# Patient Record
Sex: Female | Born: 1940 | Race: White | Hispanic: No | Marital: Married | Smoking: Former smoker
Health system: Southern US, Community
[De-identification: ages and names within clinical notes are randomized; demographics above are authoritative.]

## PROBLEM LIST (undated history)

## (undated) DIAGNOSIS — I00 Rheumatic fever without heart involvement: Secondary | ICD-10-CM

## (undated) DIAGNOSIS — K219 Gastro-esophageal reflux disease without esophagitis: Secondary | ICD-10-CM

## (undated) DIAGNOSIS — I10 Essential (primary) hypertension: Secondary | ICD-10-CM

## (undated) DIAGNOSIS — Z8679 Personal history of other diseases of the circulatory system: Secondary | ICD-10-CM

## (undated) DIAGNOSIS — M6282 Rhabdomyolysis: Secondary | ICD-10-CM

## (undated) DIAGNOSIS — K851 Biliary acute pancreatitis without necrosis or infection: Secondary | ICD-10-CM

## (undated) DIAGNOSIS — J45909 Unspecified asthma, uncomplicated: Secondary | ICD-10-CM

## (undated) DIAGNOSIS — K8591 Acute pancreatitis with uninfected necrosis, unspecified: Secondary | ICD-10-CM

## (undated) DIAGNOSIS — F32A Depression, unspecified: Secondary | ICD-10-CM

## (undated) DIAGNOSIS — E785 Hyperlipidemia, unspecified: Secondary | ICD-10-CM

## (undated) DIAGNOSIS — I5022 Chronic systolic (congestive) heart failure: Secondary | ICD-10-CM

## (undated) DIAGNOSIS — G629 Polyneuropathy, unspecified: Secondary | ICD-10-CM

## (undated) DIAGNOSIS — F329 Major depressive disorder, single episode, unspecified: Secondary | ICD-10-CM

## (undated) DIAGNOSIS — J449 Chronic obstructive pulmonary disease, unspecified: Secondary | ICD-10-CM

## (undated) DIAGNOSIS — N189 Chronic kidney disease, unspecified: Secondary | ICD-10-CM

## (undated) DIAGNOSIS — I509 Heart failure, unspecified: Secondary | ICD-10-CM

## (undated) DIAGNOSIS — E119 Type 2 diabetes mellitus without complications: Secondary | ICD-10-CM

## (undated) DIAGNOSIS — I4510 Unspecified right bundle-branch block: Secondary | ICD-10-CM

## (undated) DIAGNOSIS — I350 Nonrheumatic aortic (valve) stenosis: Secondary | ICD-10-CM

## (undated) HISTORY — DX: Major depressive disorder, single episode, unspecified: F32.9

## (undated) HISTORY — DX: Hyperlipidemia, unspecified: E78.5

## (undated) HISTORY — DX: Essential (primary) hypertension: I10

## (undated) HISTORY — DX: Acute pancreatitis with uninfected necrosis, unspecified: K85.91

## (undated) HISTORY — DX: Type 2 diabetes mellitus without complications: E11.9

## (undated) HISTORY — DX: Gastro-esophageal reflux disease without esophagitis: K21.9

## (undated) HISTORY — PX: PANCREATIC PSEUDOCYST DRAINAGE: SHX2158

## (undated) HISTORY — DX: Polyneuropathy, unspecified: G62.9

## (undated) HISTORY — DX: Rhabdomyolysis: M62.82

## (undated) HISTORY — DX: Personal history of other diseases of the circulatory system: Z86.79

## (undated) HISTORY — PX: CHOLECYSTECTOMY: SHX55

## (undated) HISTORY — DX: Depression, unspecified: F32.A

## (undated) HISTORY — PX: OTHER SURGICAL HISTORY: SHX169

## (undated) HISTORY — DX: Chronic systolic (congestive) heart failure: I50.22

## (undated) HISTORY — DX: Rheumatic fever without heart involvement: I00

## (undated) HISTORY — DX: Biliary acute pancreatitis without necrosis or infection: K85.10

---

## 2003-07-18 ENCOUNTER — Ambulatory Visit (HOSPITAL_COMMUNITY): Admission: RE | Admit: 2003-07-18 | Discharge: 2003-07-18 | Payer: Self-pay | Admitting: Family Medicine

## 2004-04-17 ENCOUNTER — Inpatient Hospital Stay (HOSPITAL_COMMUNITY): Admission: AD | Admit: 2004-04-17 | Discharge: 2004-04-23 | Payer: Self-pay | Admitting: Family Medicine

## 2004-04-17 ENCOUNTER — Ambulatory Visit: Payer: Self-pay | Admitting: Internal Medicine

## 2004-04-21 HISTORY — PX: ESOPHAGOGASTRODUODENOSCOPY: SHX1529

## 2004-05-18 ENCOUNTER — Ambulatory Visit (HOSPITAL_COMMUNITY): Admission: RE | Admit: 2004-05-18 | Discharge: 2004-05-18 | Payer: Self-pay | Admitting: Family Medicine

## 2004-06-01 ENCOUNTER — Emergency Department (HOSPITAL_COMMUNITY): Admission: EM | Admit: 2004-06-01 | Discharge: 2004-06-01 | Payer: Self-pay | Admitting: Emergency Medicine

## 2004-07-23 ENCOUNTER — Emergency Department (HOSPITAL_COMMUNITY): Admission: EM | Admit: 2004-07-23 | Discharge: 2004-07-23 | Payer: Self-pay | Admitting: Emergency Medicine

## 2004-11-26 ENCOUNTER — Encounter (HOSPITAL_BASED_OUTPATIENT_CLINIC_OR_DEPARTMENT_OTHER): Admission: RE | Admit: 2004-11-26 | Discharge: 2005-02-24 | Payer: Self-pay | Admitting: Surgery

## 2008-05-16 ENCOUNTER — Ambulatory Visit (HOSPITAL_COMMUNITY): Admission: RE | Admit: 2008-05-16 | Discharge: 2008-05-16 | Payer: Self-pay | Admitting: Family Medicine

## 2008-06-04 ENCOUNTER — Ambulatory Visit (HOSPITAL_COMMUNITY): Admission: RE | Admit: 2008-06-04 | Discharge: 2008-06-04 | Payer: Self-pay | Admitting: Family Medicine

## 2008-11-22 ENCOUNTER — Ambulatory Visit (HOSPITAL_COMMUNITY): Admission: RE | Admit: 2008-11-22 | Discharge: 2008-11-22 | Payer: Self-pay | Admitting: Family Medicine

## 2009-10-03 ENCOUNTER — Ambulatory Visit (HOSPITAL_COMMUNITY): Admission: RE | Admit: 2009-10-03 | Discharge: 2009-10-03 | Payer: Self-pay | Admitting: Family Medicine

## 2009-10-03 ENCOUNTER — Inpatient Hospital Stay (HOSPITAL_COMMUNITY): Admission: EM | Admit: 2009-10-03 | Discharge: 2009-10-05 | Payer: Self-pay | Admitting: Emergency Medicine

## 2010-06-27 ENCOUNTER — Encounter: Payer: Self-pay | Admitting: Internal Medicine

## 2010-08-25 LAB — GLUCOSE, CAPILLARY
Glucose-Capillary: 137 mg/dL — ABNORMAL HIGH (ref 70–99)
Glucose-Capillary: 154 mg/dL — ABNORMAL HIGH (ref 70–99)
Glucose-Capillary: 232 mg/dL — ABNORMAL HIGH (ref 70–99)
Glucose-Capillary: 241 mg/dL — ABNORMAL HIGH (ref 70–99)
Glucose-Capillary: 274 mg/dL — ABNORMAL HIGH (ref 70–99)
Glucose-Capillary: 328 mg/dL — ABNORMAL HIGH (ref 70–99)
Glucose-Capillary: 400 mg/dL — ABNORMAL HIGH (ref 70–99)

## 2010-08-25 LAB — BASIC METABOLIC PANEL
BUN: 21 mg/dL (ref 6–23)
CO2: 32 mEq/L (ref 19–32)
Calcium: 8.7 mg/dL (ref 8.4–10.5)
Chloride: 104 mEq/L (ref 96–112)
Creatinine, Ser: 0.59 mg/dL (ref 0.4–1.2)
GFR calc Af Amer: >60 mL/min (ref >60)
GFR calc non Af Amer: >60 mL/min (ref >60)
Glucose, Bld: 133 mg/dL — ABNORMAL HIGH (ref 70–99)
Potassium: 4 mEq/L (ref 3.5–5.1)
Sodium: 141 mEq/L (ref 135–145)

## 2010-08-25 LAB — CBC
HCT: 36.1 % (ref 36.0–46.0)
HCT: 35 % — ABNORMAL LOW (ref 36.0–46.0)
HCT: 35.1 % — ABNORMAL LOW (ref 36.0–46.0)
Hemoglobin: 12 g/dL (ref 12.0–15.0)
Hemoglobin: 11.9 g/dL — ABNORMAL LOW (ref 12.0–15.0)
Hemoglobin: 12 g/dL (ref 12.0–15.0)
MCHC: 33.3 g/dL (ref 30.0–36.0)
MCHC: 34 g/dL (ref 30.0–36.0)
MCV: 81.8 fL (ref 78.0–100.0)
MCV: 80.6 fL (ref 78.0–100.0)
MCV: 80.6 fL (ref 78.0–100.0)
Platelets: 256 10*3/uL (ref 150–400)
Platelets: 216 10*3/uL (ref 150–400)
Platelets: 217 10*3/uL (ref 150–400)
RBC: 4.41 MIL/uL (ref 3.87–5.11)
RBC: 4.34 MIL/uL (ref 3.87–5.11)
RDW: 17.1 % — ABNORMAL HIGH (ref 11.5–15.5)
RDW: 17 % — ABNORMAL HIGH (ref 11.5–15.5)
WBC: 14.9 10*3/uL — ABNORMAL HIGH (ref 4.0–10.5)
WBC: 11.9 10*3/uL — ABNORMAL HIGH (ref 4.0–10.5)
WBC: 9.9 10*3/uL (ref 4.0–10.5)

## 2010-08-25 LAB — COMPREHENSIVE METABOLIC PANEL
ALT: 20 U/L (ref 0–35)
ALT: 22 U/L (ref 0–35)
AST: 16 U/L (ref 0–37)
AST: 17 U/L (ref 0–37)
Albumin: 3.3 g/dL — ABNORMAL LOW (ref 3.5–5.2)
Albumin: 3.4 g/dL — ABNORMAL LOW (ref 3.5–5.2)
Alkaline Phosphatase: 62 U/L (ref 39–117)
Alkaline Phosphatase: 71 U/L (ref 39–117)
BUN: 21 mg/dL (ref 6–23)
CO2: 31 mEq/L (ref 19–32)
CO2: 33 mEq/L — ABNORMAL HIGH (ref 19–32)
Calcium: 9 mg/dL (ref 8.4–10.5)
Chloride: 100 mEq/L (ref 96–112)
Chloride: 104 mEq/L (ref 96–112)
Creatinine, Ser: 0.65 mg/dL (ref 0.4–1.2)
Creatinine, Ser: 0.68 mg/dL (ref 0.4–1.2)
GFR calc Af Amer: >60 mL/min (ref >60)
GFR calc Af Amer: >60 mL/min (ref >60)
GFR calc non Af Amer: >60 mL/min (ref >60)
GFR calc non Af Amer: >60 mL/min (ref >60)
Glucose, Bld: 152 mg/dL — ABNORMAL HIGH (ref 70–99)
Potassium: 4.2 mEq/L (ref 3.5–5.1)
Potassium: 4.5 mEq/L (ref 3.5–5.1)
Sodium: 138 mEq/L (ref 135–145)
Total Bilirubin: 0.4 mg/dL (ref 0.3–1.2)
Total Bilirubin: 0.5 mg/dL (ref 0.3–1.2)
Total Protein: 7.4 g/dL (ref 6.0–8.3)

## 2010-08-25 LAB — BLOOD GAS, ARTERIAL
Acid-Base Excess: 2.9 mmol/L — ABNORMAL HIGH (ref 0.0–2.0)
TCO2: 24.2 mmol/L (ref 0–100)
pCO2 arterial: 46.1 mmHg — ABNORMAL HIGH (ref 35.0–45.0)
pO2, Arterial: 74.1 mmHg — ABNORMAL LOW (ref 80.0–100.0)

## 2010-08-25 LAB — DIFFERENTIAL
Basophils Absolute: 0 10*3/uL (ref 0.0–0.1)
Basophils Absolute: 0 10*3/uL (ref 0.0–0.1)
Basophils Absolute: 0 10*3/uL (ref 0.0–0.1)
Basophils Relative: 0 % (ref 0–1)
Basophils Relative: 0 % (ref 0–1)
Basophils Relative: 0 % (ref 0–1)
Eosinophils Absolute: 0 10*3/uL (ref 0.0–0.7)
Eosinophils Absolute: 0 10*3/uL (ref 0.0–0.7)
Eosinophils Absolute: 0 10*3/uL (ref 0.0–0.7)
Eosinophils Relative: 0 % (ref 0–5)
Eosinophils Relative: 0 % (ref 0–5)
Eosinophils Relative: 0 % (ref 0–5)
Lymphocytes Relative: 5 % — ABNORMAL LOW (ref 12–46)
Lymphocytes Relative: 15 % (ref 12–46)
Lymphocytes Relative: 7 % — ABNORMAL LOW (ref 12–46)
Lymphs Abs: 0.8 10*3/uL (ref 0.7–4.0)
Lymphs Abs: 1.7 10*3/uL (ref 0.7–4.0)
Monocytes Absolute: 0.4 10*3/uL (ref 0.1–1.0)
Monocytes Absolute: 0.2 10*3/uL (ref 0.1–1.0)
Monocytes Absolute: 0.7 10*3/uL (ref 0.1–1.0)
Monocytes Relative: 3 % (ref 3–12)
Monocytes Relative: 6 % (ref 3–12)
Neutro Abs: 13.8 10*3/uL — ABNORMAL HIGH (ref 1.7–7.7)
Neutro Abs: 9.4 10*3/uL — ABNORMAL HIGH (ref 1.7–7.7)
Neutrophils Relative %: 92 % — ABNORMAL HIGH (ref 43–77)
Neutrophils Relative %: 79 % — ABNORMAL HIGH (ref 43–77)

## 2010-08-25 LAB — CK TOTAL AND CKMB (NOT AT ARMC)
CK, MB: 2.1 ng/mL (ref 0.3–4.0)
CK, MB: 5.7 ng/mL — ABNORMAL HIGH (ref 0.3–4.0)
Relative Index: INVALID (ref 0.0–2.5)
Total CK: 95 U/L (ref 7–177)

## 2010-08-25 LAB — BRAIN NATRIURETIC PEPTIDE: Pro B Natriuretic peptide (BNP): 114 pg/mL — ABNORMAL HIGH (ref 0.0–100.0)

## 2010-08-25 LAB — TROPONIN I
Troponin I: 0.1 ng/mL — ABNORMAL HIGH (ref 0.00–0.06)
Troponin I: 0.2 ng/mL — ABNORMAL HIGH (ref 0.00–0.06)

## 2010-08-25 LAB — PROTIME-INR
INR: 1.02 (ref 0.00–1.49)
Prothrombin Time: 13.3 seconds (ref 11.6–15.2)
Prothrombin Time: 14.1 seconds (ref 11.6–15.2)

## 2010-08-25 LAB — APTT: aPTT: 27 seconds (ref 24–37)

## 2010-08-25 LAB — CARDIAC PANEL(CRET KIN+CKTOT+MB+TROPI)
CK, MB: 6.1 ng/mL (ref 0.3–4.0)
CK, MB: 9.7 ng/mL (ref 0.3–4.0)
Total CK: 291 U/L — ABNORMAL HIGH (ref 7–177)
Total CK: 542 U/L — ABNORMAL HIGH (ref 7–177)

## 2010-08-25 LAB — LIPID PANEL
HDL: 51 mg/dL (ref >39)
Total CHOL/HDL Ratio: 2.8 RATIO
Triglycerides: 138 mg/dL (ref <150)
VLDL: 28 mg/dL (ref 0–40)

## 2010-08-25 LAB — D-DIMER, QUANTITATIVE: D-Dimer, Quant: 0.44 ug/mL-FEU (ref 0.00–0.48)

## 2010-08-25 LAB — POCT CARDIAC MARKERS

## 2010-10-23 NOTE — Consult Note (Signed)
NAMEREGENIA, ERCK               ACCOUNT NO.:  [id number]   MEDICAL RECORD NO.:  [id number]           PATIENT TYPE:   LOCATION:                                 FACILITY:   PHYSICIAN:  Theresia Majors. Tanda Rockers, M.D.DATE OF BIRTH:  Jan 28, 1941   DATE OF CONSULTATION:  11/27/2004  DATE OF DISCHARGE:                                   CONSULTATION   REASON FOR CONSULTATION:  Kathryn Morgan is a 70 year old female referred by  Dr. Regino Schultze for evaluation of a nonhealing ulcer of her right buttock.   IMPRESSION:  A healing stasis ulcer, right buttock with a sinus tract.   RECOMMENDATION/PROCEDURE:  An incision opening and debridement of the sinus  tract was performed in the wound clinic and a packing of Iodoform gauze was  performed.  The daughter was instructed in wound care.  The patient will be  followed in the wound care clinic in 5-7 days.   SUBJECTIVE:  Kathryn Morgan is a 70 year old lady, who has had multiple medical  problems of extreme severity over the past 8 months.  She initially had an  episode of biliary colic which became complicated by pancreatitis, sepsis,  and multisystems organ failure requiring prolonged hospitalization and  ventilatory support.  She also experienced acute renal failure requiring  temporary dialysis.  During the course of these grave illnesses, she was in  the fore supine position and developed a decubitus ulcer.  Following her  partial recovery and return to outpatient status, she had her hospital-  acquired decubitus ulcer managed by the home health nurse.  This wound  healed nicely but has now been associated with a sinus tract.   PAST MEDICAL HISTORY:  Remarkable for diabetes.  She denies allergies. Her.   CURRENT MEDICATIONS:  1.  Oxygen administration daily and continuously.  2.  Zoloft 100 mg daily.  3.  Reglan 5 mg q.12h.  4.  Metoprolol 75 mg b.i.d.  5.  Mirtazapine 15 mg daily.  6.  Lantus 12 units h.s.  7.  Colace 100 mg b.i.d.  8.  Cardizem 120  mg daily.  9.  Lisinopril 5 mg daily.  10. __________ 4 mg q.6h.  11. Xanax 0.5 mg q.6h.   PAST SURGERIES:  Procedures related to her recent illness.  Pancreatic  stents have been placed, and she has had a percutaneous drainage of a  pancreatic pseudocyst.   FAMILY HISTORY:  Positive for hypertension, diabetes.   SOCIAL HISTORY:  She is married.  She has two children and lives in  [place].   REVIEW OF SYSTEMS:  Related to her current convalescence from the  multisystems organ failure.  Prior to this episode, she was in good health  with no ongoing illnesses except for diabetes.   PHYSICAL EXAM:  GENERAL:  She is alert, pleasant female in no acute  distress.  She has continuous administration of nasal oxygen by prongs.  LUNGS:  Clear.  ABDOMEN:  Soft.  CARDIAC:  The heart sounds were distant.  EXTREMITIES:  Warm on the buttocks.  There is a sinus tract on the medial  aspect of the right buttock.  This extends down 4 cm and is associated with  some serosanguineous drainage on a Q-tip.  NEUROLOGIC:  The patient's protective sensation is intact.   DISCUSSION:  The sinus tract was opened and packing initiated.  We  anticipate that this should heal without complication.  We will follow the  patient in [facility] to achieve resolution.           ______________________________  Theresia Majors Tanda Rockers, M.D.     Cephus Slater  D:  11/27/2004  T:  11/28/2004  Job:  [id number]   cc:   [name], M.D.  [address]  [place]  [place] [address]  Fax: [phone]

## 2010-10-23 NOTE — Discharge Summary (Signed)
NAMEKELBY, Kathryn Morgan               ACCOUNT NO.:  [id number]   MEDICAL RECORD NO.:  [id number]          PATIENT TYPE:  INP   LOCATION:  A312                          FACILITY:  [facility]   PHYSICIAN:  [name], M.D.DATE OF BIRTH:  1940-06-29   DATE OF ADMISSION:  04/17/2004  DATE OF DISCHARGE:  11/18/2005LH                                 DISCHARGE SUMMARY   DISCHARGE DIAGNOSES:  1.  Retractable vomiting.  2.  Pancreatitis with enlarging pseudocyst.  3.  Cholelithiasis, symptomatic.  4.  Type 2 diabetes.  5.  Tachycardia.  6.  Intensive care unit neuropathy, lower extremities.  7.  Deconditioning.  8.  History of anemia.  9.  Status post ventilator dependency.  10. Status post renal failure secondary to rhabdomyolysis.  11. History of coronary artery disease.   HOSPITAL COURSE:  This 70 year old female was admitted in transfer from  [facility] after an arduous course which began in [place] on an  airplane with abdominal pain.  The patient underwent hospitalization for  pancreatitis with subsequent ventilator dependency, rhabdomyolysis and renal  failure.  She was weaned off the ventilator several weeks ago, but has been  having difficult tolerating p.o. without vomiting.  She is noted to have  symptomatic gallstones as well as an enlarging pseudocyst.  For this reason,  she was admitted.  The patient was found to have a white count of 8400 with  a hemoglobin 9.7.  Electrolytes showed a slightly low sodium of 131 on  admission, 134 at the time of discharge, otherwise potassium, BUN and  creatinine have been within normal limits.  The patient's blood sugars have  been difficult to control due to the fact that early on she was getting D-5-  1/2 normal in her IV and her TPN has been increased.  Currently, she is  receiving 36 units of Lantus with sliding scale.  Blood sugar on the morning  of admission was 400.  Her TPN was increased yesterday.  Recommend  significant  increase in her Lantus and continue sliding scale coverage.   The patient's remaining labs showed an elevated Alk phos throughout her stay  with 229 on admission and 218 at discharge.  Her SGOT was elevated on  admission at 61 and is normal now at 32.  SGPT was slightly elevated at 49,  now normal at 3.5.  Protein was within normal range at 6.1 at the time of  discharge.  Albumin is low at 2.1.  The patient's amylase and lipase were  normal on admission.  Cholesterol was 151 on admission with triglycerides of  326 and has improved to 244 now.   The patient has remained afebrile throughout her stay.  She has had some  episodes of tachycardia related to inability to tolerate her p.o.  medications which include Lopressor.  The patient was seen by Dr. Lovell Sheehan  for surgery as well as GI.  GI attempted to do a transgastric drainage of  her pseudocyst without success.  For that reason, she will be transferred to  [facility] for definitive drainage of that cyst.  Hopefully,  correction of her gallbladder disease, also.  The patient's CT of the  abdomen showed left effusion, a lesion in her left mid lung zone.  Followup  CT scan of that was consistent with post inflammatory changes.  She does  have loculated fluid in her pancreas area and cholelithiasis with ductal  dilatation.  CT of the pelvis was negative.   The patient's respiratory status has been stable throughout her stay.  She  is still having difficulty tolerating p.o.  She is transferred now to  [facility].     Will   WMM/MEDQ  D:  04/23/2004  T:  04/23/2004  Job:  [id number]

## 2010-10-23 NOTE — Consult Note (Signed)
NAMEJARED, CAHN               ACCOUNT NO.:  [id number]   MEDICAL RECORD NO.:  [id number]          PATIENT TYPE:  INP   LOCATION:  A312                          FACILITY:  [facility]   PHYSICIAN:  R. Roetta Sessions, M.D. DATE OF BIRTH:  1940-09-27   DATE OF CONSULTATION:  04/20/2004  DATE OF DISCHARGE:                                   CONSULTATION   REASON FOR CONSULTATION:  Pseudocyst.   HISTORY OF PRESENT ILLNESS:  Ms. Top is a 70 year old Caucasian female  who developed acute pancreatitis around August 19 while she was on vacation.  She was admitted to the hospital in [place] secondary to developing  pancreatitis.  Her course was complicated by renal failure, rhabdomyolysis,  and acute respiratory failure.  She ended up on the ventilator.  Per her  request, she was transferred to [facility] in [place] in mid  October.  She continued to complain of nausea, vomiting, and abdominal pain.  She was found to have pancreatic pseudocyst.  CT scan from April 18, 2004, revealed extensive loculated fluid in left upper quadrant lesser sac  and along the anterior perirenal face.  She was also found to have  cholelithiasis and a mild intrahepatic central biliary ductal dilatation.  She also was noted to have mild splenomegaly.   Today she is afebrile.  She denies any abdominal pain at this time.  She  does have persistent nausea and intermittent vomiting.  She denies any  constipation or diarrhea.  She denies any heartburn, indigestion, or  dyspepsia.   PAST MEDICAL HISTORY:  1.  Diabetes mellitus.  2.  Acute renal failure which is now resolved.  3.  Coronary artery disease.  4.  Anemia, ICU.  5.  Neuropathy.  6.  Acute gallstone pancreatitis January 24, 2004.  7.  Rhabdomyolysis.  8.  Acute respiratory failure.   MEDICATIONS PRIOR TO ADMISSION:  1.  Lantus 20 units daily.  2.  Atrovent nebulizer q.12h. p.r.n.  3.  Lasix daily.  4.  Zofran p.r.n. nausea.  5.   Clonazepam 0.125 mg p.r.n.  6.  Stool softener as needed.  7.  Zoloft 25 mg daily.  8.  Sorbic acid daily.  9.  Enalapril 5 mg b.i.d.  10. Prevacid 30 mg daily.  11. Thiamine 100 mg daily.  12. Metoprolol 100 mg b.i.d.  13. Clonidine 0.3 mg q.8h.  14. Cardizem 60 mg q.6h.  15. TPN   ALLERGIES:  CODEINE.   FAMILY HISTORY:  No known family history of colorectal carcinoma, liver or  chronic GI problems.  Mother, age 73, alive with history of osteoporosis.  Father deceased at age 90 secondary to CVA.  She has one brother and one  sister, both of whom are relatively healthy.   SOCIAL HISTORY:  Ms. Miggins has been married for 36 years.  She reports a  15-pack-year tobacco use history, quitting 15 years ago.  She reported  occasional glass of wine prior to hospitalization.  She has two grown and  healthy children.  She denied any drug use.  She is a retired Investment banker, corporate.  REVIEW OF SYSTEMS:  CONSTITUTIONAL:  Weight stabilized since her acute  illness.  She denies any fever or chills.  Appetite has been somewhat  decreased given her symptoms.  CARDIOVASCULAR:  Denies chest pain or  palpitations.  LUNGS:  Denies shortness of breath, dyspnea, cough,  hemoptysis.  GI:  Denies any dysphagia or odynophagia.   PHYSICAL EXAMINATION:  VITAL SIGNS:  Weight 175.6 pounds, height 69.  Temperature 97.2, pulse 99, respirations 20, blood pressure 144/69.  GENERAL:  Ms. Glaspy is a pale-appearing Caucasian female who is alert and  oriented, pleasant, cooperative, and in no acute distress, although appears  weak.  She is accompanied by her daughter today.  HEENT:  Sclerae clear, nonicteric.  Conjunctivae pale.  Oropharynx pink and  moist without any lesion.  NECK:  Supple without thyromegaly.  HEART:  Regular rate and rhythm with normal S1 and S2.  LUNGS:  Clear bilaterally.  ABDOMEN:  Positive bowel sounds x 4.  No bruits auscultated.  Soft, mildly  distended, nontender, and without palpable mass  or hepatosplenomegaly.  No  rebound tenderness or guarding.  GU:  She does have Foley intact with clear yellow urine to straight drain.  EXTREMITIES:  1+ pedal pulses bilaterally.  Trace edema bilaterally.   LABORATORY AND X-RAY DATA:  I's and O's +431.  WBC 9.6, hemoglobin 9.6,  hematocrit 28.3, platelets 400.  Calcium 9.3, sodium 131, potassium 4.4,  chloride 102, CO2 25, BUN 9, creatinine 0.4, glucose 284, total bilirubin  0.5, alkaline phosphatase 224, SGOT 47, SGPT 43, total protein 6.4, albumin  2.1, phosphorus 4.4, magnesium 1.6, cholesterol 151, triglycerides 326,  amylase 73, lipase 29.   CT scan as dictated in HPI.   IMPRESSION:  Ms. Skilton is a 70 year old Caucasian female with complicated  history due to acute gallstone pancreatitis.  She has now developed  significant pseudocyst which will need to be drained.  Dr. Jena Gauss has  reviewed CT scan and will plan for endoscopic transgastric pseudocyst  drainage under fluoroscopy tomorrow.  This has been discussed with Ms.  Sibyl Parr, and Dr. Jena Gauss will also visit her this evening as well.   RECOMMENDATIONS:  1.  Plan for endoscopy transgastric pseudocyst drainage under fluoroscopy      tomorrow by Dr. Jena Gauss.  Will cover      her with antibiotics, Levaquin 500 mg IV preoperatively.  2.  Continue current medications for now.  3.  Further recommendations pending procedure.     Kand   KC/MEDQ  D:  04/20/2004  T:  04/20/2004  Job:  [id number]   cc:   [name], M.D.  [address]  [place]  [place] [address]  Fax: [phone]

## 2010-10-23 NOTE — Procedures (Signed)
Kathryn Morgan, Kathryn Morgan               ACCOUNT NO.:  [id number]   MEDICAL RECORD NO.:  [id number]          PATIENT TYPE:  OUT   LOCATION:  RESP                          FACILITY:  [facility]   PHYSICIAN:  Edward L. Juanetta Gosling, M.D.DATE OF BIRTH:  08/17/1940   DATE OF PROCEDURE:  DATE OF DISCHARGE:  06/04/2008                            PULMONARY FUNCTION TEST   1. Spirometry shows a severe ventilatory defect with evidence of      airflow obstruction.  2. Lung volumes show no restrictive abnormality, but do show air      trapping.  3. DLCO is moderately reduced.  4. Arterial blood gases show normal oxygenation and a slightly      elevated pCO2 suggesting chronic hypoventilation.  5. There is no significant bronchodilator improvement.      Edward L. Juanetta Gosling, M.D.  Electronically Signed     ELH/MEDQ  D:  06/06/2008  T:  06/06/2008  Job:  [id number]   cc:   Patrica Duel, M.D.  Fax: [phone]

## 2010-10-23 NOTE — Op Note (Signed)
Kathryn Morgan, Kathryn Morgan               ACCOUNT NO.:  [id number]   MEDICAL RECORD NO.:  [id number]          PATIENT TYPE:  INP   LOCATION:  A312                          FACILITY:  [facility]   PHYSICIAN:  R. Roetta Sessions, M.D. DATE OF BIRTH:  12-17-1940   DATE OF PROCEDURE:  04/21/2004  DATE OF DISCHARGE:                                 OPERATIVE REPORT   PROCEDURE:  Diagnostic esophagogastroduodenoscopy.   INDICATION FOR PROCEDURE:  The patient is a 70 year old lady with a long,  protracted course of complicated pancreatitis.  She has now recovered  significantly.  She has had postprandial nausea and vomiting.  CT  demonstrated a large pseudocyst impinging on the stomach inferiorly and  posteriorly.  We discussed the approach of EGD with possible transgastric  drainage/stent placement.  I have discussed the approach with Dr. Wallis Bamberg of [facility] and discussed the potential risks, benefits, and  alternatives with Kathryn Morgan.  The potential for failed procedure was fully  explained.  All parties were agreeable.  Please see the documentation in the  medical record.   PROCEDURE NOTE:  O2 saturation, blood pressure, pulse, and respirations were  monitored throughout the entire procedure.   CONSCIOUS SEDATION:  IV Versed and Demerol in incremental doses.   INSTRUMENT USED:  Olympus video chip system.   FINDINGS:  Examination of the tubular esophagus revealed no mucosal  abnormalities.  The EG junction was easily traversed.  The stomach  insufflated well with air.  Surprisingly, there was not a significant bulge  on gastric wall.  There was a subtle-appearing bulge on the posterior lesser  curvature; however, the stomach even there insufflated fairly well, and  there was no obvious location for a gastrotomy.  Overlying mucosa appeared  normal.  We turned the patient from semiprone to left lateral decubitus to  supine and really did not get a change in the perspective of the  pseudocyst  in relation to the stomach.  Pylorus was patent and easily traversed.  Examination of the bulb and second portion revealed no abnormalities.   I did pull the scope back into the stomach and advance the sclero needle  through the area where there was a subtle fullness in the gastric wall and  was unable to aspirate anything.  I elected not to make a blind gastrotomy  given there was not more of a frank bulge.  The patient tolerated the  procedure very well, will react in endoscopy.   IMPRESSION:  1.  Normal esophagus.  2.  Normal gastric mucosa, subtle fullness of the gastric wall as described      above suggestive of the presence of a pseudocyst but certainly not      conclusive for that being the location of the pseudocyst in relation to      the stomach.  3.  Normal D1, D2.   RECOMMENDATIONS:  Will make plans to have the patient go over to [facility] so  she can have an EUS-guided transgastric pseudocyst drainage procedure.  I  have discussed the case with Dr. Wallis Bamberg and Dr. Lovell Sheehan  here.  Will  most likely get her transferred over tomorrow.  Will be discussing this in  more detail with patient  and family in the near future.     Otelia Sergeant   RMR/MEDQ  D:  04/21/2004  T:  04/21/2004  Job:  [id number]   cc:   [name], M.D.  [address]  [place]  [place] [address]  Fax: [phone]   Dalia Heading, M.D.  [address], [place]  [place] [address]  Fax: [phone]

## 2010-10-23 NOTE — H&P (Signed)
NAMEKASHMIR, Kathryn Morgan               ACCOUNT NO.:  [id number]   MEDICAL RECORD NO.:  [id number]          PATIENT TYPE:  INP   LOCATION:  A312                          FACILITY:  [facility]   PHYSICIAN:  [name], M.D.DATE OF BIRTH:  1941-03-26   DATE OF ADMISSION:  04/17/2004  DATE OF DISCHARGE:  LH                                HISTORY & PHYSICAL   CHIEF COMPLAINT:  Vomiting, abdominal pain.   PRESENTING ILLNESS:  This is a 70 year old lady who has had a long, arduous  course with her illness.  The patient was vacationing in [place] on  August 19 of this year.  She was admitted to an [facility] with  abdominal pain.  At that time she was found to have acute pancreatitis.  Her  condition continued to worsen.  She developed renal failure, rhabdomyolysis,  and respiratory failure.  The patient was placed on mechanical ventilator  and had a very slow course weaning.  Her renal failure recovered, but she  continued to have difficulty on the vent.  She was transferred to [facility] for ventilator support on October 16.  While at [facility],  she had been weaned off the ventilator.  She was tolerating a regular diet  until approximately a week ago, began having nausea and vomiting and  abdominal pain associated with eating.  TPN was instituted at this time, and  current workup showed enlarging pancreatic pseudocyst as well as thickening  of the gallbladder with multiple stones.  The patient is now transferred to  [facility] for definitive surgery at her request to be closer to home.   The patient's medications at this time are:  1.  Lantus insulin 20 units daily.  2.  She is on ipratropium nebulizer q.12h. p.r.n.  3.  Albuterol nebulizer a.12h. p.r.n.  4.  Lasix 20 mg daily.  5.  Zofran IV for nausea.  6.  Clonazepam 0.25 mg for anxiety p.r.n.  7.  Stool softener.  8.  Zoloft 25 mg daily.  9.  Ascorbic acid.  10. Enalapril 5 mg b.i.d.  11. Prevacid 30 mg  daily.  12. Thiamine 100 mg daily.  13. Metoprolol 100 mg b.i.d.  14. Clonidine 0.3 mg q.8h.  15. Cardizem 60 mg q.6h.  16. She gets TPN at 84 mL/hr.  17. Innohep 13 units daily.   REVIEW OF SYSTEMS:  She denies significant shortness of breath.  She denies  chest pain.  At this time she denies any abdominal pain, nausea or vomiting  or diarrhea.  The patient is taking her medications p.o. with ice chips and  sips without problems.   PHYSICAL EXAMINATION:  GENERAL:  A well-developed female, alert and  oriented.  VITAL SIGNS:  She is afebrile, her pulse is 90 and regular, respirations 20  and unlabored, blood pressure is 130/60.  HEENT:  TMs normal.  Pupils equal and react to light and accommodation.  Oropharynx benign.  NECK:  Supple without JVD, bruit, or thyromegaly.  CHEST:  Lungs essentially clear in all areas.  CARDIAC:  Regular sinus rhythm without  murmur, gallop, or rub.  ABDOMEN:  Soft, nontender, with normoactive bowel sounds.  EXTREMITIES:  Without clubbing, cyanosis, or edema.  There is a small  healing ulcer in her left heel.  NEUROLOGIC:  Significant diminished strength down her lower extremities  bilaterally.   ASSESSMENT:  1.  Status post ventilator dependency.  2.  Type 2 diabetes.  3.  Chronic recurrent pancreatitis with enlarging pseudocyst.  4.  Cholelithiasis, symptomatic.  5.  Status post renal failure.  6.  History of coronary artery disease.  7.  History of anemia.  8.  Intensive care unit neuropathy.  9.  Deconditioning.     Will   WMM/MEDQ  D:  04/18/2004  T:  04/18/2004  Job:  [id number]

## 2011-03-12 LAB — BLOOD GAS, ARTERIAL
Bicarbonate: 28.9 mEq/L — ABNORMAL HIGH (ref 20.0–24.0)
O2 Content: 21 L/min
TCO2: 25.4 mmol/L (ref 0–100)
pH, Arterial: 7.389 (ref 7.350–7.400)
pO2, Arterial: 74.2 mmHg — ABNORMAL LOW (ref 80.0–100.0)

## 2011-05-07 ENCOUNTER — Other Ambulatory Visit (HOSPITAL_COMMUNITY): Payer: Self-pay | Admitting: Family Medicine

## 2011-05-07 DIAGNOSIS — Z139 Encounter for screening, unspecified: Secondary | ICD-10-CM

## 2011-05-14 ENCOUNTER — Other Ambulatory Visit (HOSPITAL_COMMUNITY): Payer: Self-pay

## 2011-05-14 ENCOUNTER — Ambulatory Visit (HOSPITAL_COMMUNITY): Payer: Self-pay

## 2011-05-17 ENCOUNTER — Encounter: Payer: Self-pay | Admitting: Internal Medicine

## 2011-05-18 ENCOUNTER — Ambulatory Visit (INDEPENDENT_AMBULATORY_CARE_PROVIDER_SITE_OTHER): Payer: Medicare Other | Admitting: Gastroenterology

## 2011-05-18 ENCOUNTER — Encounter: Payer: Self-pay | Admitting: Gastroenterology

## 2011-05-18 DIAGNOSIS — K863 Pseudocyst of pancreas: Secondary | ICD-10-CM

## 2011-05-18 DIAGNOSIS — Z1211 Encounter for screening for malignant neoplasm of colon: Secondary | ICD-10-CM

## 2011-05-18 DIAGNOSIS — B1920 Unspecified viral hepatitis C without hepatic coma: Secondary | ICD-10-CM

## 2011-05-18 DIAGNOSIS — R1013 Epigastric pain: Secondary | ICD-10-CM

## 2011-05-18 LAB — CBC WITH DIFFERENTIAL/PLATELET
Hemoglobin: 12 g/dL (ref 12.0–15.0)
Lymphocytes Relative: 22 % (ref 12–46)
Lymphs Abs: 1.8 10*3/uL (ref 0.7–4.0)
Monocytes Relative: 8 % (ref 3–12)
Neutro Abs: 5.7 10*3/uL (ref 1.7–7.7)
Neutrophils Relative %: 69 % (ref 43–77)
Platelets: 268 10*3/uL (ref 150–400)
RBC: 4.98 MIL/uL (ref 3.87–5.11)
WBC: 8.3 10*3/uL (ref 4.0–10.5)

## 2011-05-18 LAB — HEPATIC FUNCTION PANEL
Albumin: 3.4 g/dL — ABNORMAL LOW (ref 3.5–5.2)
Alkaline Phosphatase: 67 U/L (ref 39–117)
Bilirubin, Direct: 0.1 mg/dL (ref 0.0–0.3)
Indirect Bilirubin: 0.5 mg/dL (ref 0.0–0.9)
Total Bilirubin: 0.6 mg/dL (ref 0.3–1.2)

## 2011-05-18 MED ORDER — PANTOPRAZOLE SODIUM 40 MG PO TBEC: 40.0000 mg | DELAYED_RELEASE_TABLET | Freq: Every day | ORAL | Status: DC

## 2011-05-18 NOTE — Progress Notes (Signed)
Primary Care Physician:  [name], MD Primary Gastroenterologist:  Dr. Jena Gauss  Chief Complaint  Patient presents with  . Gas    alot of pressure/no reflux    HPI:   Kathryn Morgan is a 70 year old female who presents as a self-referral today secondary to abdominal pressure, burping. She was seen by [place] in 2005 secondary to a pancreatic pseudocyst. Attempt at transgastric drainage/stent placement was undertaken; however, there was no obvious site to perform the gastrotomy. She was subsequently transferred to [facility]. I do not have any of the records from [place] at this time. Kathryn Morgan reports a several week history of increased belching, epigastric "band-like" tightness, bloating. Becoming more frequent and steady over past several weeks. Denies any reflux symptoms. No dysphagia. She has taken Beano, Gas-X, Tums. Baking soda seemed to help slightly. She complains of early satiety. She has been on Prilosec for years. No NSAIDs or aspirin powders.   She also reports a weight loss upwards of 30 lbs since February of this year, when she was started on Daliresp. Denies any melena or hematochezia. Reports intermittent loose stools with greasy foods. Intermittent lower abdominal discomfort, unrelated to BM. Feels "full". No prior colonoscopy.   Past Medical History  Diagnosis Date  . DM (diabetes mellitus)   . HTN (hypertension)   . Dyslipidemia   . Hepatitis C     per pt, hx of  . Depression   . Necrotizing pancreatitis   . CAD (coronary artery disease)   . Neuropathy   . Respiratory failure, acute   . Rhabdomyolysis   . Gallstone pancreatitis     2005    Past Surgical History  Procedure Date  . Esophagogastroduodenoscopy 04/21/04    normal esophagus/normal D1,D2; attempted  transgastric drainage/stent placement of pancreatic pseudocyst, no obvious location for gastrotomy, therefore transferred to [facility]  . Debridement pancreas   . Pancreatic pseudocyst drainage   .  Cholecystectomy     Current Outpatient Prescriptions  Medication Sig Dispense Refill  . docusate sodium (COLACE) 100 MG capsule Take 100 mg by mouth 2 (two) times daily.        . Fluticasone-Salmeterol (ADVAIR DISKUS) 250-50 MCG/DOSE AEPB Inhale 1 puff into the lungs every 12 (twelve) hours.        . furosemide (LASIX) 20 MG tablet Take 20 mg by mouth 2 (two) times daily.        . insulin regular (NOVOLIN R,HUMULIN R) 100 units/mL injection Inject into the skin. Insulin pump 2.5 units every hours       . LORazepam (ATIVAN) 1 MG tablet Take 1 mg by mouth every 8 (eight) hours.        . metoprolol (LOPRESSOR) 50 MG tablet Take 50 mg by mouth 2 (two) times daily. 1/2 tablet bid       . roflumilast (DALIRESP) 500 MCG TABS tablet Take 500 mcg by mouth daily.        . simvastatin (ZOCOR) 20 MG tablet Take 20 mg by mouth at bedtime.        Marland Kitchen tiotropium (SPIRIVA) 18 MCG inhalation capsule Place 18 mcg into inhaler and inhale daily.        Marland Kitchen aspirin 81 MG tablet Take 81 mg by mouth daily.        Marland Kitchen diltiazem (DILACOR XR) 240 MG 24 hr capsule Take 240 mg by mouth daily.        . fish oil-omega-3 fatty acids 1000 MG capsule Take 2 g by mouth daily.        Marland Kitchen  losartan (COZAAR) 100 MG tablet Take 100 mg by mouth daily.        . metFORMIN (GLUMETZA) 500 MG (MOD) 24 hr tablet Take 500 mg by mouth daily with breakfast.        . pantoprazole (PROTONIX) 40 MG tablet Take 1 tablet (40 mg total) by mouth daily. Take 30 minutes before breakfast  30 tablet  1    Allergies as of 05/18/2011 - never reviewed  Allergen Reaction Noted  . Codeine Other (See Comments) 05/18/2011    Family History  Problem Relation Age of Onset  . Colon cancer Neg Hx     History   Social History  . Marital Status: Married    Spouse Name: N/A    Number of Children: N/A  . Years of Education: N/A   Occupational History  . Not on file.   Social History Main Topics  . Smoking status: Former Smoker -- 1.0 packs/day    Types:  Cigarettes  . Smokeless tobacco: Not on file   Comment: quit about 20+ yrs ago  . Alcohol Use: No  . Drug Use: No  . Sexually Active: Not on file   Other Topics Concern  . Not on file   Social History Narrative  . No narrative on file    Review of Systems: Gen: Denies any fever, chills, fatigue, weight loss, + lack of appetite.  CV: Denies chest pain, heart palpitations, peripheral edema, syncope.  Resp: Denies shortness of breath at rest or with exertion. Denies wheezing or cough.  GI: Denies dysphagia or odynophagia. Denies jaundice, hematemesis, fecal incontinence. GU : Denies urinary burning, urinary frequency, urinary hesitancy MS: Denies joint pain, muscle weakness, cramps, or limitation of movement.  Derm: Denies rash, itching, dry skin Psych: Denies depression, anxiety, memory loss, and confusion Heme: Denies bruising, bleeding, and enlarged lymph nodes.  Physical Exam: BP 140/87  Pulse 77  Temp(Src) 98.6 F (37 C) (Temporal)  Ht 5\' 4"  (1.626 m)  Wt 212 lb (96.163 kg)  BMI 36.39 kg/m2 General:   Alert and oriented. Pleasant and cooperative. Well-nourished and well-developed.  Head:  Normocephalic and atraumatic. Eyes:  Without icterus, sclera clear and conjunctiva pink.  Ears:  Normal auditory acuity. Nose:  No deformity, discharge,  or lesions. Mouth:  No deformity or lesions, oral mucosa pink.  Neck:  Supple, without mass or thyromegaly. Lungs:  Clear to auscultation bilaterally. No wheezes, rales, or rhonchi. No distress.  Heart:  S1, S2 present without murmurs appreciated.  Abdomen:  +BS, soft, TTP RUQ and epigastric region and non-distended. No HSM noted. No guarding or rebound. No masses appreciated.  Rectal:  Deferred  Msk:  Symmetrical without gross deformities. Normal posture. Extremities:  Trace edema bilateral lower extremities Neurologic:  Alert and  oriented x4;  grossly normal neurologically. Skin:  Intact without significant lesions or  rashes. Cervical Nodes:  No significant cervical adenopathy. Psych:  Alert and cooperative. Normal mood and affect.

## 2011-05-18 NOTE — Patient Instructions (Signed)
Please have labs completed. We will be calling you with the results. Depending on the results, we may be proceeding with an Ultrasound of your abdomen. For right now, we will just do the blood work and proceed as follows:  Endoscopy and Colonoscopy in the near future with Dr. Jena Gauss.  Diabetes instructions: No oral diabetes medication the day of procedure                                     As you will not be eating after midnight, stop pump at midnight.                                        Start taking Protonix 30 minutes before breakfast daily. This has been sent to your pharmacy. Stop taking omeprazole.   Start taking a probiotic daily. Samples have been provided for you.  Please review the lactose-free diet and gas diet in the meantime. Try to avoid the foods that are listed on these as causing issues.  We have referred you to an endocrinologist for further help managing your diabetes.   Further recommendations to follow soon.

## 2011-05-19 ENCOUNTER — Encounter: Payer: Self-pay | Admitting: Gastroenterology

## 2011-05-19 NOTE — Assessment & Plan Note (Signed)
No prior colonoscopy. Needs lower GI evaluation at time of EGD.   Proceed with TCS with Dr. Jena Gauss in near future: the risks, benefits, and alternatives have been discussed with the patient in detail. The patient states understanding and desires to proceed.

## 2011-05-19 NOTE — Assessment & Plan Note (Signed)
70 year old female with new-onset dyspepsia to include early satiety, epigastric pain. Also complains of burping, no dysphagia. Wt loss noted of upwards 30 lbs since Feb of this year; however, this has been since starting Daliresp. Has been on Prilosec for years. Last EGD was in 2005 at time of pancreatic pseudocyst as described above. Needs further UGI evaluation as well as baseline labs. We need to also retrieve reports from [facility] for our records.  Proceed with upper endoscopy in the near future with Dr. Jena Gauss. The risks, benefits, and alternatives have been discussed in detail with patient. They have stated understanding and desire to proceed.  She has an insulin pump, which has been discussed in detail with pt. We will have pt stop insulin at [place]. She will be scheduled early in the morning due to hx of diabetes and pump.  Continue Prilosec CBC, HFP, celiac panel, Lipase, HFP

## 2011-05-19 NOTE — Assessment & Plan Note (Signed)
Retrieve reports from [place], 2005. Obtain lipase. Due to diabetes and issues with management (has pump, pt has been self-managing dosing), will refer to endocrinology at the pt and husband's request.

## 2011-05-19 NOTE — Assessment & Plan Note (Signed)
Hx of Hep C per pt. Untreated, states was told not necessary. Will draw updated HFP as already mentioned as well as HCV RNA quant. May be proceeding with [place] of abd if necessary.

## 2011-05-20 LAB — HEPATITIS C RNA QUANTITATIVE

## 2011-05-20 NOTE — Progress Notes (Signed)
Cc to PCP 

## 2011-05-20 NOTE — Progress Notes (Signed)
Quick Note:  Normal lipase. No anemia. Liver ok. Protein just a smidge lower than normal. Negative celiac. Still awaiting HCV RNA quantitative. ______

## 2011-05-21 NOTE — Progress Notes (Signed)
Quick Note:  Increase Protonix to BID for now. Have her follow a low-fat diet. Eat small meals. If she has any problems with shortness of breath, she needs to seek medical attention. ______

## 2011-05-24 ENCOUNTER — Telehealth: Payer: Self-pay | Admitting: Internal Medicine

## 2011-05-24 NOTE — Telephone Encounter (Signed)
Pt is calling to get her results. Please return her call at [phone]

## 2011-05-24 NOTE — Telephone Encounter (Signed)
Ginger gave pt results on 05/21/11. Pt wants to know what the hep c results are.   She is also asking about the referral to the endocrinologist.

## 2011-05-25 NOTE — Telephone Encounter (Signed)
Referral faxed to Dr Fransico Him

## 2011-05-26 NOTE — Progress Notes (Signed)
Quick Note:  No detectable level of HCV RNA. Make sure taking Protonix BID as instructed. Continue with plans for EGD and TCS. ______

## 2011-05-26 NOTE — Telephone Encounter (Signed)
See result notes regarding HCV RNA.

## 2011-05-30 ENCOUNTER — Inpatient Hospital Stay (HOSPITAL_COMMUNITY)
Admission: EM | Admit: 2011-05-30 | Discharge: 2011-06-07 | DRG: 286 | Disposition: A | Discharge status: Home / Self Care | Payer: Medicare Other | Attending: Internal Medicine | Admitting: Internal Medicine

## 2011-05-30 ENCOUNTER — Other Ambulatory Visit: Payer: Self-pay

## 2011-05-30 ENCOUNTER — Encounter (HOSPITAL_COMMUNITY): Payer: Self-pay | Admitting: *Deleted

## 2011-05-30 ENCOUNTER — Emergency Department (HOSPITAL_COMMUNITY): Payer: Medicare Other

## 2011-05-30 DIAGNOSIS — J449 Chronic obstructive pulmonary disease, unspecified: Secondary | ICD-10-CM | POA: Diagnosis present

## 2011-05-30 DIAGNOSIS — Z7982 Long term (current) use of aspirin: Secondary | ICD-10-CM

## 2011-05-30 DIAGNOSIS — I350 Nonrheumatic aortic (valve) stenosis: Secondary | ICD-10-CM

## 2011-05-30 DIAGNOSIS — F411 Generalized anxiety disorder: Secondary | ICD-10-CM | POA: Diagnosis present

## 2011-05-30 DIAGNOSIS — I359 Nonrheumatic aortic valve disorder, unspecified: Secondary | ICD-10-CM | POA: Diagnosis present

## 2011-05-30 DIAGNOSIS — J4489 Other specified chronic obstructive pulmonary disease: Secondary | ICD-10-CM | POA: Diagnosis present

## 2011-05-30 DIAGNOSIS — Z6833 Body mass index (BMI) 33.0-33.9, adult: Secondary | ICD-10-CM

## 2011-05-30 DIAGNOSIS — Z886 Allergy status to analgesic agent status: Secondary | ICD-10-CM

## 2011-05-30 DIAGNOSIS — J9600 Acute respiratory failure, unspecified whether with hypoxia or hypercapnia: Secondary | ICD-10-CM | POA: Diagnosis not present

## 2011-05-30 DIAGNOSIS — I428 Other cardiomyopathies: Secondary | ICD-10-CM | POA: Diagnosis present

## 2011-05-30 DIAGNOSIS — I10 Essential (primary) hypertension: Secondary | ICD-10-CM | POA: Diagnosis present

## 2011-05-30 DIAGNOSIS — I509 Heart failure, unspecified: Secondary | ICD-10-CM | POA: Diagnosis present

## 2011-05-30 DIAGNOSIS — E119 Type 2 diabetes mellitus without complications: Secondary | ICD-10-CM | POA: Diagnosis present

## 2011-05-30 DIAGNOSIS — Z9641 Presence of insulin pump (external) (internal): Secondary | ICD-10-CM

## 2011-05-30 DIAGNOSIS — Z87891 Personal history of nicotine dependence: Secondary | ICD-10-CM

## 2011-05-30 DIAGNOSIS — IMO0001 Reserved for inherently not codable concepts without codable children: Secondary | ICD-10-CM | POA: Diagnosis present

## 2011-05-30 DIAGNOSIS — E785 Hyperlipidemia, unspecified: Secondary | ICD-10-CM | POA: Diagnosis present

## 2011-05-30 DIAGNOSIS — Z888 Allergy status to other drugs, medicaments and biological substances status: Secondary | ICD-10-CM

## 2011-05-30 DIAGNOSIS — Z23 Encounter for immunization: Secondary | ICD-10-CM

## 2011-05-30 DIAGNOSIS — Z8619 Personal history of other infectious and parasitic diseases: Secondary | ICD-10-CM

## 2011-05-30 DIAGNOSIS — I5021 Acute systolic (congestive) heart failure: Secondary | ICD-10-CM

## 2011-05-30 DIAGNOSIS — I5022 Chronic systolic (congestive) heart failure: Secondary | ICD-10-CM | POA: Diagnosis present

## 2011-05-30 DIAGNOSIS — I4510 Unspecified right bundle-branch block: Secondary | ICD-10-CM | POA: Diagnosis present

## 2011-05-30 DIAGNOSIS — R1013 Epigastric pain: Secondary | ICD-10-CM

## 2011-05-30 HISTORY — DX: Nonrheumatic aortic (valve) stenosis: I35.0

## 2011-05-30 HISTORY — DX: Unspecified right bundle-branch block: I45.10

## 2011-05-30 LAB — CARDIAC PANEL(CRET KIN+CKTOT+MB+TROPI)
CK, MB: 3.5 ng/mL (ref 0.3–4.0)
Total CK: 55 U/L (ref 7–177)
Troponin I: <0.3 ng/mL (ref <0.30)

## 2011-05-30 LAB — COMPREHENSIVE METABOLIC PANEL
BUN: 24 mg/dL — ABNORMAL HIGH (ref 6–23)
CO2: 32 mEq/L (ref 19–32)
Calcium: 8.6 mg/dL (ref 8.4–10.5)
Chloride: 97 mEq/L (ref 96–112)
Creatinine, Ser: 0.87 mg/dL (ref 0.50–1.10)
GFR calc Af Amer: 78 mL/min — ABNORMAL LOW (ref >90)
GFR calc non Af Amer: 67 mL/min — ABNORMAL LOW (ref >90)
Glucose, Bld: 304 mg/dL — ABNORMAL HIGH (ref 70–99)
Total Bilirubin: 0.3 mg/dL (ref 0.3–1.2)

## 2011-05-30 LAB — DIFFERENTIAL
Basophils Absolute: 0 10*3/uL (ref 0.0–0.1)
Eosinophils Relative: 3 % (ref 0–5)
Lymphocytes Relative: 17 % (ref 12–46)
Lymphs Abs: 1.1 10*3/uL (ref 0.7–4.0)
Monocytes Absolute: 0.4 10*3/uL (ref 0.1–1.0)
Monocytes Relative: 5 % (ref 3–12)

## 2011-05-30 LAB — CBC
HCT: 36 % (ref 36.0–46.0)
Hemoglobin: 10.7 g/dL — ABNORMAL LOW (ref 12.0–15.0)
MCV: 80.7 fL (ref 78.0–100.0)
RDW: 17.7 % — ABNORMAL HIGH (ref 11.5–15.5)
WBC: 6.7 10*3/uL (ref 4.0–10.5)

## 2011-05-30 MED ORDER — METOPROLOL TARTRATE 25 MG PO TABS
25.0000 mg | ORAL_TABLET | Freq: Two times a day (BID) | ORAL | Status: DC
  Administered 2011-05-30 – 2011-06-01 (×5): 25 mg via ORAL
  Filled 2011-05-30 (×6): qty 1

## 2011-05-30 MED ORDER — ONDANSETRON HCL 4 MG/2ML IJ SOLN: 4.0000 mg | Freq: Four times a day (QID) | INTRAMUSCULAR | Status: DC | PRN

## 2011-05-30 MED ORDER — ACETAMINOPHEN 325 MG PO TABS: 650.0000 mg | ORAL_TABLET | Freq: Four times a day (QID) | ORAL | Status: DC | PRN

## 2011-05-30 MED ORDER — OMEGA-3 FATTY ACIDS 1000 MG PO CAPS
2.0000 g | ORAL_CAPSULE | Freq: Every day | ORAL | Status: DC
  Administered 2011-05-31 – 2011-06-01 (×2): 2 g via ORAL
  Filled 2011-05-30 (×4): qty 2

## 2011-05-30 MED ORDER — ALBUTEROL SULFATE (5 MG/ML) 0.5% IN NEBU
5.0000 mg | INHALATION_SOLUTION | Freq: Once | RESPIRATORY_TRACT | Status: AC
  Administered 2011-05-30: 5 mg via RESPIRATORY_TRACT
  Filled 2011-05-30: qty 1

## 2011-05-30 MED ORDER — DILTIAZEM HCL ER 240 MG PO CP24
240.0000 mg | ORAL_CAPSULE | Freq: Every day | ORAL | Status: DC
  Filled 2011-05-30 (×2): qty 1

## 2011-05-30 MED ORDER — FLUTICASONE-SALMETEROL 250-50 MCG/DOSE IN AEPB
INHALATION_SPRAY | RESPIRATORY_TRACT | Status: AC
  Filled 2011-05-30: qty 14

## 2011-05-30 MED ORDER — PNEUMOCOCCAL VAC POLYVALENT 25 MCG/0.5ML IJ INJ
0.5000 mL | INJECTION | INTRAMUSCULAR | Status: AC
  Filled 2011-05-30: qty 0.5

## 2011-05-30 MED ORDER — LOSARTAN POTASSIUM 50 MG PO TABS
100.0000 mg | ORAL_TABLET | Freq: Every day | ORAL | Status: DC
  Administered 2011-05-31 – 2011-06-07 (×8): 100 mg via ORAL
  Filled 2011-05-30 (×10): qty 2

## 2011-05-30 MED ORDER — ALBUTEROL SULFATE (5 MG/ML) 0.5% IN NEBU
2.5000 mg | INHALATION_SOLUTION | RESPIRATORY_TRACT | Status: DC | PRN
  Administered 2011-05-31 – 2011-06-02 (×4): 2.5 mg via RESPIRATORY_TRACT
  Filled 2011-05-30 (×5): qty 0.5

## 2011-05-30 MED ORDER — ACETAMINOPHEN 650 MG RE SUPP: 650.0000 mg | Freq: Four times a day (QID) | RECTAL | Status: DC | PRN

## 2011-05-30 MED ORDER — FUROSEMIDE 10 MG/ML IJ SOLN
40.0000 mg | Freq: Once | INTRAMUSCULAR | Status: AC
  Administered 2011-05-30: 40 mg via INTRAVENOUS
  Filled 2011-05-30: qty 4

## 2011-05-30 MED ORDER — IPRATROPIUM BROMIDE 0.02 % IN SOLN
0.5000 mg | Freq: Once | RESPIRATORY_TRACT | Status: AC
  Administered 2011-05-30: 0.5 mg via RESPIRATORY_TRACT
  Filled 2011-05-30: qty 2.5

## 2011-05-30 MED ORDER — HEPARIN SODIUM (PORCINE) 5000 UNIT/ML IJ SOLN
5000.0000 [IU] | Freq: Three times a day (TID) | INTRAMUSCULAR | Status: DC
  Administered 2011-05-30 – 2011-06-07 (×21): 5000 [IU] via SUBCUTANEOUS
  Filled 2011-05-30 (×25): qty 1

## 2011-05-30 MED ORDER — METOPROLOL TARTRATE 50 MG PO TABS: 50.0000 mg | ORAL_TABLET | Freq: Two times a day (BID) | ORAL | Status: DC

## 2011-05-30 MED ORDER — PANTOPRAZOLE SODIUM 40 MG PO TBEC
40.0000 mg | DELAYED_RELEASE_TABLET | Freq: Every day | ORAL | Status: DC
  Administered 2011-05-31 – 2011-06-01 (×2): 40 mg via ORAL
  Filled 2011-05-30 (×2): qty 1

## 2011-05-30 MED ORDER — DOCUSATE SODIUM 100 MG PO CAPS
100.0000 mg | ORAL_CAPSULE | Freq: Two times a day (BID) | ORAL | Status: DC
  Administered 2011-05-30 – 2011-06-07 (×16): 100 mg via ORAL
  Filled 2011-05-30 (×18): qty 1

## 2011-05-30 MED ORDER — ALUM & MAG HYDROXIDE-SIMETH 200-200-20 MG/5ML PO SUSP: 30.0000 mL | Freq: Four times a day (QID) | ORAL | Status: DC | PRN

## 2011-05-30 MED ORDER — FUROSEMIDE 10 MG/ML IJ SOLN
80.0000 mg | Freq: Two times a day (BID) | INTRAMUSCULAR | Status: DC
  Administered 2011-05-31 – 2011-06-02 (×5): 80 mg via INTRAVENOUS
  Filled 2011-05-30 (×3): qty 8
  Filled 2011-05-30: qty 4
  Filled 2011-05-30 (×2): qty 8

## 2011-05-30 MED ORDER — TRAZODONE 25 MG HALF TABLET
25.0000 mg | ORAL_TABLET | Freq: Every evening | ORAL | Status: DC | PRN
  Administered 2011-06-06: 25 mg via ORAL
  Filled 2011-05-30 (×2): qty 1

## 2011-05-30 MED ORDER — METHYLPREDNISOLONE SODIUM SUCC 125 MG IJ SOLR
125.0000 mg | Freq: Once | INTRAMUSCULAR | Status: AC
  Administered 2011-05-30: 125 mg via INTRAVENOUS
  Filled 2011-05-30: qty 2

## 2011-05-30 MED ORDER — HYDROCODONE-ACETAMINOPHEN 5-325 MG PO TABS: 1.0000 | ORAL_TABLET | ORAL | Status: DC | PRN

## 2011-05-30 MED ORDER — SIMVASTATIN 20 MG PO TABS
20.0000 mg | ORAL_TABLET | Freq: Every day | ORAL | Status: DC
  Administered 2011-05-30: 20 mg via ORAL
  Filled 2011-05-30: qty 1

## 2011-05-30 MED ORDER — INSULIN PUMP
Freq: Three times a day (TID) | SUBCUTANEOUS | Status: DC
  Administered 2011-05-30: 1 via SUBCUTANEOUS
  Administered 2011-05-31: 5.4 via SUBCUTANEOUS
  Administered 2011-05-31: 1 via SUBCUTANEOUS
  Administered 2011-05-31: 6 via SUBCUTANEOUS
  Administered 2011-05-31: 5 via SUBCUTANEOUS
  Administered 2011-05-31: 1 via SUBCUTANEOUS
  Administered 2011-06-01 (×3): via SUBCUTANEOUS
  Administered 2011-06-01: 1 via SUBCUTANEOUS
  Administered 2011-06-03: 5 via SUBCUTANEOUS
  Administered 2011-06-03: 11 via SUBCUTANEOUS
  Administered 2011-06-04: 4 via SUBCUTANEOUS
  Administered 2011-06-04 (×2): 5 via SUBCUTANEOUS
  Administered 2011-06-05: 18:00:00 via SUBCUTANEOUS
  Administered 2011-06-05 – 2011-06-06 (×2): 4.5 via SUBCUTANEOUS
  Filled 2011-05-30: qty 1

## 2011-05-30 MED ORDER — ONDANSETRON HCL 4 MG PO TABS: 4.0000 mg | ORAL_TABLET | Freq: Four times a day (QID) | ORAL | Status: DC | PRN

## 2011-05-30 MED ORDER — FLUTICASONE-SALMETEROL 250-50 MCG/DOSE IN AEPB
1.0000 | INHALATION_SPRAY | Freq: Two times a day (BID) | RESPIRATORY_TRACT | Status: DC
  Administered 2011-05-31 – 2011-06-06 (×13): 1 via RESPIRATORY_TRACT
  Filled 2011-05-30 (×2): qty 14

## 2011-05-30 MED ORDER — TIOTROPIUM BROMIDE MONOHYDRATE 18 MCG IN CAPS
18.0000 ug | ORAL_CAPSULE | Freq: Every day | RESPIRATORY_TRACT | Status: DC
  Administered 2011-05-31 – 2011-06-06 (×6): 18 ug via RESPIRATORY_TRACT
  Filled 2011-05-30 (×2): qty 5

## 2011-05-30 MED ORDER — SENNA 8.6 MG PO TABS
2.0000 | ORAL_TABLET | Freq: Every day | ORAL | Status: DC | PRN
  Administered 2011-06-02 – 2011-06-04 (×3): 17.2 mg via ORAL
  Filled 2011-05-30: qty 1
  Filled 2011-05-30: qty 2
  Filled 2011-05-30: qty 1
  Filled 2011-05-30: qty 2

## 2011-05-30 MED ORDER — ALPRAZOLAM 0.5 MG PO TABS
0.5000 mg | ORAL_TABLET | Freq: Four times a day (QID) | ORAL | Status: DC | PRN
Start: 2011-05-30 — End: 2011-06-07

## 2011-05-30 MED ORDER — ALBUTEROL SULFATE (5 MG/ML) 0.5% IN NEBU
2.5000 mg | INHALATION_SOLUTION | RESPIRATORY_TRACT | Status: AC
  Administered 2011-05-30 – 2011-05-31 (×3): 2.5 mg via RESPIRATORY_TRACT
  Filled 2011-05-30 (×3): qty 0.5

## 2011-05-30 MED ORDER — ONDANSETRON HCL 4 MG/2ML IJ SOLN: 4.0000 mg | Freq: Three times a day (TID) | INTRAMUSCULAR | Status: AC | PRN

## 2011-05-30 NOTE — ED Notes (Signed)
Pt alert and oriented x 3. Skin warm and dry. Color pink. Respirations labored and get worse with movement. Pt has pitting edema to bilateral lower extremities. Pt placed on CCM showing SR.

## 2011-05-30 NOTE — ED Provider Notes (Signed)
History     CSN: [id number]  Arrival date & time 05/30/11  1245   First MD Initiated Contact with Patient 05/30/11 1326      Chief Complaint  Patient presents with  . Shortness of Breath    (Consider location/radiation/quality/duration/timing/severity/associated sxs/prior treatment) HPI Comments: Patient states she's had increasing shortness of breath and epigastric abdominal the past 2 months. Has been seen by 2 physicians was recently put on Lasix for increasing edema. States she has been adjusting her medications in order to decrease the edema however has been unsuccessful.  She denies cp  Patient is a 70 y.o. female presenting with shortness of breath. The history is provided by the patient. No language interpreter was used.  Shortness of Breath  The current episode started more than 2 weeks ago (increasing over past 2 months). The onset was gradual. The problem occurs continuously. The problem has been gradually worsening. The problem is moderate. The symptoms are relieved by nothing. The symptoms are aggravated by a supine position. Associated symptoms include orthopnea, shortness of breath and wheezing. Pertinent negatives include no chest pain, no fever, no rhinorrhea, no sore throat and no cough. She has had intermittent steroid use. She has had prior hospitalizations.    Past Medical History  Diagnosis Date  . DM (diabetes mellitus)   . HTN (hypertension)   . Dyslipidemia   . Hepatitis C     per pt, hx of  . Depression   . Necrotizing pancreatitis   . CAD (coronary artery disease)   . Neuropathy   . Respiratory failure, acute   . Rhabdomyolysis   . Gallstone pancreatitis     2005    Past Surgical History  Procedure Date  . Esophagogastroduodenoscopy 04/21/04    normal esophagus/normal D1,D2; attempted  transgastric drainage/stent placement of pancreatic pseudocyst, no obvious location for gastrotomy, therefore transferred to [facility]  . Debridement pancreas   .  Pancreatic pseudocyst drainage   . Cholecystectomy     Family History  Problem Relation Age of Onset  . Colon cancer Neg Hx     History  Substance Use Topics  . Smoking status: Former Smoker -- 1.0 packs/day    Types: Cigarettes  . Smokeless tobacco: Not on file   Comment: quit about 20+ yrs ago  . Alcohol Use: No    OB History    Grav Para Term Preterm Abortions TAB SAB Ect Mult Living                  Review of Systems  Constitutional: Positive for activity change and fatigue. Negative for fever, chills and appetite change.  HENT: Negative for congestion, sore throat, rhinorrhea, neck pain and neck stiffness.   Respiratory: Positive for shortness of breath and wheezing. Negative for cough.   Cardiovascular: Positive for orthopnea. Negative for chest pain and palpitations.  Gastrointestinal: Positive for abdominal pain. Negative for nausea and vomiting.  Genitourinary: Negative for dysuria, urgency, frequency and flank pain.  Neurological: Negative for dizziness, weakness, light-headedness and headaches.  All other systems reviewed and are negative.    Allergies  Codeine and Phenergan  Home Medications   Current Outpatient Rx  Name Route Sig Dispense Refill  . ALPRAZOLAM 1 MG PO TABS Oral Take 1 mg by mouth 4 (four) times daily as needed. anxiety     . DILTIAZEM HCL ER 240 MG PO CP24 Oral Take 240 mg by mouth daily.      Marland Kitchen DOCUSATE SODIUM 100 MG PO CAPS  Oral Take 100 mg by mouth 2 (two) times daily.      . OMEGA-3 FATTY ACIDS 1000 MG PO CAPS Oral Take 2 g by mouth daily.      Marland Kitchen FLUTICASONE-SALMETEROL 250-50 MCG/DOSE IN AEPB Inhalation Inhale 1 puff into the lungs every 12 (twelve) hours.      . FUROSEMIDE 20 MG PO TABS Oral Take 20 mg by mouth 2 (two) times daily.      Marland Kitchen LACTINEX PO CHEW Oral Chew 1 tablet by mouth 3 (three) times daily with meals.      Marland Kitchen LORAZEPAM 1 MG PO TABS Oral Take 1 mg by mouth every 8 (eight) hours as needed. anxiety    . LOSARTAN POTASSIUM  100 MG PO TABS Oral Take 100 mg by mouth daily as needed. When blood gets to 140/80    . METFORMIN HCL ER (MOD) 500 MG PO TB24 Oral Take 500 mg by mouth every other day.     Marland Kitchen METOPROLOL TARTRATE 50 MG PO TABS Oral Take 50 mg by mouth 2 (two) times daily. 1/2 tablet bid     . PANTOPRAZOLE SODIUM 40 MG PO TBEC Oral Take 1 tablet (40 mg total) by mouth daily. Take 30 minutes before breakfast 30 tablet 1  . SIMVASTATIN 20 MG PO TABS Oral Take 20 mg by mouth at bedtime.      Marland Kitchen TIOTROPIUM BROMIDE MONOHYDRATE 18 MCG IN CAPS Inhalation Place 18 mcg into inhaler and inhale daily.      . INSULIN REGULAR HUMAN 100 UNIT/ML IJ SOLN Subcutaneous Inject into the skin. Insulin pump 2.5 units every hours     . ROFLUMILAST 500 MCG PO TABS Oral Take 500 mcg by mouth daily.        BP 146/73  Pulse 95  Temp(Src) 97.6 F (36.4 C) (Oral)  Resp 26  Ht 5\' 4"  (1.626 m)  Wt 225 lb (102.059 kg)  BMI 38.62 kg/m2  SpO2 97%  Physical Exam  Nursing note and vitals reviewed. Constitutional: She is oriented to person, place, and time. She appears well-developed and well-nourished.       Uncomfortable appearing  HENT:  Head: Normocephalic and atraumatic.  Mouth/Throat: Oropharynx is clear and moist.  Eyes: Conjunctivae and EOM are normal. Pupils are equal, round, and reactive to light.  Neck: Normal range of motion. Neck supple.  Cardiovascular: Normal rate, regular rhythm, normal heart sounds and intact distal pulses.  Exam reveals no gallop and no friction rub.   No murmur heard. Pulmonary/Chest: No respiratory distress. She has wheezes (faint and diminished). She has rales (bases bilaterally). She exhibits no tenderness.       Mild tachypnea  Abdominal: Soft. Bowel sounds are normal. There is no tenderness.  Musculoskeletal: Normal range of motion. She exhibits edema (3+ pitting). She exhibits no tenderness.  Neurological: She is alert and oriented to person, place, and time.  Skin: Skin is warm and dry. No  rash noted.    ED Course  Procedures (including critical care time)  CRITICAL CARE Performed by: Dayton Bailiff   Total critical care time: 30 min  Critical care time was exclusive of separately billable procedures and treating other patients.  Critical care was necessary to treat or prevent imminent or life-threatening deterioration.  Critical care was time spent personally by me on the following activities: development of treatment plan with patient and/or surrogate as well as nursing, discussions with consultants, evaluation of patient's response to treatment, examination of patient, obtaining history from  patient or surrogate, ordering and performing treatments and interventions, ordering and review of laboratory studies, ordering and review of radiographic studies, pulse oximetry and re-evaluation of patient's condition.   Date: 05/30/2011  Rate: 89  Rhythm: normal sinus rhythm  QRS Axis: left  Intervals: prolonged QT  ST/T Wave abnormalities: lateral twave inversions  Conduction Disutrbances:right bundle branch block  Narrative Interpretation:   Old EKG Reviewed: changes noted  Labs Reviewed  CBC - Abnormal; Notable for the following:    Hemoglobin 10.7 (*)    MCH 24.0 (*)    MCHC 29.7 (*)    RDW 17.7 (*)    All other components within normal limits  COMPREHENSIVE METABOLIC PANEL - Abnormal; Notable for the following:    Sodium 132 (*)    Glucose, Bld 304 (*)    BUN 24 (*)    Albumin 2.8 (*)    GFR calc non Af Amer 67 (*)    GFR calc Af Amer 78 (*)    All other components within normal limits  PRO B NATRIURETIC PEPTIDE - Abnormal; Notable for the following:    Pro B Natriuretic peptide (BNP) 8423.0 (*)    All other components within normal limits  DIFFERENTIAL  CARDIAC PANEL(CRET KIN+CKTOT+MB+TROPI)  LIPASE, BLOOD   Dg Chest Portable 1 View  05/30/2011  *RADIOLOGY REPORT*  Clinical Data: Short of breath  PORTABLE CHEST - 1 VIEW  Comparison: Chest radiograph  10/03/2009  Findings: Stable enlarged heart silhouette.  There is mild central venous pulmonary congestion.  Left base is poorly evaluated.  No pneumothorax.  IMPRESSION: Cardiomegaly and  central venous pulmonary congestion.  Left base is poorly evaluated.  Original Report Authenticated By: Genevive Bi, M.D.     1. CHF (congestive heart failure)   2. COPD (chronic obstructive pulmonary disease)       MDM  CHF exacerbation with superimposed COPD. I administered breathing treatments, steroids, Lasix for elevated BNP, lower extremity edema, vascular congestion. She'll normal blood pressure did not require BiPAP or nitroglycerin. This is a new diagnosis of CHF. Requires admission for further evaluation and treatment.  Discussed with [facility] hospitalist who accepted patient for admission to telemetry bed        Dayton Bailiff, MD 05/30/11 1545

## 2011-05-30 NOTE — ED Notes (Signed)
Pt states that she has been increasingly short of breath and having gas and burping for 2 months. Pt has been seeing Dr. Regino Schultze and was seen by Dr. Benard Rink last week. Pt has been on Lasix for the swelling in her feet and legs. Pt has been adjusting her own meds and has stopped taking some of them. Pt is also on an Insulin pump.

## 2011-05-30 NOTE — H&P (Signed)
Hospital Admission Note Date: 05/30/2011  Patient name: Kathryn Morgan Medical record number: [id number] Date of birth: 01/01/1941 Age: 70 y.o. Gender: female PCP: [name], MD  Attending physician: Christiane Ha  Chief Complaint: Shortness of breath  History of Present Illness:  Kathryn Morgan is an 70 y.o. female   who presents with worsening shortness of breath over the past 2 months. She's also had severe bilateral leg swelling, resistant to Lasix. She was started on 20 mg of Lasix by her primary care physician. It had no effect on her edema. She doubled the dose on her own without any significant results. She also has had orthopnea. She sleeps in a recliner now. She has had paroxysmal nocturnal dyspnea. He also has had gas pain. She has had no cough or chest pain. She's had no palpitations. She's never had CHF. She has no history of heart disease that she knows of. She has a history of COPD. In the emergency room, she had a chest x-ray which showed vascular congestion and cardiomegaly. Her pro BNP was over 8000. She is being admitted for treatment and workup of her new CHF. She reports having had a negative stress test a few years ago.  Past Medical History  Diagnosis Date  . DM (diabetes mellitus)   . HTN (hypertension)   . Dyslipidemia   . Hepatitis C     per pt, hx of  . Depression   . Necrotizing pancreatitis       . Neuropathy   . Respiratory failure, acute   . Rhabdomyolysis   . Gallstone pancreatitis     2005    Meds: Prescriptions prior to admission  Medication Sig Dispense Refill  . ALPRAZolam (XANAX) 1 MG tablet Take 1 mg by mouth 4 (four) times daily as needed. anxiety       . diltiazem (DILACOR XR) 240 MG 24 hr capsule Take 240 mg by mouth daily.        Marland Kitchen docusate sodium (COLACE) 100 MG capsule Take 100 mg by mouth 2 (two) times daily.        . fish oil-omega-3 fatty acids 1000 MG capsule Take 2 g by mouth daily.        . Fluticasone-Salmeterol  (ADVAIR DISKUS) 250-50 MCG/DOSE AEPB Inhale 1 puff into the lungs every 12 (twelve) hours.        . furosemide (LASIX) 20 MG tablet Take 20 mg by mouth 2 (two) times daily.        Marland Kitchen lactobacillus acidophilus & bulgar (LACTINEX) chewable tablet Chew 1 tablet by mouth 3 (three) times daily with meals.        Marland Kitchen LORazepam (ATIVAN) 1 MG tablet Take 1 mg by mouth every 8 (eight) hours as needed. anxiety      . losartan (COZAAR) 100 MG tablet Take 100 mg by mouth daily as needed. When blood gets to 140/80      . metoprolol (LOPRESSOR) 50 MG tablet Take 50 mg by mouth 2 (two) times daily. 1/2 tablet bid       . pantoprazole (PROTONIX) 40 MG tablet Take 1 tablet (40 mg total) by mouth daily. Take 30 minutes before breakfast  30 tablet  1  . simvastatin (ZOCOR) 20 MG tablet Take 20 mg by mouth at bedtime.        Marland Kitchen tiotropium (SPIRIVA) 18 MCG inhalation capsule Place 18 mcg into inhaler and inhale daily.        Marland Kitchen DISCONTD: metFORMIN (GLUMETZA)  500 MG (MOD) 24 hr tablet Take 500 mg by mouth every other day.       . insulin regular (NOVOLIN R,HUMULIN R) 100 units/mL injection Inject into the skin. Insulin pump 2.5 units every hours       . DISCONTD: roflumilast (DALIRESP) 500 MCG TABS tablet Take 500 mcg by mouth daily.          Allergies: Codeine and Phenergan History   Social History  . Marital Status: Married    Spouse Name: N/A    Number of Children: N/A  . Years of Education: N/A   Occupational History  . Not on file.   Social History Main Topics  . Smoking status: Former Smoker -- 1.0 packs/day    Types: Cigarettes  . Smokeless tobacco: Not on file   Comment: quit about 20+ yrs ago  . Alcohol Use: No  . Drug Use: No  . Sexually Active: Not on file   Other Topics Concern  . Not on file   Social History Narrative  . No narrative on file   Family History  Problem Relation Age of Onset  . Colon cancer Neg Hx    Past Surgical History  Procedure Date  . Esophagogastroduodenoscopy  04/21/04    normal esophagus/normal D1,D2; attempted  transgastric drainage/stent placement of pancreatic pseudocyst, no obvious location for gastrotomy, therefore transferred to [facility]  . Debridement pancreas   . Pancreatic pseudocyst drainage   . Cholecystectomy     Review of Systems: Systems reviewed and as per HPI, otherwise negative.  Physical Exam: Blood pressure 165/89, pulse 108, temperature 98.4 F (36.9 C), temperature source Oral, resp. rate 26, height 5\' 4"  (1.626 m), weight 100.8 kg (222 lb 3.6 oz), SpO2 98.00%. BP 165/89  Pulse 108  Temp(Src) 98.4 F (36.9 C) (Oral)  Resp 26  Ht 5\' 4"  (1.626 m)  Wt 100.8 kg (222 lb 3.6 oz)  BMI 38.14 kg/m2  SpO2 98%  General Appearance:    Alert, cooperative, no distress, appears stated age. Breathing is nonlabored. She is morbidly obese.   Head:    Normocephalic, without obvious abnormality, atraumatic  Eyes:    PERRL, conjunctiva/corneas clear, EOM's intact, fundi    benign, both eyes     Nose:   Nares normal, septum midline, mucosa normal, no drainage    or sinus tenderness  Throat:   Lips, mucosa, and tongue normal; teeth and gums normal  Neck:   Supple, symmetrical, trachea midline, no adenopathy;    thyroid:  no enlargement/tenderness/nodules; no carotid   bruit. Neck is thick and difficult to assess for JVD.   Back:     Symmetric, no curvature, ROM normal, no CVA tenderness  Lungs:     mild rales at the left base. Good air movement. No accessory muscle use. No rhonchi or wheeze.   Chest Wall:    No tenderness or deformity   Heart:    Regular rate and rhythm, S1 and S2 normal, no murmur, rub   or gallop     Abdomen:     Soft, non-tender, bowel sounds active all four quadrants,    no masses, no organomegaly. Obese   Genitalia:   deferred   Rectal:   deferred   Extremities:   Extremities normal, atraumatic, no cyanosis.3+ pitting edema   Pulses:   2+ and symmetric all extremities  Skin:   Skin color, texture, turgor  normal, no rashes or lesions  Lymph nodes:   Cervical, supraclavicular, and axillary nodes  normal  Neurologic:   CNII-XII intact, normal strength, sensation and reflexes    throughout    Psychiatric: Normal affect.  Lab results: Basic Metabolic Panel:  Basename 05/30/11 1335  NA 132*  K 4.4  CL 97  CO2 32  GLUCOSE 304*  BUN 24*  CREATININE 0.87  CALCIUM 8.6  MG --  PHOS --   Liver Function Tests:  [facility] 05/30/11 1335  AST 14  ALT 19  ALKPHOS 98  BILITOT 0.3  PROT 6.3  ALBUMIN 2.8*    Basename 05/30/11 1335  LIPASE 14  AMYLASE --   No results found for this basename: AMMONIA:2 in the last 72 hours CBC:  Basename 05/30/11 1335  WBC 6.7  NEUTROABS 5.0  HGB 10.7*  HCT 36.0  MCV 80.7  PLT 258   Cardiac Enzymes:  Basename 05/30/11 1335  CKTOTAL 55  CKMB 3.5  CKMBINDEX --  TROPONINI <0.30   BNP:  Basename 05/30/11 1335  PROBNP 8423.0*   Imaging results:  Dg Chest Portable 1 View  05/30/2011  *RADIOLOGY REPORT*  Clinical Data: Short of breath  PORTABLE CHEST - 1 VIEW  Comparison: Chest radiograph 10/03/2009  Findings: Stable enlarged heart silhouette.  There is mild central venous pulmonary congestion.  Left base is poorly evaluated.  No pneumothorax.  IMPRESSION: Cardiomegaly and  central venous pulmonary congestion.  Left base is poorly evaluated.  Original Report Authenticated By: Genevive Bi, M.D.   EKG shows normal sinus rhythm with right bundle branch block, old, left axis deviation. Assessment & Plan: Principal Problem:  *Acute CHF Active Problems:  DM (diabetes mellitus), type 2, uncontrolled  Benign essential HTN  COPD (chronic obstructive pulmonary disease)  Hepatitis C  Morbid obesity  Anxiety  RBBB (right bundle branch block with left anterior fascicular block)  Patient will get IV Lasix twice daily. Check echocardiogram, TSH. She will be monitored on telemetry. Daily weights. Low salt fluid restricted diet. Her right bundle  branch block is old. Her COPD appears stable. Continue home medications. Continue insulin pump. She has an appointment with Dr. Fransico Him next month. She stop metformin on her own. Her albumin is low which may be contributing. Her last hepatitis C quantitative assay was negligible. Her liver function tests are unremarkable. Check a urinalysis to assess protein.  Kathryn Morgan L 05/30/2011, 6:28 PM

## 2011-05-31 DIAGNOSIS — I369 Nonrheumatic tricuspid valve disorder, unspecified: Secondary | ICD-10-CM

## 2011-05-31 LAB — GLUCOSE, CAPILLARY
Glucose-Capillary: 387 mg/dL — ABNORMAL HIGH (ref 70–99)
Glucose-Capillary: 456 mg/dL — ABNORMAL HIGH (ref 70–99)

## 2011-05-31 LAB — URINALYSIS, ROUTINE W REFLEX MICROSCOPIC
Bilirubin Urine: NEGATIVE
Hgb urine dipstick: NEGATIVE
Nitrite: NEGATIVE
Protein, ur: NEGATIVE mg/dL
Urobilinogen, UA: 0.2 mg/dL (ref 0.0–1.0)

## 2011-05-31 LAB — TSH: TSH: 0.083 u[IU]/mL — ABNORMAL LOW (ref 0.350–4.500)

## 2011-05-31 LAB — URINE MICROSCOPIC-ADD ON

## 2011-05-31 LAB — HEMOGLOBIN A1C: Hgb A1c MFr Bld: 8.2 % — ABNORMAL HIGH (ref <5.7)

## 2011-05-31 MED ORDER — ASPIRIN 81 MG PO CHEW
81.0000 mg | CHEWABLE_TABLET | Freq: Every day | ORAL | Status: DC
  Administered 2011-05-31 – 2011-06-07 (×7): 81 mg via ORAL
  Filled 2011-05-31 (×7): qty 1

## 2011-05-31 MED ORDER — LACTINEX PO CHEW
1.0000 | CHEWABLE_TABLET | Freq: Three times a day (TID) | ORAL | Status: DC
  Administered 2011-05-31 – 2011-06-06 (×18): 1 via ORAL
  Filled 2011-05-31 (×28): qty 1

## 2011-05-31 MED ORDER — DILTIAZEM HCL ER COATED BEADS 240 MG PO CP24
240.0000 mg | ORAL_CAPSULE | Freq: Every day | ORAL | Status: DC
  Administered 2011-05-31 – 2011-06-01 (×2): 240 mg via ORAL
  Filled 2011-05-31 (×3): qty 1

## 2011-05-31 MED ORDER — ROSUVASTATIN CALCIUM 5 MG PO TABS
5.0000 mg | ORAL_TABLET | Freq: Every day | ORAL | Status: DC
  Administered 2011-05-31 – 2011-06-07 (×8): 5 mg via ORAL
  Filled 2011-05-31 (×8): qty 1

## 2011-05-31 NOTE — Progress Notes (Signed)
Subjective: Less short of breath.  Objective: Vital signs in last 24 hours: Filed Vitals:   05/31/11 0447 05/31/11 0551 05/31/11 0842 05/31/11 1158  BP: 125/75     Pulse: 77     Temp: 97.3 F (36.3 C)     TempSrc: Oral     Resp: 20     Height:      Weight:  100.2 kg (220 lb 14.4 oz)    SpO2: 94%  94% 94%   Weight change:   Intake/Output Summary (Last 24 hours) at 05/31/11 1642 Last data filed at 05/31/11 1200  Gross per 24 hour  Intake   1560 ml  Output   3850 ml  Net  -2290 ml   Physical Exam: General: Comfortable. Lungs clear to auscultation bilaterally without wheeze rhonchi or rales Cardiovascular regular rhythm without murmurs gallops rubs Abdomen obese soft nontender Extremities slightly less edema.  Lab Results: Basic Metabolic Panel:  Lab 05/30/11 1610  NA 132*  K 4.4  CL 97  CO2 32  GLUCOSE 304*  BUN 24*  CREATININE 0.87  CALCIUM 8.6  MG --  PHOS --   Liver Function Tests:  Lab 05/30/11 1335  AST 14  ALT 19  ALKPHOS 98  BILITOT 0.3  PROT 6.3  ALBUMIN 2.8*    Lab 05/30/11 1335  LIPASE 14  AMYLASE --   No results found for this basename: AMMONIA:2 in the last 168 hours CBC:  Lab 05/30/11 1335  WBC 6.7  NEUTROABS 5.0  HGB 10.7*  HCT 36.0  MCV 80.7  PLT 258   Cardiac Enzymes:  Lab 05/30/11 1335  CKTOTAL 55  CKMB 3.5  CKMBINDEX --  TROPONINI <0.30   BNP:  Lab 05/30/11 1335  PROBNP 8423.0*   D-Dimer: No results found for this basename: DDIMER:2 in the last 168 hours CBG:  Lab 05/31/11 0138 05/30/11 2114  GLUCAP 387* 406*   Hemoglobin A1C:  Lab 05/30/11 1335  HGBA1C 8.2*   Fasting Lipid Panel: No results found for this basename: CHOL,HDL,LDLCALC,TRIG,CHOLHDL,LDLDIRECT in the last 960 hours Thyroid Function Tests:  Lab 05/30/11 1335  TSH 0.083*  T4TOTAL --  FREET4 --  T3FREE --  THYROIDAB --   Coagulation: No results found for this basename: LABPROT:4,INR:4 in the last 168 hours Anemia Panel: No results  found for this basename: VITAMINB12,FOLATE,FERRITIN,TIBC,IRON,RETICCTPCT in the last 168 hours Urinalysis: YELLOW APPearance CLEAR Specific Gravity, Urine 1.010 pH 5.5 Glucose, UA 1000 mg/dL">1000 Bilirubin Urine NEGATIVE Ketones, ur NEGATIVE Protein, ur NEGATIVE Urobilinogen, UA 0.2 Nitrite NEGATIVE Leukocytes, UA NEGATIVE WBC, UA 0-2 RBC / HPF 0-2 Squamous Epithelial / LPF RARE Bacteria, UA   Micro Results: No results found for this or any previous visit (from the past 240 hour(s)). Studies/Results: Dg Chest Portable 1 View  05/30/2011  *RADIOLOGY REPORT*  Clinical Data: Short of breath  PORTABLE CHEST - 1 VIEW  Comparison: Chest radiograph 10/03/2009  Findings: Stable enlarged heart silhouette.  There is mild central venous pulmonary congestion.  Left base is poorly evaluated.  No pneumothorax.  IMPRESSION: Cardiomegaly and  central venous pulmonary congestion.  Left base is poorly evaluated.  Original Report Authenticated By: Genevive Bi, M.D.   Scheduled Meds:   . albuterol  2.5 mg Nebulization Q4H  . aspirin  81 mg Oral Daily  . diltiazem  240 mg Oral Daily  . docusate sodium  100 mg Oral BID  . fish oil-omega-3 fatty acids  2 g Oral Daily  . Fluticasone-Salmeterol  1 puff Inhalation Q12H  .  furosemide  80 mg Intravenous BID  . heparin  5,000 Units Subcutaneous Q8H  . insulin pump   Subcutaneous TID AC, HS, 0200  . lactobacillus acidophilus & bulgar  1 tablet Oral TID WC  . losartan  100 mg Oral Daily  . metoprolol  25 mg Oral BID  . pantoprazole  40 mg Oral Daily  . pneumococcal 23 valent vaccine  0.5 mL Intramuscular Tomorrow-1000  . rosuvastatin  5 mg Oral q1800  . tiotropium  18 mcg Inhalation Daily  . DISCONTD: diltiazem  240 mg Oral Daily  . DISCONTD: metoprolol  50 mg Oral BID  . DISCONTD: simvastatin  20 mg Oral QHS   Continuous Infusions:  PRN Meds:.acetaminophen, albuterol, ALPRAZolam, alum & mag hydroxide-simeth, HYDROcodone-acetaminophen, ondansetron (ZOFRAN)  IV, ondansetron (ZOFRAN) IV, ondansetron, senna, traZODone, DISCONTD: acetaminophen Assessment/Plan: Principal Problem:  *Acute systolic congestive heart failure Active Problems:  DM (diabetes mellitus), type 2, uncontrolled  Benign essential HTN  COPD (chronic obstructive pulmonary disease)  Hepatitis C  Morbid obesity  Anxiety  RBBB (right bundle branch block with left anterior fascicular block)  Patient is already on beta blocker and ARB. Continue IV Lasix twice a day until less volume overloaded. She stop the aspirin on her own. I will resume this. She will need eventual evaluation by cardiology for ischemic workup. She prefers to see [facility].  I've instructed the patient to increase her insulin pump basal rate to 3 units per hour.   LOS: 1 day   Jaquane Boughner L 05/31/2011, 4:42 PM

## 2011-05-31 NOTE — Progress Notes (Signed)
*  PRELIMINARY RESULTS* Echocardiogram 2D Echocardiogram has been performed.  Kathryn Morgan 05/31/2011, 11:04 AM 

## 2011-05-31 NOTE — Progress Notes (Signed)
The patient has an insulin pump giving a basil rate of 2.5 units/hr.  The patient gave herself 6.1 unit bolus on 05/30/11 @ 2200 for a blood glucose of 406 and 3.0 unit bolus on 05/31/11 @ 0200 for a blood glucose of 387.  As well, she took a 1.0 bolus after changing her insulin cartridge around 0545 today.

## 2011-06-01 LAB — GLUCOSE, CAPILLARY
Glucose-Capillary: 249 mg/dL — ABNORMAL HIGH (ref 70–99)
Glucose-Capillary: 341 mg/dL — ABNORMAL HIGH (ref 70–99)
Glucose-Capillary: 412 mg/dL — ABNORMAL HIGH (ref 70–99)
Glucose-Capillary: 198 mg/dL — ABNORMAL HIGH (ref 70–99)

## 2011-06-01 LAB — BASIC METABOLIC PANEL
CO2: 39 mEq/L — ABNORMAL HIGH (ref 19–32)
Chloride: 95 mEq/L — ABNORMAL LOW (ref 96–112)
Creatinine, Ser: 0.88 mg/dL (ref 0.50–1.10)
GFR calc Af Amer: 76 mL/min — ABNORMAL LOW (ref >90)
Sodium: 138 mEq/L (ref 135–145)

## 2011-06-01 LAB — T4, FREE: Free T4: 0.97 ng/dL (ref 0.80–1.80)

## 2011-06-01 MED ORDER — BIOTENE DRY MOUTH MT LIQD
15.0000 mL | Freq: Two times a day (BID) | OROMUCOSAL | Status: DC
  Administered 2011-06-01 – 2011-06-07 (×7): 15 mL via OROMUCOSAL

## 2011-06-01 MED ORDER — PANTOPRAZOLE SODIUM 40 MG PO TBEC
40.0000 mg | DELAYED_RELEASE_TABLET | Freq: Two times a day (BID) | ORAL | Status: DC
  Administered 2011-06-01 – 2011-06-07 (×13): 40 mg via ORAL
  Filled 2011-06-01 (×13): qty 1

## 2011-06-01 MED ORDER — SPIRONOLACTONE 25 MG PO TABS
25.0000 mg | ORAL_TABLET | Freq: Every day | ORAL | Status: DC
  Administered 2011-06-01 – 2011-06-07 (×7): 25 mg via ORAL
  Filled 2011-06-01 (×7): qty 1

## 2011-06-01 MED ORDER — BIOTENE DRY MOUTH MT LIQD: 15.0000 mL | Freq: Two times a day (BID) | OROMUCOSAL | Status: DC

## 2011-06-01 NOTE — Progress Notes (Addendum)
Subjective: Became very dyspneic when getting up to the bedside commode. Required a nebulizer which helped.   Objective: Vital signs in last 24 hours: Filed Vitals:   05/31/11 2144 06/01/11 0500 06/01/11 0531 06/01/11 0705  BP: 122/76  125/79   Pulse: 83  71   Temp: 97.6 F (36.4 C)  97.8 F (36.6 C)   TempSrc: Oral  Oral   Resp: 20  20   Height:      Weight:  99.8 kg (220 lb 0.3 oz)    SpO2: 95%  97% 97%   Weight change: -2.259 kg (-4 lb 15.7 oz)  Intake/Output Summary (Last 24 hours) at 06/01/11 0912 Last data filed at 06/01/11 0500  Gross per 24 hour  Intake   1440 ml  Output   2750 ml  Net  -1310 ml   Physical Exam:  General: Comfortable. Lungs rales at the left base Cardiovascular regular rhythm without murmurs gallops rubs Abdomen obese soft nontender Extremities slightly less edema.  Lab Results: Basic Metabolic Panel:  Lab 06/01/11 1610 05/30/11 1335  NA 138 132*  K 4.1 4.4  CL 95* 97  CO2 39* 32  GLUCOSE 347* 304*  BUN 24* 24*  CREATININE 0.88 0.87  CALCIUM 9.1 8.6  MG -- --  PHOS -- --   Liver Function Tests:  Lab 05/30/11 1335  AST 14  ALT 19  ALKPHOS 98  BILITOT 0.3  PROT 6.3  ALBUMIN 2.8*    Lab 05/30/11 1335  LIPASE 14  AMYLASE --   No results found for this basename: AMMONIA:2 in the last 168 hours CBC:  Lab 05/30/11 1335  WBC 6.7  NEUTROABS 5.0  HGB 10.7*  HCT 36.0  MCV 80.7  PLT 258   Cardiac Enzymes:  Lab 05/30/11 1335  CKTOTAL 55  CKMB 3.5  CKMBINDEX --  TROPONINI <0.30   BNP:  Lab 05/30/11 1335  PROBNP 8423.0*   D-Dimer: No results found for this basename: DDIMER:2 in the last 168 hours CBG:  Lab 05/31/11 2142 05/31/11 0138 05/30/11 2114  GLUCAP 456* 387* 406*   Hemoglobin A1C:  Lab 05/30/11 1335  HGBA1C 8.2*   Fasting Lipid Panel: No results found for this basename: CHOL,HDL,LDLCALC,TRIG,CHOLHDL,LDLDIRECT in the last 960 hours Thyroid Function Tests:  Lab 05/30/11 1335  TSH 0.083*  T4TOTAL  --  FREET4 --  T3FREE --  THYROIDAB --   Coagulation: No results found for this basename: LABPROT:4,INR:4 in the last 168 hours Anemia Panel: No results found for this basename: VITAMINB12,FOLATE,FERRITIN,TIBC,IRON,RETICCTPCT in the last 168 hours Urinalysis: YELLOW APPearance CLEAR Specific Gravity, Urine 1.010 pH 5.5 Glucose, UA 1000 mg/dL">1000 Bilirubin Urine NEGATIVE Ketones, ur NEGATIVE Protein, ur NEGATIVE Urobilinogen, UA 0.2 Nitrite NEGATIVE Leukocytes, UA NEGATIVE WBC, UA 0-2 RBC / HPF 0-2 Squamous Epithelial / LPF RARE Bacteria, UA   Micro Results: No results found for this or any previous visit (from the past 240 hour(s)). Studies/Results: Dg Chest Portable 1 View  05/30/2011  *RADIOLOGY REPORT*  Clinical Data: Short of breath  PORTABLE CHEST - 1 VIEW  Comparison: Chest radiograph 10/03/2009  Findings: Stable enlarged heart silhouette.  There is mild central venous pulmonary congestion.  Left base is poorly evaluated.  No pneumothorax.  IMPRESSION: Cardiomegaly and  central venous pulmonary congestion.  Left base is poorly evaluated.  Original Report Authenticated By: Genevive Bi, M.D.   Scheduled Meds:    . albuterol  2.5 mg Nebulization Q4H  . antiseptic oral rinse  15 mL Mouth Rinse BID  .  aspirin  81 mg Oral Daily  . diltiazem  240 mg Oral Daily  . docusate sodium  100 mg Oral BID  . fish oil-omega-3 fatty acids  2 g Oral Daily  . Fluticasone-Salmeterol  1 puff Inhalation Q12H  . furosemide  80 mg Intravenous BID  . heparin  5,000 Units Subcutaneous Q8H  . insulin pump   Subcutaneous TID AC, HS, 0200  . lactobacillus acidophilus & bulgar  1 tablet Oral TID WC  . losartan  100 mg Oral Daily  . metoprolol  25 mg Oral BID  . pantoprazole  40 mg Oral Daily  . pneumococcal 23 valent vaccine  0.5 mL Intramuscular Tomorrow-1000  . rosuvastatin  5 mg Oral q1800  . spironolactone  25 mg Oral Daily  . tiotropium  18 mcg Inhalation Daily   Continuous Infusions:    PRN Meds:.acetaminophen, albuterol, ALPRAZolam, alum & mag hydroxide-simeth, HYDROcodone-acetaminophen, ondansetron (ZOFRAN) IV, ondansetron, senna, traZODone Assessment/Plan: Principal Problem:  *Acute systolic congestive heart failure Active Problems:  DM (diabetes mellitus), type 2, uncontrolled  Benign essential HTN  COPD (chronic obstructive pulmonary disease)  Hepatitis C  Morbid obesity  Anxiety  RBBB (right bundle branch block with left anterior fascicular block)  abnormal TSH  Continue IV Lasix twice daily. Beta blocker. ARB. At spironolactone. Continue aspirin daily. She needs diet education and CHF education. She still requires more diuresis prior to discharge. Cardiology should be back tomorrow and I will consult them. She prefers [facility] cardiology. Also, if Dr. Fransico Him is available, will consult him for assistance. Patient is hesitant to increase her insulin pump basal rate because of previous night time hypoglycemic episodes. She did agree to increase it to 3.3. I will consult dietitian and ask nursing to provide CHF education. She is debilitated and may end up needing a PT consult once she has less volume overloaded. Her COPD is stable at this time and blood pressure is good. Check free T4, free T3.   LOS: 2 days   Kathryn Morgan 06/01/2011, 9:12 AM

## 2011-06-01 NOTE — Plan of Care (Signed)
Problem: Phase I Progression Outcomes Goal: Other Phase I Outcomes/Goals Patient received booklet on heart failure and teaching done on book.

## 2011-06-01 NOTE — Progress Notes (Signed)
The patient has an insulin pump giving a basil rate of 3.0 units/hr.  The patient gave herself 12.0 unit bolus on 05/31/11 @ 2200 for a blood glucose of 456 and 6.3 unit bolus on 06/01/11 @ 0200 for a blood glucose of 412.

## 2011-06-02 ENCOUNTER — Inpatient Hospital Stay (HOSPITAL_COMMUNITY): Payer: Medicare Other

## 2011-06-02 ENCOUNTER — Encounter (HOSPITAL_COMMUNITY): Payer: Self-pay | Admitting: Cardiology

## 2011-06-02 DIAGNOSIS — I509 Heart failure, unspecified: Secondary | ICD-10-CM

## 2011-06-02 DIAGNOSIS — I359 Nonrheumatic aortic valve disorder, unspecified: Secondary | ICD-10-CM

## 2011-06-02 DIAGNOSIS — I350 Nonrheumatic aortic (valve) stenosis: Secondary | ICD-10-CM

## 2011-06-02 LAB — GLUCOSE, CAPILLARY
Glucose-Capillary: 188 mg/dL — ABNORMAL HIGH (ref 70–99)
Glucose-Capillary: 57 mg/dL — ABNORMAL LOW (ref 70–99)
Glucose-Capillary: 84 mg/dL (ref 70–99)
Glucose-Capillary: 78 mg/dL (ref 70–99)
Glucose-Capillary: 100 mg/dL — ABNORMAL HIGH (ref 70–99)

## 2011-06-02 MED ORDER — SODIUM CHLORIDE 0.9 % IJ SOLN: 3.0000 mL | INTRAMUSCULAR | Status: DC | PRN

## 2011-06-02 MED ORDER — ASPIRIN 81 MG PO CHEW
324.0000 mg | CHEWABLE_TABLET | ORAL | Status: AC
  Administered 2011-06-03: 324 mg via ORAL
  Filled 2011-06-02: qty 4

## 2011-06-02 MED ORDER — SODIUM CHLORIDE 0.9 % IJ SOLN
INTRAMUSCULAR | Status: AC
  Administered 2011-06-02: 3 mL
  Filled 2011-06-02: qty 3

## 2011-06-02 MED ORDER — SODIUM CHLORIDE 0.9 % IJ SOLN: 3.0000 mL | Freq: Two times a day (BID) | INTRAMUSCULAR | Status: DC

## 2011-06-02 MED ORDER — SODIUM CHLORIDE 0.9 % IV SOLN: 250.0000 mL | INTRAVENOUS | Status: DC | PRN

## 2011-06-02 MED ORDER — FUROSEMIDE 10 MG/ML IJ SOLN
80.0000 mg | Freq: Two times a day (BID) | INTRAMUSCULAR | Status: DC
  Administered 2011-06-03 – 2011-06-04 (×3): 80 mg via INTRAVENOUS
  Filled 2011-06-02 (×7): qty 8

## 2011-06-02 MED ORDER — OMEGA-3-ACID ETHYL ESTERS 1 G PO CAPS
2.0000 g | ORAL_CAPSULE | Freq: Two times a day (BID) | ORAL | Status: DC
  Administered 2011-06-02 – 2011-06-05 (×7): 2 g via ORAL
  Administered 2011-06-05: 1 g via ORAL
  Administered 2011-06-06: 2 g via ORAL
  Administered 2011-06-06: 1 g via ORAL
  Administered 2011-06-07: 2 g via ORAL
  Filled 2011-06-02 (×12): qty 2

## 2011-06-02 MED ORDER — METOPROLOL TARTRATE 50 MG PO TABS
50.0000 mg | ORAL_TABLET | Freq: Two times a day (BID) | ORAL | Status: DC
  Administered 2011-06-02 – 2011-06-04 (×6): 50 mg via ORAL
  Filled 2011-06-02 (×8): qty 1

## 2011-06-02 MED ORDER — SODIUM CHLORIDE 0.45 % IV SOLN: Freq: Once | INTRAVENOUS | Status: DC

## 2011-06-02 NOTE — Progress Notes (Signed)
Nutrition Edu Consult  Pt edu r/t New CHF and DM diet canceled per MD due to pt being transferred today to [facility].  Dietitian: [phone]

## 2011-06-02 NOTE — Progress Notes (Signed)
The patient has an insulin pump giving a basil rate of 3.3 units/hr.  The patient gave herself 0.0 unit bolus on 06/01/11 @ 2200 for a blood glucose of 176 and 0.0 unit bolus on 06/02/11 @ 0200 for a blood glucose of 188.

## 2011-06-02 NOTE — Consults (Signed)
HPI: 70 year old female with no prior cardiac history for evaluation of congestive heart failure. The patient has chronic dyspnea on exertion that she attributes to COPD. However she states 6 months ago she could walk 1/2 mile before becoming short of breath. Over the past 6 weeks she has had progressive dyspnea on exertion, orthopnea, PND and pedal edema. She was started on Lasix by her primary care physician but her symptoms worsened and she was admitted on December 23. She has also had chest tightness. It increases with lying flat and improves with sitting up. Echocardiogram on December 24 showed an ejection fraction of 35% and moderate left ventricular hypertrophy. There was severe aortic stenosis with a peak gradient of 70 mm of mercury and a mean gradient of 51 mm of mercury. Cardiology is now asked to evaluate.  Medications Prior to Admission  Medication Dose Route Frequency Provider Last Rate Last Dose  . acetaminophen (TYLENOL) tablet 650 mg  650 mg Oral Q6H PRN Christiane Ha      . albuterol (PROVENTIL) (5 MG/ML) 0.5% nebulizer solution 2.5 mg  2.5 mg Nebulization Q4H Dayton Bailiff, MD   2.5 mg at 05/31/11 1153  . albuterol (PROVENTIL) (5 MG/ML) 0.5% nebulizer solution 2.5 mg  2.5 mg Nebulization Q2H PRN Corinna L Sullivan   2.5 mg at 06/01/11 1924  . albuterol (PROVENTIL) (5 MG/ML) 0.5% nebulizer solution 5 mg  5 mg Nebulization Once Dayton Bailiff, MD   5 mg at 05/30/11 1403  . albuterol (PROVENTIL) (5 MG/ML) 0.5% nebulizer solution 5 mg  5 mg Nebulization Once Dayton Bailiff, MD   5 mg at 05/30/11 1509  . ALPRAZolam Prudy Feeler) tablet 0.5 mg  0.5 mg Oral QID PRN Christiane Ha      . alum & mag hydroxide-simeth (MAALOX/MYLANTA) 200-200-20 MG/5ML suspension 30 mL  30 mL Oral Q6H PRN Christiane Ha      . antiseptic oral rinse (BIOTENE) solution 15 mL  15 mL Mouth Rinse BID Valeda Malm   15 mL at 06/01/11 2215  . aspirin chewable tablet 81 mg  81 mg Oral Daily Corinna L Sullivan   81  mg at 06/01/11 1024  . diltiazem (CARDIZEM CD) 24 hr capsule 240 mg  240 mg Oral Daily Francisco J Valls, PHARMD   240 mg at 06/01/11 1025  . docusate sodium (COLACE) capsule 100 mg  100 mg Oral BID Corinna L Sullivan   100 mg at 06/01/11 2154  . Fluticasone-Salmeterol (ADVAIR) 250-50 MCG/DOSE inhaler 1 puff  1 puff Inhalation Q12H Corinna L Sullivan   1 puff at 06/02/11 0808  . furosemide (LASIX) injection 40 mg  40 mg Intravenous Once Dayton Bailiff, MD   40 mg at 05/30/11 1502  . furosemide (LASIX) injection 80 mg  80 mg Intravenous BID Corinna L Sullivan   80 mg at 06/01/11 1524  . heparin injection 5,000 Units  5,000 Units Subcutaneous Q8H Corinna L Sullivan   5,000 Units at 06/02/11 0509  . HYDROcodone-acetaminophen (NORCO) 5-325 MG per tablet 1-2 tablet  1-2 tablet Oral Q4H PRN Christiane Ha      . insulin pump   Subcutaneous TID AC, HS, 0200 Corinna L Sullivan      . ipratropium (ATROVENT) nebulizer solution 0.5 mg  0.5 mg Nebulization Once Dayton Bailiff, MD   0.5 mg at 05/30/11 1403  . ipratropium (ATROVENT) nebulizer solution 0.5 mg  0.5 mg Nebulization Once Dayton Bailiff, MD   0.5 mg at 05/30/11 1509  .  lactobacillus acidophilus & bulgar (LACTINEX) chewable tablet 1 tablet  1 tablet Oral TID WC Corinna L Sullivan   1 tablet at 06/01/11 1806  . losartan (COZAAR) tablet 100 mg  100 mg Oral Daily Corinna L Sullivan   100 mg at 06/01/11 1025  . methylPREDNISolone sodium succinate (SOLU-MEDROL) 125 MG injection 125 mg  125 mg Intravenous Once Dayton Bailiff, MD   125 mg at 05/30/11 1454  . metoprolol tartrate (LOPRESSOR) tablet 25 mg  25 mg Oral BID Gokul Krishnan   25 mg at 06/01/11 2154  . omega-3 acid ethyl esters (LOVAZA) capsule 2 g  2 g Oral BID Corinna Burman Freestone      . ondansetron (ZOFRAN) injection 4 mg  4 mg Intravenous Q8H PRN Dayton Bailiff, MD      . ondansetron [facility]) tablet 4 mg  4 mg Oral Q6H PRN Corinna L Sullivan       Or  . ondansetron (ZOFRAN) injection 4 mg  4 mg Intravenous  Q6H PRN Corinna L Sullivan      . pantoprazole (PROTONIX) EC tablet 40 mg  40 mg Oral BID AC Corinna L Sullivan   40 mg at 06/02/11 0700  . pneumococcal 23 valent vaccine (PNU-IMMUNE) injection 0.5 mL  0.5 mL Intramuscular Tomorrow-1000 Corinna L Sullivan      . rosuvastatin (CRESTOR) tablet 5 mg  5 mg Oral q1800 Francisco J [place], PHARMD   5 mg at 06/01/11 1805  . senna (SENOKOT) tablet 17.2 mg  2 tablet Oral Daily PRN Corinna L Sullivan   17.2 mg at 06/02/11 1610  . sodium chloride 0.9 % injection           . spironolactone (ALDACTONE) tablet 25 mg  25 mg Oral Daily Corinna L Sullivan   25 mg at 06/01/11 1025  . tiotropium (SPIRIVA) inhalation capsule 18 mcg  18 mcg Inhalation Daily Corinna L Sullivan   18 mcg at 06/02/11 0809  . traZODone (DESYREL) tablet 25 mg  25 mg Oral QHS PRN Christiane Ha      . DISCONTD: acetaminophen (TYLENOL) suppository 650 mg  650 mg Rectal Q6H PRN Christiane Ha      . DISCONTD: antiseptic oral rinse (BIOTENE) solution 15 mL  15 mL Mouth Rinse BID Corinna Burman [place]      . DISCONTD: diltiazem (DILACOR XR) 24 hr capsule 240 mg  240 mg Oral Daily Corinna Burman Freestone      . DISCONTD: fish oil-omega-3 fatty acids capsule 2 g  2 g Oral Daily Corinna L Sullivan   2 g at 06/01/11 1027  . DISCONTD: metoprolol (LOPRESSOR) tablet 50 mg  50 mg Oral BID Corinna Burman Fre[place]      . DISCONTD: pantoprazole (PROTONIX) EC tablet 40 mg  40 mg Oral Daily Corinna L Sullivan   40 mg at 06/01/11 1025  . DISCONTD: simvastatin (ZOCOR) tablet 20 mg  20 mg Oral QHS Corinna L Sullivan   20 mg at 05/30/11 2251   Medications Prior to Admission  Medication Sig Dispense Refill  . diltiazem (DILACOR XR) 240 MG 24 hr capsule Take 240 mg by mouth daily.        Marland Kitchen docusate sodium (COLACE) 100 MG capsule Take 100 mg by mouth 2 (two) times daily.        . fish oil-omega-3 fatty acids 1000 MG capsule Take 2 g by mouth daily.        . Fluticasone-Salmeterol (ADVAIR DISKUS) 250-50 MCG/DOSE AEPB  Inhale  1 puff into the lungs every 12 (twelve) hours.        . furosemide (LASIX) 20 MG tablet Take 20 mg by mouth 2 (two) times daily.        Marland Kitchen LORazepam (ATIVAN) 1 MG tablet Take 1 mg by mouth every 8 (eight) hours as needed. anxiety      . losartan (COZAAR) 100 MG tablet Take 100 mg by mouth daily as needed. When blood gets to 140/80      . metoprolol (LOPRESSOR) 50 MG tablet Take 50 mg by mouth 2 (two) times daily. 1/2 tablet bid       . pantoprazole (PROTONIX) 40 MG tablet Take 1 tablet (40 mg total) by mouth daily. Take 30 minutes before breakfast  30 tablet  1  . simvastatin (ZOCOR) 20 MG tablet Take 20 mg by mouth at bedtime.        Marland Kitchen tiotropium (SPIRIVA) 18 MCG inhalation capsule Place 18 mcg into inhaler and inhale daily.        . insulin regular (NOVOLIN R,HUMULIN R) 100 units/mL injection Inject into the skin. Insulin pump 2.5 units every hours         Allergies  Allergen Reactions  . Codeine Other (See Comments)    Does not know  . Phenergan     Past Medical History  Diagnosis Date  . DM (diabetes mellitus)   . HTN (hypertension)   . Dyslipidemia   . Hepatitis C     per pt, hx of  . Depression   . Necrotizing pancreatitis   . Neuropathy   . Rhabdomyolysis   . Gallstone pancreatitis     2005    Past Surgical History  Procedure Date  . Esophagogastroduodenoscopy 04/21/04    normal esophagus/normal D1,D2; attempted  transgastric drainage/stent placement of pancreatic pseudocyst, no obvious location for gastrotomy, therefore transferred to [facility]  . Debridement pancreas   . Pancreatic pseudocyst drainage   . Cholecystectomy     History   Social History  . Marital Status: Married    Spouse Name: N/A    Number of Children: N/A  . Years of Education: N/A   Occupational History  . Not on file.   Social History Main Topics  . Smoking status: Former Smoker -- 1.0 packs/day    Types: Cigarettes  . Smokeless tobacco: Not on file   Comment: quit about 20+ yrs  ago  . Alcohol Use: No  . Drug Use: No  . Sexually Active: Not on file   Other Topics Concern  . Not on file   Social History Narrative  . No narrative on file    Family History  Problem Relation Age of Onset  . Colon cancer Neg Hx   . Heart disease      CHF    ROS:  no fevers or chills, productive cough, hemoptysis, dysphasia, odynophagia, melena, hematochezia, dysuria, hematuria, rash, seizure activity, claudication. Remaining systems are negative.  Physical Exam:   Blood pressure 145/79, pulse 72, temperature 97.8 F (36.6 C), temperature source Oral, resp. rate 20, height 5\' 4"  (1.626 m), weight 218 lb 7.6 oz (99.1 kg), SpO2 93.00%.  General:  Well developed/obese in NAD Skin warm/dry Patient not depressed No peripheral clubbing Back-normal HEENT-normal/normal eyelids Neck supple/normal carotid upstroke bilaterally; bilateral bruits; no thyromegaly chest - diminished breath sounds at the bases with mild crackles. CV - RRR/normal S1 and S2; no rubs or gallops;  PMI nondisplaced; 3/6 systolic murmur left sternal border radiating to  the carotids. Abdomen -NT/ND, no HSM, no mass, + bowel sounds, no bruit 2+ femoral pulses, no bruits Ext-1+ edema, no chords, 2+ DP Neuro-grossly nonfocal  ECG sinus rhythm, left atrial enlargement, right bundle branch block, lateral T-wave inversion.  Results for orders placed during the hospital encounter of 05/30/11 (from the past 48 hour(s))  GLUCOSE, CAPILLARY     Status: Abnormal   Collection Time   05/31/11 11:07 AM      Component Value Range Comment   Glucose-Capillary 213 (*) 70 - 99 (mg/dL)    Comment 1 Notify RN      Comment 2 Documented in Chart     GLUCOSE, CAPILLARY     Status: Abnormal   Collection Time   05/31/11  4:22 PM      Component Value Range Comment   Glucose-Capillary 341 (*) 70 - 99 (mg/dL)    Comment 1 Notify RN      Comment 2 Documented in Chart     GLUCOSE, CAPILLARY     Status: Abnormal   Collection  Time   05/31/11  9:42 PM      Component Value Range Comment   Glucose-Capillary 456 (*) 70 - 99 (mg/dL)   GLUCOSE, CAPILLARY     Status: Abnormal   Collection Time   06/01/11  2:10 AM      Component Value Range Comment   Glucose-Capillary 412 (*) 70 - 99 (mg/dL)   T4, FREE     Status: Normal   Collection Time   06/01/11  4:55 AM      Component Value Range Comment   Free T4 0.97  0.80 - 1.80 (ng/dL)   T3, FREE     Status: Abnormal   Collection Time   06/01/11  4:55 AM      Component Value Range Comment   T3, Free 2.2 (*) 2.3 - 4.2 (pg/mL)   BASIC METABOLIC PANEL     Status: Abnormal   Collection Time   06/01/11  4:59 AM      Component Value Range Comment   Sodium 138  135 - 145 (mEq/L)    Potassium 4.1  3.5 - 5.1 (mEq/L)    Chloride 95 (*) 96 - 112 (mEq/L)    CO2 39 (*) 19 - 32 (mEq/L)    Glucose, Bld 347 (*) 70 - 99 (mg/dL)    BUN 24 (*) 6 - 23 (mg/dL)    Creatinine, Ser 7.82  0.50 - 1.10 (mg/dL)    Calcium 9.1  8.4 - 10.5 (mg/dL)    GFR calc non Af Amer 66 (*) >90 (mL/min)    GFR calc Af Amer 76 (*) >90 (mL/min)   GLUCOSE, CAPILLARY     Status: Abnormal   Collection Time   06/01/11  8:08 AM      Component Value Range Comment   Glucose-Capillary 186 (*) 70 - 99 (mg/dL)    Comment 1 Notify RN      Comment 2 Documented in Chart     GLUCOSE, CAPILLARY     Status: Abnormal   Collection Time   06/01/11 11:41 AM      Component Value Range Comment   Glucose-Capillary 198 (*) 70 - 99 (mg/dL)   GLUCOSE, CAPILLARY     Status: Abnormal   Collection Time   06/01/11  4:49 PM      Component Value Range Comment   Glucose-Capillary 293 (*) 70 - 99 (mg/dL)   GLUCOSE, CAPILLARY     Status: Abnormal  Collection Time   06/01/11 10:06 PM      Component Value Range Comment   Glucose-Capillary 176 (*) 70 - 99 (mg/dL)    Comment 1 Notify RN     GLUCOSE, CAPILLARY     Status: Abnormal   Collection Time   06/02/11  2:07 AM      Component Value Range Comment   Glucose-Capillary 188  (*) 70 - 99 (mg/dL)    Comment 1 Notify RN     GLUCOSE, CAPILLARY     Status: Abnormal   Collection Time   06/02/11  7:57 AM      Component Value Range Comment   Glucose-Capillary 57 (*) 70 - 99 (mg/dL)    Comment 1 Documented in Chart      Comment 2 Notify RN     GLUCOSE, CAPILLARY     Status: Normal   Collection Time   06/02/11  8:26 AM      Component Value Range Comment   Glucose-Capillary 84  70 - 99 (mg/dL)    Comment 1 Notify RN      Comment 2 Documented in Chart       No results found.  Assessment/Plan Patient Active Hospital Problem List:  #1-acute systolic congestive heart failure-patient presents with symptoms of congestive heart failure and her ejection fraction is 35%. She also has severe aortic stenosis. She will need aortic valve replacement. I will transfer her to [facility] and we will proceed with right and left cardiac catheterization. She will also need preoperative carotid Dopplers although her bruits are most likely related to her aortic stenosis murmur with radiation. The risks and benefits of cardiac catheterization were discussed and she agrees to proceed. She will need CVTS to evaluate following cardiac catheterization. I will hold her Lasix tomorrow morning and this evening in anticipation of catheterization. Continue ARB and beta blocker (increase metoprolol to 50 BID). Discontinue calcium blocker. #2-reduced LV function-continue ARB and increase beta blocker as outlined. #3-aortic stenosis-aortic valve replacement as outlined above. #4-diabetes mellitus-follow CBGs and adjust regimen as needed. #5 hypertension-follow blood pressure and increase medications as needed. #6-decreased TSH-question euthyroid sick; repeat TSH at [facility] #7-COPD-continue bronchodilators. She will need preoperative pulmonary function tests.   Olga Millers MD 06/02/2011, 9:14 AM

## 2011-06-02 NOTE — Progress Notes (Signed)
Patient being transferred to [facility] for Right and Left Heart Cath on 06/03/2011. She will have PFT's and carotid doppler studies tomorrow.Please see Dr. Ludwig Clarks note for more details.

## 2011-06-02 NOTE — Progress Notes (Signed)
Patient with insulin pump.  Last site change on 12/24.  Patient increased basal rate to 3.3 units/ hour yesterday.  Agree with change from 2.5 units/ hour.  However, hypoglycemic this am.  Consider decreasing basal rate to 3.0 during night beginning at midnight until 8 am.  Dr. Fransico Him consulted.  In addition, patient to transfer to [facility] for cardiac cath.  Unsure whether patient will remain at [facility].  If transferred to [facility], Diabetes Coordinator will follow at this facility as well.

## 2011-06-02 NOTE — Progress Notes (Signed)
06/02/11 1458 Patient transferred to [facility] department 4700, report given to [facility] nurse this afternoon, left floor in stable condition via [facility] transport. Called report to [place], RN receiving nurse at [phone].

## 2011-06-02 NOTE — Progress Notes (Signed)
Pt received to 4700 bed 4743 via stretcher from [facility].  Pt oriented to room and hospital policies, assessment completed and VSS. Will continue to monitor pt and will carry out new orders. Toney Reil RN

## 2011-06-03 ENCOUNTER — Encounter (HOSPITAL_COMMUNITY)
Admission: EM | Disposition: A | Discharge status: Home / Self Care | Payer: Self-pay | Source: Home / Self Care | Attending: Internal Medicine

## 2011-06-03 DIAGNOSIS — I359 Nonrheumatic aortic valve disorder, unspecified: Secondary | ICD-10-CM

## 2011-06-03 HISTORY — PX: LEFT AND RIGHT HEART CATHETERIZATION WITH CORONARY ANGIOGRAM: SHX5449

## 2011-06-03 LAB — POCT I-STAT 3, VENOUS BLOOD GAS (G3P V)
Acid-Base Excess: 11 mmol/L — ABNORMAL HIGH (ref 0.0–2.0)
Bicarbonate: 39 mEq/L — ABNORMAL HIGH (ref 20.0–24.0)
TCO2: 41 mmol/L (ref 0–100)

## 2011-06-03 LAB — POCT I-STAT 3, ART BLOOD GAS (G3+)
Acid-Base Excess: 12 mmol/L — ABNORMAL HIGH (ref 0.0–2.0)
Bicarbonate: 39 mEq/L — ABNORMAL HIGH (ref 20.0–24.0)
TCO2: 41 mmol/L (ref 0–100)

## 2011-06-03 LAB — CBC
HCT: 42.7 % (ref 36.0–46.0)
MCHC: 30.7 g/dL (ref 30.0–36.0)
MCV: 79.4 fL (ref 78.0–100.0)
RDW: 17.6 % — ABNORMAL HIGH (ref 11.5–15.5)

## 2011-06-03 LAB — GLUCOSE, CAPILLARY
Glucose-Capillary: 63 mg/dL — ABNORMAL LOW (ref 70–99)
Glucose-Capillary: 91 mg/dL (ref 70–99)

## 2011-06-03 SURGERY — LEFT AND RIGHT HEART CATHETERIZATION WITH CORONARY ANGIOGRAM
Anesthesia: LOCAL

## 2011-06-03 MED ORDER — LIDOCAINE HCL (PF) 1 % IJ SOLN
INTRAMUSCULAR | Status: AC
  Filled 2011-06-03: qty 30

## 2011-06-03 MED ORDER — DEXTROSE 50 % IV SOLN
INTRAVENOUS | Status: AC
  Administered 2011-06-03: 25 mL
  Filled 2011-06-03: qty 50

## 2011-06-03 MED ORDER — NITROGLYCERIN 0.2 MG/ML ON CALL CATH LAB
INTRAVENOUS | Status: AC
  Filled 2011-06-03: qty 1

## 2011-06-03 MED ORDER — SODIUM CHLORIDE 0.9 % IV SOLN: 250.0000 mL | INTRAVENOUS | Status: DC | PRN

## 2011-06-03 MED ORDER — SODIUM CHLORIDE 0.9 % IJ SOLN
3.0000 mL | Freq: Two times a day (BID) | INTRAMUSCULAR | Status: DC
  Administered 2011-06-03 – 2011-06-07 (×9): 3 mL via INTRAVENOUS

## 2011-06-03 MED ORDER — HEPARIN SODIUM (PORCINE) 5000 UNIT/ML IJ SOLN: 5000.0000 [IU] | Freq: Three times a day (TID) | INTRAMUSCULAR | Status: DC

## 2011-06-03 MED ORDER — FUROSEMIDE 10 MG/ML IJ SOLN
80.0000 mg | Freq: Once | INTRAMUSCULAR | Status: AC
  Administered 2011-06-03: 80 mg via INTRAVENOUS

## 2011-06-03 MED ORDER — HEPARIN (PORCINE) IN NACL 2-0.9 UNIT/ML-% IJ SOLN
INTRAMUSCULAR | Status: AC
  Filled 2011-06-03: qty 2000

## 2011-06-03 MED ORDER — MIDAZOLAM HCL 2 MG/2ML IJ SOLN
INTRAMUSCULAR | Status: AC
  Filled 2011-06-03: qty 2

## 2011-06-03 MED ORDER — FUROSEMIDE 10 MG/ML IJ SOLN
INTRAMUSCULAR | Status: AC
  Filled 2011-06-03: qty 8

## 2011-06-03 MED ORDER — SODIUM CHLORIDE 0.9 % IJ SOLN: 3.0000 mL | INTRAMUSCULAR | Status: DC | PRN

## 2011-06-03 NOTE — Op Note (Signed)
Cardiac Catheterization Operative Report  Kathryn Morgan [id number] 12/27/20129:39 AM [name], MD  Procedure Performed:  1. Left Heart Catheterization 2. Selective Coronary Angiography 3. Right Heart Catheterization 4. Left ventricular angiogram  Operator: Verne Carrow, MD  Indication: Aortic stenosis, CHF                                         Procedure Details: The risks, benefits, complications, treatment options, and expected outcomes were discussed with the patient. The patient and/or family concurred with the proposed plan, giving informed consent. The patient was brought to the cath lab after IV hydration was begun and oral premedication was given. The patient was further sedated with Versed. The right groin was prepped and draped in the usual manner. Using the modified Seldinger access technique, a 5 French sheath was placed in the right femoral artery. A 7 French sheath was inserted into the right femoral vein. A balloon tipped catheter was used to perform a right heart catheterization. Standard diagnostic catheters were used to perform selective coronary angiography. I crossed the aortic valve with an AL2 catheter and the J wire. There were no immediate complications. The patient was taken to the recovery area in stable condition.   Coronary Interventions: None  Hemodynamic Findings: Ao:  153/90                                             LV: 175/23/28 RA:  13                                                   RV: 65/11/16 PA:   64/33 (mean 44)                           PCWP:   30 Fick Cardiac Output: 4.64 L/min Fick Cardiac Index: 2.29 L/min/m2 Central Aortic Saturation: 97% Pulmonary Artery Saturation: 57%  Aortic Valve: Peak to peak gradient                      Mean gradient 15 mmHg                      AVA 1.2 cm2                      AVA index 0.55   Angiographic Findings:  Left main: No evidence of disease.   Left Anterior Descending  Artery:  Large vessel that wraps around the apex. No disease. Moderate sized diagonal branch.   Circumflex Artery: Moderate sized vessel with moderate sized OM1. No disease.   Right Coronary Artery: Moderate sized dominant vessel. No disease.   Left Ventricular Angiogram: Not performed.   Impression: 1. No angiographic evidence of CAD 2. Moderate aortic valve stenosis based on cath findings.  3. CHF with volume overload  Recommendations: I do not think her aortic valve disease is severe enough to warrant AVR at this time. She will be diuresed aggressively. Medical management of non-ischemic cardiomyopathy.        Complications:  None;  patient tolerated the procedure well.

## 2011-06-03 NOTE — H&P (View-Only) (Signed)
Patient being transferred to [facility] for Right and Left Heart Cath on 06/03/2011. She will have PFT's and carotid doppler studies tomorrow.Please see Dr. Crenshaw's note for more details.   

## 2011-06-03 NOTE — Interval H&P Note (Signed)
History and Physical Interval Note:  06/03/2011 9:03 AM  Kathryn Morgan  has presented today for cath with the diagnosis of Severe Aortic Stenosis, and Systolic dysfx  The various methods of treatment have been discussed with the patient and family. After consideration of risks, benefits and other options for treatment, the patient has consented to  Procedure(s): LEFT AND RIGHT HEART CATHETERIZATION WITH CORONARY ANGIOGRAM as a surgical intervention .  The patients' history has been reviewed, patient examined, no change in status, stable for surgery.  I have reviewed the patients' chart and labs.  Questions were answered to the patient's satisfaction.     [name],[name]

## 2011-06-03 NOTE — Progress Notes (Signed)
06/03/11  Spoke with patient about her insulin pump.  Has an Accuchek pump that she has had for about 6 years.  Her basal rate is 3.3 units/hour.  Covers her food by blood sugar at the time, not by number of carbohydrates. Sees Dr. Kandy Garrison in [place], but has an appointment with endocrinologist, Dr. Fransico Him, in January.  Will tell nurses what dosage she boluses herself. Will continue to follow while in hospital.  Smith Mince RN BSN

## 2011-06-04 ENCOUNTER — Encounter (HOSPITAL_COMMUNITY): Payer: Self-pay

## 2011-06-04 LAB — CBC
MCH: 24 pg — ABNORMAL LOW (ref 26.0–34.0)
MCHC: 30.2 g/dL (ref 30.0–36.0)
Platelets: 304 10*3/uL (ref 150–400)
WBC: 8.9 10*3/uL (ref 4.0–10.5)

## 2011-06-04 LAB — BASIC METABOLIC PANEL
CO2: 37 mEq/L — ABNORMAL HIGH (ref 19–32)
Chloride: 89 mEq/L — ABNORMAL LOW (ref 96–112)
Creatinine, Ser: 0.88 mg/dL (ref 0.50–1.10)

## 2011-06-04 LAB — GLUCOSE, CAPILLARY: Glucose-Capillary: 254 mg/dL — ABNORMAL HIGH (ref 70–99)

## 2011-06-04 LAB — PRO B NATRIURETIC PEPTIDE: Pro B Natriuretic peptide (BNP): 10389 pg/mL — ABNORMAL HIGH (ref 0–125)

## 2011-06-04 NOTE — Progress Notes (Signed)
Pt foley cath removed with no complications. Pt felt relief. Will continue to monitor for urine output.

## 2011-06-04 NOTE — Progress Notes (Signed)
SUBJECTIVE: she has diuresed well. Breathing is much better. No chest pain or SOB this am. LE edema resolved.   BP 121/80  Pulse 98  Temp(Src) 98 F (36.7 C) (Oral)  Resp 20  Ht 5\' 5"  (1.651 m)  Wt 201 lb 1 oz (91.2 kg)  BMI 33.46 kg/m2  SpO2 95%  Intake/Output Summary (Last 24 hours) at 06/04/11 0912 Last data filed at 06/04/11 0750  Gross per 24 hour  Intake   1083 ml  Output   4800 ml  Net  -3717 ml    PHYSICAL EXAM General: Well developed, well nourished, in no acute distress. Alert and oriented x 3.  Psych:  Good affect, responds appropriately Neck: No JVD. No masses noted.  Lungs: Clear bilaterally with no wheezes or rhonci noted.  Heart: RRR with no murmurs noted. Abdomen: Bowel sounds are present. Soft, non-tender.  Extremities: No lower extremity edema.   LABS: Basic Metabolic Panel:  Basename 06/03/11 1217  NA --  K --  CL --  CO2 --  GLUCOSE --  BUN --  CREATININE 0.78  CALCIUM --  MG --  PHOS --   CBC:  Basename 06/03/11 1217  WBC 8.7  NEUTROABS --  HGB 13.1  HCT 42.7  MCV 79.4  PLT 270    Current Meds:    . antiseptic oral rinse  15 mL Mouth Rinse BID  . aspirin  81 mg Oral Daily  . docusate sodium  100 mg Oral BID  . Fluticasone-Salmeterol  1 puff Inhalation Q12H  . furosemide  80 mg Intravenous BID  . furosemide  80 mg Intravenous Once  . heparin      . heparin  5,000 Units Subcutaneous Q8H  . insulin pump   Subcutaneous TID AC, HS, 0200  . lactobacillus acidophilus & bulgar  1 tablet Oral TID WC  . lidocaine      . losartan  100 mg Oral Daily  . metoprolol  50 mg Oral BID  . midazolam      . nitroGLYCERIN      . omega-3 acid ethyl esters  2 g Oral BID  . pantoprazole  40 mg Oral BID AC  . rosuvastatin  5 mg Oral q1800  . sodium chloride  3 mL Intravenous Q12H  . spironolactone  25 mg Oral Daily  . tiotropium  18 mcg Inhalation Daily  . DISCONTD: sodium chloride   Intravenous Once  . DISCONTD: heparin  5,000 Units  Subcutaneous Q8H     ASSESSMENT AND PLAN:70 yo WF with CHF transferred from [facility] yesterday for right and left heart cath. She was admitted there with progressive dyspnea on exertion, orthopnea, PND and pedal edema. Echo with LVEF of 35%, LVH and severe aortic stenosis. She was seen by Dr. Jens Som and transferred her. Cath with normal coronary arteries yesterday. Elevated pressures c/w fluid overload. The gradient across her AV by cath is moderate.   1. Non-ischemic CM: s/p cath yesterday with normal coronary arteries. Still volume overloaded. Will continue diuresis with IV Lasix and current medical therapy with beta blocker, ARB and aldactone. Check BMET today as ordered yesterday but not done yet.   2. Acute on chronic systolic heart failure: See above. Non-ischemic.   3. Aortic valve stenosis: Moderate by cath. I do not think consideration for AVR is needed at this time. Will d/w Dr. Jens Som.   4.  Hepatitis C  5. DM:  Continue current therapy. She has an insulin pump.  6. HTN:  BP well controlled.   7. COPD  8. RBBB     Kathryn Morgan  12/28/20129:12 AM

## 2011-06-04 NOTE — Progress Notes (Signed)
CSW met with patient at bedside. CSW reviewed all CHF management guidelines with the patient. The pt was very cooperative and asked many questions about diet and CHF management guidelines. Pt had interest in dietary restrictions so CSW requested an RD consult.  Pt currently lives at home with her husband and reports having a good support.  Pt did have interest in home health RN, PT and OT. [facility] chose [facility] and these orders have been requested.  Pt reported no symptoms of depression at this time. CSW completed the psychosocial assessment which can be found in the shadow chart. Clinical Social Work will sign off for now as social work intervention is no longer needed. Please consult [place] again if new need arises.

## 2011-06-05 DIAGNOSIS — I5021 Acute systolic (congestive) heart failure: Principal | ICD-10-CM

## 2011-06-05 LAB — BASIC METABOLIC PANEL
CO2: 39 mEq/L — ABNORMAL HIGH (ref 19–32)
Calcium: 9.3 mg/dL (ref 8.4–10.5)
Creatinine, Ser: 0.9 mg/dL (ref 0.50–1.10)
GFR calc non Af Amer: 64 mL/min — ABNORMAL LOW (ref >90)
Glucose, Bld: 166 mg/dL — ABNORMAL HIGH (ref 70–99)

## 2011-06-05 LAB — GLUCOSE, CAPILLARY
Glucose-Capillary: 144 mg/dL — ABNORMAL HIGH (ref 70–99)
Glucose-Capillary: 115 mg/dL — ABNORMAL HIGH (ref 70–99)
Glucose-Capillary: 158 mg/dL — ABNORMAL HIGH (ref 70–99)
Glucose-Capillary: 255 mg/dL — ABNORMAL HIGH (ref 70–99)

## 2011-06-05 MED ORDER — POTASSIUM CHLORIDE 20 MEQ/15ML (10%) PO LIQD
40.0000 meq | Freq: Once | ORAL | Status: AC
  Administered 2011-06-05: 40 meq via ORAL
  Filled 2011-06-05: qty 30

## 2011-06-05 MED ORDER — FUROSEMIDE 40 MG PO TABS
40.0000 mg | ORAL_TABLET | Freq: Two times a day (BID) | ORAL | Status: DC
  Administered 2011-06-05 (×2): 40 mg via ORAL
  Filled 2011-06-05 (×5): qty 1

## 2011-06-05 MED ORDER — CARVEDILOL 6.25 MG PO TABS
9.3750 mg | ORAL_TABLET | Freq: Two times a day (BID) | ORAL | Status: DC
  Administered 2011-06-05 – 2011-06-06 (×3): 9.375 mg via ORAL
  Filled 2011-06-05 (×8): qty 1

## 2011-06-05 NOTE — Progress Notes (Signed)
Patient ID: Kathryn Morgan, female   DOB: [birth year], 70 y.o.   MRN: [id number]    SUBJECTIVE: Breathing much better, not short of breath going to bathroom.  She is down 25+ lbs.       Marland Kitchen antiseptic oral rinse  15 mL Mouth Rinse BID  . aspirin  81 mg Oral Daily  . carvedilol  9.375 mg Oral BID WC  . docusate sodium  100 mg Oral BID  . Fluticasone-Salmeterol  1 puff Inhalation Q12H  . furosemide  40 mg Oral BID  . heparin  5,000 Units Subcutaneous Q8H  . insulin pump   Subcutaneous TID AC, HS, 0200  . lactobacillus acidophilus & bulgar  1 tablet Oral TID WC  . losartan  100 mg Oral Daily  . omega-3 acid ethyl esters  2 g Oral BID  . pantoprazole  40 mg Oral BID AC  . potassium chloride  40 mEq Oral Once  . rosuvastatin  5 mg Oral q1800  . sodium chloride  3 mL Intravenous Q12H  . spironolactone  25 mg Oral Daily  . tiotropium  18 mcg Inhalation Daily  . DISCONTD: furosemide  80 mg Intravenous BID  . DISCONTD: metoprolol  50 mg Oral BID      Filed Vitals:   06/04/11 1300 06/04/11 1337 06/04/11 2057 06/05/11 0536  BP: 127/82 124/78 188/77 105/69  Pulse: 102 105 92 82  Temp:  97.9 F (36.6 C) 98.5 F (36.9 C) 97.8 F (36.6 C)  TempSrc:  Oral    Resp:  20  24  Height:      Weight:    89.9 kg (198 lb 3.1 oz)  SpO2:  96% 95% 95%    Intake/Output Summary (Last 24 hours) at 06/05/11 0809 Last data filed at 06/05/11 0543  Gross per 24 hour  Intake   1083 ml  Output   2725 ml  Net  -1642 ml    LABS: Basic Metabolic Panel:  Basename 06/05/11 0600 06/04/11 0935  NA 142 138  K 3.4* 4.3  CL 93* 89*  CO2 39* 37*  GLUCOSE 166* 277*  BUN 35* 28*  CREATININE 0.90 0.88  CALCIUM 9.3 9.5  MG -- --  PHOS -- --   Liver Function Tests: No results found for this basename: AST:2,ALT:2,ALKPHOS:2,BILITOT:2,PROT:2,ALBUMIN:2 in the last 72 hours No results found for this basename: LIPASE:2,AMYLASE:2 in the last 72 hours CBC:  Basename 06/04/11 0935 06/03/11 1217  WBC 8.9  8.7  NEUTROABS -- --  HGB 13.7 13.1  HCT 45.3 42.7  MCV 79.5 79.4  PLT 304 270   RADIOLOGY: Dg Chest Portable 1 View  05/30/2011  *RADIOLOGY REPORT*  Clinical Data: Short of breath  PORTABLE CHEST - 1 VIEW  Comparison: Chest radiograph 10/03/2009  Findings: Stable enlarged heart silhouette.  There is mild central venous pulmonary congestion.  Left base is poorly evaluated.  No pneumothorax.  IMPRESSION: Cardiomegaly and  central venous pulmonary congestion.  Left base is poorly evaluated.  Original Report Authenticated By: Genevive Bi, M.D.    PHYSICAL EXAM General: NAD Neck: No JVD, no thyromegaly or thyroid nodule.  Lungs: Slight crackles at bases.  CV: Nondisplaced PMI.  Heart regular S1/S2, no S3/S4, 3/6 mid-late peaking systolic murmur RUSB with some obscuring of S2.  No peripheral edema.   Abdomen: Soft, nontender, no hepatosplenomegaly, no distention.  Neurologic: Alert and oriented x 3.  Psych: Normal affect. Extremities: No clubbing or cyanosis.   ASSESSMENT AND PLAN: 70 yo presented  to [facility] with acute systolic CHF and was noted to have severe AS by echo.  She had left/right heart cath here with no significant CAD.  AS on cath appeared in the mild to moderate range.  1. AS: Definite discrepancy between cath and echo.  EF 35% on echo but mean gradient was 51 with peak 70 so not low flow/low gradient-type pseudostenosis picture (no role for dobutamine echo).  Discussed with Dr. Clifton James, valve was crossed easily.  On exam, certainly could be consistent with severe AS.  However, mean gradient by cath was only 15 mmHg.  Will need to review echo (could there be contamination from mid-cavity gradient or MR?).  She will need close outpatient followup.  2. CHF: Acute systolic CHF.  She has lost over 25 lbs since admission.  No edema, JVP appears to be near-normal.  BUN rising.  I will change her Lasix to po today.  Stop metoprolol and begin Coreg.  If she is stable and doing  well, possibly home tomorrow with close followup.  Has nonischemic CMP.  3. Diabetes: Insulin pump.   Marca Ancona 06/05/2011 8:15 AM

## 2011-06-05 NOTE — Plan of Care (Signed)
Problem: Food- and Nutrition-Related Knowledge Deficit (NB-1.1) Goal: Nutrition education Formal process to instruct or train a patient/client in a skill or to impart knowledge to help patients/clients voluntarily manage or modify food choices and eating behavior to maintain or improve health.  Outcome: Completed/Met Date Met:  06/05/11 Patient was eager to hear information about low sodium/heart healthy diet. Patient asked appropriate questions and verbalized understanding of key concepts. RD used the CHF booklet as well as provided handouts about low sodium and heart healthy diet from the [employer] of [employer]. Patient had no more questions at the end of the session. RD encourage patient to notify nurse if additional questions arise.   Nutrition goals met.

## 2011-06-05 NOTE — Progress Notes (Signed)
Pt ambulated in hallway-starting 02 sat on room air 94%. After ambulation sat dropped to 84% then recovered to 96% on 3 l [place]

## 2011-06-06 LAB — CBC
MCH: 23.9 pg — ABNORMAL LOW (ref 26.0–34.0)
Platelets: 239 10*3/uL (ref 150–400)
RBC: 5.15 MIL/uL — ABNORMAL HIGH (ref 3.87–5.11)
WBC: 8.1 10*3/uL (ref 4.0–10.5)

## 2011-06-06 LAB — GLUCOSE, CAPILLARY
Glucose-Capillary: 111 mg/dL — ABNORMAL HIGH (ref 70–99)
Glucose-Capillary: 270 mg/dL — ABNORMAL HIGH (ref 70–99)

## 2011-06-06 LAB — BASIC METABOLIC PANEL
Calcium: 9.2 mg/dL (ref 8.4–10.5)
GFR calc Af Amer: 69 mL/min — ABNORMAL LOW (ref >90)
GFR calc non Af Amer: 60 mL/min — ABNORMAL LOW (ref >90)
Potassium: 3.7 mEq/L (ref 3.5–5.1)
Sodium: 140 mEq/L (ref 135–145)

## 2011-06-06 MED ORDER — CARVEDILOL 12.5 MG PO TABS
12.5000 mg | ORAL_TABLET | Freq: Two times a day (BID) | ORAL | Status: DC
  Administered 2011-06-06 – 2011-06-07 (×3): 12.5 mg via ORAL
  Filled 2011-06-06 (×4): qty 1

## 2011-06-06 MED ORDER — FUROSEMIDE 10 MG/ML IJ SOLN
40.0000 mg | Freq: Two times a day (BID) | INTRAMUSCULAR | Status: DC
  Administered 2011-06-06 – 2011-06-07 (×3): 40 mg via INTRAVENOUS
  Filled 2011-06-06 (×4): qty 4

## 2011-06-06 MED ORDER — POTASSIUM CHLORIDE CRYS ER 20 MEQ PO TBCR
40.0000 meq | EXTENDED_RELEASE_TABLET | Freq: Every day | ORAL | Status: DC
  Administered 2011-06-06 – 2011-06-07 (×2): 40 meq via ORAL
  Filled 2011-06-06 (×2): qty 2

## 2011-06-06 NOTE — Progress Notes (Signed)
Pt ambulated in hallway and again dropped 02 sat to 84% room air. Returned to o2 2lnc with sat up to 96%

## 2011-06-06 NOTE — Progress Notes (Addendum)
Patient ID: Kathryn Morgan, female   DOB: Apr 16, 1941, 70 y.o.   MRN: [id number]     SUBJECTIVE: Stable, still with mild dyspnea walking in hall but improved.  28 lbs down from admission.       Marland Kitchen antiseptic oral rinse  15 mL Mouth Rinse BID  . aspirin  81 mg Oral Daily  . carvedilol  12.5 mg Oral BID WC  . docusate sodium  100 mg Oral BID  . Fluticasone-Salmeterol  1 puff Inhalation Q12H  . furosemide  40 mg Oral BID  . heparin  5,000 Units Subcutaneous Q8H  . insulin pump   Subcutaneous TID AC, HS, 0200  . lactobacillus acidophilus & bulgar  1 tablet Oral TID WC  . losartan  100 mg Oral Daily  . omega-3 acid ethyl esters  2 g Oral BID  . pantoprazole  40 mg Oral BID AC  . potassium chloride  40 mEq Oral Once  . potassium chloride  40 mEq Oral Daily  . rosuvastatin  5 mg Oral q1800  . sodium chloride  3 mL Intravenous Q12H  . spironolactone  25 mg Oral Daily  . tiotropium  18 mcg Inhalation Daily  . DISCONTD: carvedilol  9.375 mg Oral BID WC      Filed Vitals:   06/05/11 1300 06/05/11 2119 06/05/11 2131 06/06/11 0628  BP: 120/74  120/78 129/85  Pulse: 90  100 88  Temp: 98.2 F (36.8 C)  98.3 F (36.8 C) 97.3 F (36.3 C)  TempSrc: Oral  Oral Oral  Resp: 20  20 20   Height:      Weight:    89.7 kg (197 lb 12 oz)  SpO2: 95% 92% 95% 95%    Intake/Output Summary (Last 24 hours) at 06/06/11 0915 Last data filed at 06/06/11 8657  Gross per 24 hour  Intake    480 ml  Output   2275 ml  Net  -1795 ml    LABS: Basic Metabolic Panel:  Basename 06/06/11 0709 06/05/11 0600  NA 140 142  K 3.7 3.4*  CL 98 93*  CO2 38* 39*  GLUCOSE 101* 166*  BUN 36* 35*  CREATININE 0.95 0.90  CALCIUM 9.2 9.3  MG -- --  PHOS -- --   Liver Function Tests: No results found for this basename: AST:2,ALT:2,ALKPHOS:2,BILITOT:2,PROT:2,ALBUMIN:2 in the last 72 hours No results found for this basename: LIPASE:2,AMYLASE:2 in the last 72 hours CBC:  Basename 06/06/11 0709 06/04/11 0935    WBC 8.1 8.9  NEUTROABS -- --  HGB 12.3 13.7  HCT 40.4 45.3  MCV 78.4 79.5  PLT 239 304     PHYSICAL EXAM General: NAD Neck: No JVD, no thyromegaly or thyroid nodule.  Lungs: Slight crackles at bases.  CV: Nondisplaced PMI.  Heart regular S1/S2, no S3/S4, 3/6 mid-late peaking systolic murmur RUSB with some obscuring of S2.  No peripheral edema.   Abdomen: Soft, nontender, no hepatosplenomegaly, no distention.  Neurologic: Alert and oriented x 3.  Psych: Normal affect. Extremities: No clubbing or cyanosis.   ASSESSMENT AND PLAN: 70 yo presented to [facility] with acute systolic CHF and was noted to have severe AS by echo.  She had left/right heart cath here with no significant CAD.  AS on cath appeared in the mild to moderate range.  1. AS: Definite discrepancy between cath and echo.  EF 35% on echo but mean gradient was 51 with peak 70 so not low flow/low gradient-type pseudostenosis picture (no role for dobutamine  echo).  Discussed with Dr. Clifton James, valve was crossed easily.  On exam, certainly could be consistent with severe AS.  However, mean gradient by cath was only 15 mmHg.  Will need to review echo (could there be contamination from mid-cavity gradient or MR?).  She will need close outpatient followup, would consider TEE to evaluate valve more closely when she has recovered from the current hospitalization.  I am concerned that the valve will need replacement soon: AS is still the best explanation for her CHF and cardiomyopathy.  2. CHF: Acute systolic CHF.  She has lost over 25 lbs since admission.  No edema, JVP appears to be near-normal.  Nonischemic CMP.  Now on po Lasix.  Increase Coreg to 12.5 mg bid.  Still some dyspnea but can be gradually diuresed as an outpatient at this point.  3. Diabetes: Insulin pump.  4. Disposition: Home today.  Will need followup within 1 week at [facility] office with BMET.  Cardiac meds: ASA 81, Coreg 12.5 mg bid, losartan 100 mg daily,  spironolactone 25 mg daily, KCl 40 daily, Lasix 40 mg bid x 5 days then decrease to 40 mg daily, Spiriva, simvastatin 20.   Marca Ancona 06/06/2011 9:15 AM   ADDENDUM: Here to d/c patient.  RN notes she has noted some lightheadedness and O2 sats at rest on RA 88%. D/w Dr. Marca Ancona.  Will keep in house today. Put back on Lasix 40 mg IV bid. Check bmet in am. Keep on O2. Hopefull d/c home tomorrow. Discussed with Prudencio Burly and her husband. Tereso Newcomer, PA-C  10:00 AM 06/06/2011

## 2011-06-07 ENCOUNTER — Encounter (HOSPITAL_COMMUNITY): Payer: Self-pay | Admitting: Physician Assistant

## 2011-06-07 DIAGNOSIS — I359 Nonrheumatic aortic valve disorder, unspecified: Secondary | ICD-10-CM

## 2011-06-07 DIAGNOSIS — I509 Heart failure, unspecified: Secondary | ICD-10-CM

## 2011-06-07 LAB — GLUCOSE, CAPILLARY: Glucose-Capillary: 139 mg/dL — ABNORMAL HIGH (ref 70–99)

## 2011-06-07 LAB — BASIC METABOLIC PANEL
CO2: 38 mEq/L — ABNORMAL HIGH (ref 19–32)
Chloride: 98 mEq/L (ref 96–112)
GFR calc Af Amer: 58 mL/min — ABNORMAL LOW (ref >90)
Potassium: 3.9 mEq/L (ref 3.5–5.1)
Sodium: 143 mEq/L (ref 135–145)

## 2011-06-07 MED ORDER — CARVEDILOL 12.5 MG PO TABS: 12.5000 mg | ORAL_TABLET | Freq: Two times a day (BID) | ORAL | Status: DC

## 2011-06-07 MED ORDER — SPIRONOLACTONE 25 MG PO TABS: 25.0000 mg | ORAL_TABLET | Freq: Every day | ORAL | Status: DC

## 2011-06-07 MED ORDER — POTASSIUM CHLORIDE CRYS ER 20 MEQ PO TBCR: 40.0000 meq | EXTENDED_RELEASE_TABLET | Freq: Every day | ORAL | Status: DC

## 2011-06-07 MED ORDER — LOSARTAN POTASSIUM 100 MG PO TABS: 100.0000 mg | ORAL_TABLET | Freq: Every day | ORAL | Status: DC

## 2011-06-07 MED ORDER — FUROSEMIDE 20 MG PO TABS: ORAL_TABLET | ORAL | Status: DC

## 2011-06-07 MED ORDER — ASPIRIN 81 MG PO TABS: 81.0000 mg | ORAL_TABLET | Freq: Every day | ORAL | Status: DC

## 2011-06-07 MED ORDER — ALPRAZOLAM 1 MG PO TABS: 0.5000 mg | ORAL_TABLET | Freq: Four times a day (QID) | ORAL | Status: DC | PRN

## 2011-06-07 NOTE — Progress Notes (Addendum)
Cardiology Progress Note Patient Name: Kathryn Morgan Date of Encounter: 06/07/2011, 1:47 PM     Subjective  No overnight events. Patient denies chest pain, palpitations, or shortness of breath. She has dyspnea on exertion and continued to have low O2 sats with ambulation without supplemental oxygen. She feels much better overall and would like to go home today.   Objective   Telemetry: Sinus rhythm, 80s-90s  Medications: . antiseptic oral rinse  15 mL Mouth Rinse BID  . aspirin  81 mg Oral Daily  . carvedilol  12.5 mg Oral BID WC  . docusate sodium  100 mg Oral BID  . Fluticasone-Salmeterol  1 puff Inhalation Q12H  . furosemide  40 mg Intravenous BID  . heparin  5,000 Units Subcutaneous Q8H  . insulin pump   Subcutaneous TID AC, HS, 0200  . lactobacillus acidophilus & bulgar  1 tablet Oral TID WC  . losartan  100 mg Oral Daily  . omega-3 acid ethyl esters  2 g Oral BID  . pantoprazole  40 mg Oral BID AC  . potassium chloride  40 mEq Oral Daily  . rosuvastatin  5 mg Oral q1800  . sodium chloride  3 mL Intravenous Q12H  . spironolactone  25 mg Oral Daily  . tiotropium  18 mcg Inhalation Daily    Physical Exam: Temp:  [97.3 F (36.3 C)-97.7 F (36.5 C)] 97.3 F (36.3 C) (12/31 0558) Pulse Rate:  [89-92] 89  (12/31 0558) Resp:  [18] 18  (12/31 0558) BP: (130-133)/(71-80) 130/80 mmHg (12/31 0558) SpO2:  [94 %-96 %] 96 % (12/31 0558) Weight:  [89.676 kg (197 lb 11.2 oz)] 197 lb 11.2 oz (89.676 kg) (12/31 0558)  General: Well developed, well nourished, overweight elderly white female, in no acute distress. Head: Normocephalic, atraumatic, sclera non-icteric, nares are without discharge.  Neck: Supple. Negative for carotid bruits or JVD Lungs: Bibasilar rales. No wheezes or rhonchi. Breathing is unlabored. Heart: RRR S1 S2. 3/6 mid-late peaking systolic murmur RUSB with some obscuring of S2  no rubs, or gallops.  Abdomen: Soft, non-tender, non-distended with  normoactive bowel sounds. No rebound/guarding. No obvious abdominal masses. Msk:  Strength and tone appear normal for age. Extremities: Trace bilat LE edema. No clubbing or cyanosis. Distal pedal pulses are 2+ and equal bilaterally. Neuro: Alert and oriented X 3. Moves all extremities spontaneously. Psych:  Responds to questions appropriately with a normal affect.  Intake/Output Summary (Last 24 hours) at 06/07/11 1347 Last data filed at 06/07/11 1319  Gross per 24 hour  Intake    444 ml  Output   2125 ml  Net  -1681 ml   Labs:  [facility] 06/07/11 0600 06/06/11 0709  NA 143 140  K 3.9 3.7  CL 98 98  CO2 38* 38*  GLUCOSE 124* 101*  BUN 42* 36*  CREATININE 1.10 0.95  CALCIUM 9.5 9.2   Basename 06/06/11 0709  WBC 8.1  HGB 12.3  HCT 40.4  MCV 78.4  PLT 239     05/30/2011 13:35 06/04/2011 16:10  Pro B Natriuretic peptide (BNP) 8423.0 (H) 10389.0 (H)     05/30/2011 13:35  CK, MB 3.5  CK Total 55  Troponin I <0.30    05/30/2011 13:35  Hemoglobin A1C 8.2 (H)     05/30/2011 13:35  TSH 0.083 (L)     06/01/2011 04:55  Free T4 0.97  T3, Free 2.2 (L)    Radiology/Studies:  Dg Chest Portable 1 View  05/30/2011   Findings: Stable enlarged heart silhouette.  There is mild central venous pulmonary congestion.  Left base is poorly evaluated.  No pneumothorax.  IMPRESSION: Cardiomegaly and  central venous pulmonary congestion.  Left base is poorly evaluated.  Original Report Authenticated By: Genevive Bi, M.D.   05/31/11 - 2D Echocardiogram - Left ventricle: LVEF is approximately 35% with hypokinesis of the inferoseptal , lateral, anterior and inferior wall (severe inferiorly) The cavity size was normal. Wall thickness was increased in a pattern of moderate LVH.  - Aortic valve: AV is thickened, calcified with severely restricted motion. Peak and mean gradients through the valve are 70 and 51 mm Hg respectively. - Pulmonary arteries: PA peak pressure: 65mm Hg (S).  06/03/11  - Left Heart Catheterization, Selective Coronary Angiography, Right Heart Catheterization Hemodynamic Findings:  Ao: 153/90 LV: 175/23/28  RA: 13 RV: 65/11/16  PA: 64/33 (mean 44) PCWP: 30  Fick Cardiac Output: 4.64 L/min  Fick Cardiac Index: 2.29 L/min/m2  Central Aortic Saturation: 97%  Pulmonary Artery Saturation: 57%  Aortic Valve: Peak to peak gradient  Mean gradient 15 mmHg  AVA 1.2 cm2  AVA index 0.55  Angiographic Findings:  Left main: No evidence of disease.  Left Anterior Descending Artery: Large vessel that wraps around the apex. No disease. Moderate sized diagonal branch.  Circumflex Artery: Moderate sized vessel with moderate sized OM1. No disease.  Right Coronary Artery: Moderate sized dominant vessel. No disease.  Left Ventricular Angiogram: Not performed.  Impression: 1. No angiographic evidence of CAD   2. Moderate aortic valve stenosis based on cath findings.  3. CHF with volume overload  Recommendations: I do not think her aortic valve disease is severe enough to warrant AVR at this time. She will be diuresed aggressively. Medical management of non-ischemic cardiomyopathy.     Assessment and Plan  [phone] w/ PMHx significant for DM, HTN, and HLD who presented to [facility] with acute systolic CHF and was noted to have severe AS by echo. She was transferred to [facility] and underwent left/right heart cath with no significant CAD and mild to moderate AS. She has improved with diuresis, with some mild dyspnea on exertion and still requiring oxygen.  1. Aortic Stenosis: Definite discrepancy between cath and echo. EF 35% on echo but mean gradient was 51 with peak 70 so not low flow/low gradient-type pseudostenosis picture (no role for dobutamine echo). Discussed with Dr. Clifton James, valve was crossed easily. On exam, certainly could be consistent with severe AS. However, mean gradient by cath was only 15 mmHg. Will need to review echo (could there be contamination from  mid-cavity gradient or MR?). She will need close outpatient followup, would consider TEE to evaluate valve more closely when she has recovered from the current hospitalization. There is concern that the valve will need replacement soon: AS is still the best explanation for her CHF and cardiomyopathy.   2. CHF: Acute systolic CHF. She has lost over 25 lbs since admission. Trace edema, JVP appears to be near-normal. Nonischemic CMP. On losartan, lasix, Coreg, and spirinolactone. Still some dyspnea but can be gradually diuresed as an outpatient at this point. She is still requiring oxygen with ambulation. Case management has set up home health and home oxygen.   3. Diabetes: Insulin pump.     Signed, HOPE, JESSICA PA-C  WE will discharge today  1) change lasix to po 2) f/u w SM in [place] next week --needs BMP;I have spoken with him  O/w  will need appt with SW 3) Outpt pulm eval--for OSA and COPD Ok for  discahrge

## 2011-06-07 NOTE — Progress Notes (Signed)
06/07/11 Nursing 1907pm DC IV, DC Tele, DC Home. Discharge instructions and home medications discussed with patient and patient's family. Patient denies any questions or concerns at this time. Patient leaving unit via wheelchair and appears in no acute distress. Maurilio Lovely M,RN

## 2011-06-07 NOTE — Progress Notes (Deleted)
06/07/11 Nursing 1540 Patient was 89% on Room air. Ambulated patient in hallway without oxygen and her oxygen level decreased to 86%. Oxygen re-applied while ambulating and O2 Sats increased to 95%. Ernesta Amble, RN

## 2011-06-07 NOTE — Progress Notes (Signed)
   CARE MANAGEMENT NOTE HEART FAILURE  06/07/2011   Patient:  Kathryn Morgan, Kathryn Morgan   Account Number:  [id number]    Date Initiated:  06/07/2011  Documentation initiated by:  Shannan Harper  Subjective/Objective Assessment:   Patient admitted with hx of CHF in an exacerbation.   Action/Plan:   Discharge to home with [facility] services as noted below.   Anticipated DC Date:  06/07/2011  Anticipated DC Plan:  HOME W HOME HEALTH SERVICES  In-house referral:  Clinical Social Worker    DC Planning Services:  CM consult    PAC Choice:  HOME HEALTH  DURABLE MEDICAL EQUIPMENT   Choice offered to / List presented to:  C-1 Patient DME arranged:  OXYGEN     DME agency:  [facility]   HH arranged:  HH-1 RN  HH-10 DISEASE MANAGEMENT  HH-2 PT  HH-3 OT     HH agency:  [facility]    Status of service:  Completed, signed off  Medicare Important Message Given:   (If response is "NO", the following Medicare IM given date fields will be blank) Date Medicare IM Given:   Date Additional Medicare IM Given:    Discharge Disposition:  HOME W HOME HEALTH SERVICES  Per UR Regulation:  Reviewed for med. necessity/level of care/duration of stay  Comments:   UR Completed by Blanche East, initial. UR Concurrent Review Completed 06/07/11 1142 Shannan Harper, RN, BSN  Amy Godfrey, [place] following patient for discharge planning. She spoke with patient on Friday and set up [facility] services per note in EPIC with [facility].  Notified Hilda Lias with [facility] of patient's discharge.  Will continue to assist as needed.  06/07/11 1143 Shannan Harper, RN, BSN  Received a call from assigned RN and new order for oxygen therapy at home.  Will go with [facility] per patient's request. Placed referral with Darian with [facility] and he will deliver oxygen today.  Will continue to assist as needed.  06/07/11 1359 Shannan Harper, RN, BSN   Initial CM contact:     By:   Initial CSW contact:  06/04/2011 12:00 M  By:   Sabino Niemann    Is this an INP Readmission < 30 days:  N (If "YES" please see readmission information at the bottom of note)  Patient living status prior to this admission:  FAMILY  Patient setting prior to this admission:  HOME  Comorbid conditions being treated that contributed to this admission:  HIGH CHF, DM, Resp Fx  CHF Readmission Risk:  high  Type of patient education provided  HF Patient Education Assessment / Teach Back  HF Zone Tool / Magnet  Limit salt intake  Weigh daily     Patient education provided by  [facility]    Was referral made to [facility]:  N  Is the patient's PCP the same as attending:  N PCP:    Readmission < 30 Days If pt has HH, did they contact the agency before going to the ED:   Name of [facility] agency:    Was the follow-up physician visit scheduled prior to discharge:    Did the patient follow-up with the physician prior to this readmission:    Was there HF Clinic visits prior to readmission:    Were there ED visits between admissions:    Readmit type:    If unscheduled and related indicate reason for readmit:

## 2011-06-07 NOTE — Progress Notes (Signed)
   CARE MANAGEMENT NOTE HEART FAILURE  06/07/2011   Patient:  Kathryn Morgan, Kathryn Morgan   Account Number:  [id number]    Date Initiated:  06/07/2011  Documentation initiated by:  Shannan Harper  Subjective/Objective Assessment:   Patient admitted with hx of CHF in an exacerbation.   Action/Plan:   Discharge to home with [facility] services as noted below.   Anticipated DC Date:  06/07/2011  Anticipated DC Plan:  HOME W HOME HEALTH SERVICES  In-house referral:  Clinical Social Worker    DC Planning Services:  CM consult    PAC Choice:  HOME HEALTH   Choice offered to / List presented to:  C-1 Patient    HH arranged:  HH-1 RN  HH-10 DISEASE MANAGEMENT  HH-2 PT  HH-3 OT     HH agency:  [facility]    Status of service:  Completed, signed off  Medicare Important Message Given:   (If response is "NO", the following Medicare IM given date fields will be blank) Date Medicare IM Given:   Date Additional Medicare IM Given:    Discharge Disposition:  HOME W HOME HEALTH SERVICES  Per UR Regulation:  Reviewed for med. necessity/level of care/duration of stay  Comments:   UR Completed by Blanche East, initial. UR Concurrent Review Completed 06/07/11 1142 Shannan Harper, RN, BSN  Amy Ruth, [place] following patient for discharge planning. She spoke with patient on Friday and set up [facility] services per note in EPIC with [facility].  Notified Hilda Lias with [facility] of patient's discharge.  Will continue to assist as needed.  06/07/11 1143 Shannan Harper, RN, BSN   Initial CM contact:     By:   Initial CSW contact:  06/04/2011 12:00 M  By:  Sabino Niemann    Is this an INP Readmission < 30 days:  N (If "YES" please see readmission information at the bottom of note)  Patient living status prior to this admission:  FAMILY  Patient setting prior to this admission:  HOME  Comorbid conditions being treated that contributed to this admission:  HIGH CHF, DM, Resp Fx  CHF Readmission Risk:  high  Type  of patient education provided  HF Patient Education Assessment / Teach Back  HF Zone Tool / Magnet  Limit salt intake  Weigh daily     Patient education provided by  [facility]    Was referral made to [facility]:  N  Is the patient's PCP the same as attending:  N PCP:    Readmission < 30 Days If pt has HH, did they contact the agency before going to the ED:   Name of [facility] agency:    Was the follow-up physician visit scheduled prior to discharge:    Did the patient follow-up with the physician prior to this readmission:    Was there HF Clinic visits prior to readmission:    Were there ED visits between admissions:    Readmit type:    If unscheduled and related indicate reason for readmit:

## 2011-06-07 NOTE — Progress Notes (Signed)
06/07/11 Nursing 1540 Patient was 89% on Room air. Ambulated patient in hallway without oxygen and her oxygen level decreased to 86%. 2L of Oxygen re-applied while ambulating and O2 Sats increased to 95%. Ernesta Amble, RN

## 2011-06-07 NOTE — Progress Notes (Signed)
06/07/11 Nursing 1330 Patient O2 sats was 95% before ambulating without oxygen. During ambulation in hallway patient oxygen level decrease to 83%. Patient sat down and oxygen was place back on her and oxygen level increase. Ernesta Amble, RN

## 2011-06-07 NOTE — Discharge Summary (Signed)
Discharge Summary   Patient ID: GYANNA JAREMA MRN: [id number], DOB/AGE: 08-Jan-1941 70 y.o. Admit date: 05/30/2011 D/C date:     06/07/2011   Primary Discharge Diagnoses:  1. New onset acute systolic CHF secondary to non-ischemic cardiomyopathy - EF 35% by echo 05/31/11 - no evidence of CAD by cath 06/03/11 2. Aortic stenosis - discrepancy between cath & echo - Dr. Shirlee Latch feels very close outpatient follow-up is needed with consideration for possible TEE  - severe by echo 05/31/11 but moderate by cath 06/03/11  3. Respiratory failure with hypoxia documented this admission, requiring home O2 4. Abnormal thyroid function tests - Decreased TSH on admission with normal free T4 but decreased free T3 - for outpatient follow-up 5. DM 6. HTN 7. Dyslipidemia 8. RBBB  Secondary Discharge Diagnoses:  1. Hepatitis C 2. Depression 3. Necrotizing pancreatitis 4. Gallstone pancreatitis 2005 5. Neuropathy 6. Hx of rhabdomyolysis  Hospital Course: 70 y/o F with hx DM, HTN presented with worsening SOB over the past 2 months, severe bilateral LEE resistant to Lasix titration as OP. In ER, CXR showed vascular congestion with elevated proBNP of over 8000. Echo on 12/24 showed an EF of 35% with mod LVH and severe AS. Cardiology was asked to consult and recommended transfer to [facility] with cardiac catheterization. Cath 06/03/11 showed moderate AS that Dr. Clifton James felt was not severe enough to warrant AVR. Dr. Shirlee Latch reviewed these findings and felt that she will need very close outpatient followup. He stated he would consider TEE to evaluate valve more closely when she has recovered from the current hospitalization. He am concerned that the valve will need replacement soon since AS is still the best explanation for her CHF and cardiomyopathy. Aggressive diuresis and med mgmt of her NICM was continued. Meds were adjusted and her regimen was titrated. She was noted to have hypoxia with  ambulation which precipitated need for home O2. She was felt stable enough for discharge yesterday, but required additional IV Lasix secondary to hypoxia/SOB/lightheadedness. Today the patient is feeling better - Dr. Graciela Husbands has seen and examined her today and feels she is stable for discharge. He feels she would benefit from outpatient pulmonary evaluation. Please note that Dr. Jens Som noted that her TSH was abnormal this admission raising suspicion for euthyroid sick syndrome- f/u free hormones are noted above and the patient will be instructed to f/u with PCP.   Discharge Vitals: Blood pressure 122/58, pulse 98, temperature 98 F (36.7 C), temperature source Oral, resp. rate 18, height 5\' 5"  (1.651 m), weight 197 lb 11.2 oz (89.676 kg), SpO2 98.00%.  Labs: Lab Results  Component Value Date   WBC 8.1 06/06/2011   HGB 12.3 06/06/2011   HCT 40.4 06/06/2011   MCV 78.4 06/06/2011   PLT 239 06/06/2011    Lab 06/07/11 0600  NA 143  K 3.9  CL 98  CO2 38*  BUN 42*  CREATININE 1.10  CALCIUM 9.5  PROT --  BILITOT --  ALKPHOS --  ALT --  AST --  GLUCOSE 124*    Diagnostic Studies/Procedures   1. Chest Portable 1 View 05/30/2011  *RADIOLOGY REPORT*  Clinical Data: Short of breath  PORTABLE CHEST - 1 VIEW  Comparison: Chest radiograph 10/03/2009  Findings: Stable enlarged heart silhouette.  There is mild central venous pulmonary congestion.  Left base is poorly evaluated.  No pneumothorax.  IMPRESSION: Cardiomegaly and  central venous pulmonary congestion.  Left base is poorly evaluated.  Original Report Authenticated By:  Genevive Bi, M.D.  2. Cardiac catheterization this admission, please see full report and above for summary. 3. 2D echo 05/31/11 - Left ventricle: LVEF is approximately 35% with hypokinesis of the inferoseptal , lateral, anterior and inferior wall (severe inferiorly) The cavity size was normal. Wall thickness was increased in a pattern of moderate LVH. - Aortic valve: AV  is thickened, calcified with severely restricted motion. Peak and mean gradients through the valve are 70 and 51 mm Hg respectively. - Pulmonary arteries: PA peak pressure: 65mm Hg (S).   Discharge Medications   Current Discharge Medication List    START taking these medications   Details  carvedilol (COREG) 12.5 MG tablet Take 1 tablet (12.5 mg total) by mouth 2 (two) times daily with a meal. Qty: 60 tablet, Refills: 6    potassium chloride SA (K-DUR,KLOR-CON) 20 MEQ tablet Take 2 tablets (40 mEq total) by mouth daily. Qty: 60 tablet, Refills: 6    spironolactone (ALDACTONE) 25 MG tablet Take 1 tablet (25 mg total) by mouth daily. Qty: 30 tablet, Refills: 6      CONTINUE these medications which have CHANGED   Details  ALPRAZolam (XANAX) 1 MG tablet Take 0.5 tablets (0.5 mg total) by mouth 4 (four) times daily as needed. For anxiety. Your lorazepam medicine was stopped because it is a duplicate medicine in the same class, and with your oxygen dropping, it is not a good idea to decrease your respiratory drive by combining these medicines. This medicine's dose (alprazolam) was decreased.    aspirin 81 MG tablet Take 1 tablet (81 mg total) by mouth daily.    furosemide (LASIX) 20 MG tablet Take 2 tablets twice a day for 5 days, then decrease to 2 tablets daily. Qty: 60 tablet, Refills: 6    losartan (COZAAR) 100 MG tablet Take 1 tablet (100 mg total) by mouth daily. Qty: 30 tablet, Refills: 6      CONTINUE these medications which have NOT CHANGED   Details  docusate sodium (COLACE) 100 MG capsule Take 100 mg by mouth 2 (two) times daily.      fish oil-omega-3 fatty acids 1000 MG capsule Take 2 g by mouth daily.      Fluticasone-Salmeterol (ADVAIR DISKUS) 250-50 MCG/DOSE AEPB Inhale 1 puff into the lungs every 12 (twelve) hours.      lactobacillus acidophilus & bulgar (LACTINEX) chewable tablet Chew 1 tablet by mouth 3 (three) times daily with meals.      pantoprazole (PROTONIX)  40 MG tablet Take 1 tablet (40 mg total) by mouth daily. Take 30 minutes before breakfast Qty: 30 tablet, Refills: 1    simvastatin (ZOCOR) 20 MG tablet Take 20 mg by mouth at bedtime.      tiotropium (SPIRIVA) 18 MCG inhalation capsule Place 18 mcg into inhaler and inhale daily.      insulin regular (NOVOLIN R,HUMULIN R) 100 units/mL injection Inject into the skin. Insulin pump 2.5 units every hours       STOP taking these medications     diltiazem (DILACOR XR) 240 MG 24 hr capsule      LORazepam (ATIVAN) 1 MG tablet      metFORMIN (GLUMETZA) 500 MG (MOD) 24 hr tablet      metoprolol (LOPRESSOR) 50 MG tablet      roflumilast (DALIRESP) 500 MCG TABS tablet         Disposition   The patient will be discharged in stable condition to home. Discharge Orders    Future Appointments:  Provider: Department: Dept Phone: Center:   06/14/2011 2:20 PM Joni Reining, NP [facility] [phone] [id number]     Future Orders Please Complete By Expires   Diet - low sodium heart healthy      Increase activity slowly      Scheduling Instructions:   Make sure to weigh yourself daily and follow the directions below about contacting our office if your weight changes.     Follow-up Information    Follow up with [facility]. Make an appointment in 2 weeks. (Please contact Primary Doctor at discharge to help arrange pulmonary (lung) evaluation. You also need a recheck of thyroid tests - they were somewhat abnormal. This is occasionally seen when people are acutely ill but needs to be rechecked)       Follow up with Nona Dell, MD on 06/14/2011. (Labwork (BMET) at 2:00 and appointment at 2:20)    Contact information:   [address] [place]. [place] [address] [phone]            Duration of Discharge Encounter: Greater than 30 minutes including physician and PA time.  Signed, Ronie Spies PA-C 06/07/2011, 5:15 PM

## 2011-06-08 ENCOUNTER — Telehealth: Payer: Self-pay | Admitting: Nurse Practitioner

## 2011-06-08 NOTE — Telephone Encounter (Signed)
Pt called stating that after taking her am doses of coreg (12.5), cozaar (100) and lasix (20), her bp was 68/48.  Now, she is due for her pm doses of coreg and lasix and her bp is 88/50.  She's asked if she can hold her pm doses.  i advised that this is appropriate and that tomorrow am, she should reduce her cozaar to 1/2 a tab (50mg ).  I further advised that if bp is low 90 in the am, or < 90 following taking her am meds, that she should call back into the office as we may need to 1/2 her coreg as well.  She verbalized understanding.

## 2011-06-08 NOTE — Discharge Summary (Signed)
Heart failure and aortic stenosis F/u arramged

## 2011-06-09 ENCOUNTER — Telehealth: Payer: Self-pay | Admitting: *Deleted

## 2011-06-09 ENCOUNTER — Telehealth: Payer: Self-pay | Admitting: Internal Medicine

## 2011-06-09 NOTE — Telephone Encounter (Signed)
This is a [place] patient. Has a follow up scheduled with Joni Reining 06/14/11 for Dr. Diona Browner in the [place] office. I will forward to Reather Laurence, RN and Elease Hashimoto Via, LPN to follow up with the patient.

## 2011-06-09 NOTE — Telephone Encounter (Signed)
New problem,;  Per [facility], C/O blood pressure 78/45, advise on medication changes. Look at office notes on 06/08/2011 from PA - Ward Givens.

## 2011-06-09 NOTE — Telephone Encounter (Signed)
Spoke with patient regarding blood pressure and symptoms.  States she is asymptomatic at this time and had held cozaar last night and took 1/2 a tablet this am per conversation with Ward Givens last evening.  Took BP while on phone with myself and was 101/54 with heart rate of 95.  Will reschedule appointment for her to see Samara Deist tomorrow rather than Monday.  Verbalized understanding.

## 2011-06-10 ENCOUNTER — Ambulatory Visit (INDEPENDENT_AMBULATORY_CARE_PROVIDER_SITE_OTHER): Payer: 59 | Admitting: Adult Health

## 2011-06-10 ENCOUNTER — Encounter: Payer: Self-pay | Admitting: Adult Health

## 2011-06-10 VITALS — BP 153/91 | HR 96 | Ht 64.0 in | Wt 198.0 lb

## 2011-06-10 DIAGNOSIS — I5022 Chronic systolic (congestive) heart failure: Secondary | ICD-10-CM

## 2011-06-10 DIAGNOSIS — I5021 Acute systolic (congestive) heart failure: Secondary | ICD-10-CM

## 2011-06-10 DIAGNOSIS — I509 Heart failure, unspecified: Secondary | ICD-10-CM

## 2011-06-10 MED ORDER — POTASSIUM CHLORIDE CRYS ER 20 MEQ PO TBCR: 20.0000 meq | EXTENDED_RELEASE_TABLET | Freq: Every day | ORAL | Status: DC

## 2011-06-10 MED ORDER — FUROSEMIDE 40 MG PO TABS: 40.0000 mg | ORAL_TABLET | Freq: Every day | ORAL | Status: DC

## 2011-06-10 NOTE — Progress Notes (Signed)
HPI: Kathryn Morgan is a 71 y/o patient of Dr. Shirlee Latch that was recently seen at [facility] for new onset systolic CHF, secondary to nonishemic cardiomyopathy. Echo showed EF of 25%, and no evidence of CAD per cardiac cath 06/03/2011.  She also has a history of moderate AoV stenosis. She was discharged on 06/07/11. She called our service on 06/08/11 for complaints of hypotension. BP as low as 60's systolic per phone note by Brion Aliment, NP, She was told to stop taking cozaar the next day and take half thereafter. She again had episode of hypotension, ate some pickles and felt better. She was called to come in today for follow-up, earlier than scheduled appointment.  She feels better today, but has not taken her medications yet. BP is elevated in the office per triage, but recheck manually shows BP of 124/82. She is no longer dizzy or weak as previously expressed.  Allergies  Allergen Reactions  . Ace Inhibitors Cough  . Codeine Other (See Comments)    Does not know  . Phenergan     Current Outpatient Prescriptions  Medication Sig Dispense Refill  . ALPRAZolam (XANAX) 1 MG tablet Take 0.5 tablets (0.5 mg total) by mouth 4 (four) times daily as needed. For anxiety. Your lorazepam medicine was stopped because it is a duplicate medicine in the same class, and with your oxygen dropping, it is not a good idea to decrease your respiratory drive by combining these medicines. This medicine's dose (alprazolam) was decreased.      Marland Kitchen aspirin 81 MG tablet Take 1 tablet (81 mg total) by mouth daily.      . carvedilol (COREG) 12.5 MG tablet Take 1 tablet (12.5 mg total) by mouth 2 (two) times daily with a meal.  60 tablet  6  . docusate sodium (COLACE) 100 MG capsule Take 100 mg by mouth 2 (two) times daily.        . fish oil-omega-3 fatty acids 1000 MG capsule Take 2 g by mouth daily.        . Fluticasone-Salmeterol (ADVAIR DISKUS) 250-50 MCG/DOSE AEPB Inhale 1 puff into the lungs every 12 (twelve) hours.        .  insulin regular (NOVOLIN R,HUMULIN R) 100 units/mL injection Inject into the skin. Insulin pump 2.5 units every hours       . lactobacillus acidophilus & bulgar (LACTINEX) chewable tablet Chew 1 tablet by mouth 3 (three) times daily with meals.        . pantoprazole (PROTONIX) 40 MG tablet Take 1 tablet (40 mg total) by mouth daily. Take 30 minutes before breakfast  30 tablet  1  . potassium chloride SA (K-DUR,KLOR-CON) 20 MEQ tablet Take 2 tablets (40 mEq total) by mouth daily.  60 tablet  6  . simvastatin (ZOCOR) 20 MG tablet Take 20 mg by mouth at bedtime.        Marland Kitchen spironolactone (ALDACTONE) 25 MG tablet Take 1 tablet (25 mg total) by mouth daily.  30 tablet  6  . tiotropium (SPIRIVA) 18 MCG inhalation capsule Place 18 mcg into inhaler and inhale daily.          Past Medical History  Diagnosis Date  . DM (diabetes mellitus)     Insulin pump  . HTN (hypertension)   . Dyslipidemia   . Hepatitis C     per pt, hx of  . Depression   . Necrotizing pancreatitis   . Neuropathy   . Rhabdomyolysis   . Gallstone pancreatitis  2005  . Systolic CHF     New onset acute systolic CHF secondary to non-ischemic cardiomyopathy 05/2011 with no evidence for CAD by cath 06/03/11 - EF 35% by echo 05/31/11   . Aortic stenosis     Discrepancy between cath & echo - very close follow-up is needed for possible AVR. Severe by echo 05/31/11 but mod by cath 06/03/11  . Respiratory failure     Home O2 ordered 05/2011  . Abnormal thyroid blood test     05/2011 - decreased TSH on admission with normal free T4 but decreased free T3  . Right bundle-branch block     Past Surgical History  Procedure Date  . Esophagogastroduodenoscopy 04/21/04    normal esophagus/normal D1,D2; attempted  transgastric drainage/stent placement of pancreatic pseudocyst, no obvious location for gastrotomy, therefore transferred to [facility]  . Debridement pancreas   . Pancreatic pseudocyst drainage   . Cholecystectomy   .  Mastectomy     ZOX:WRUEAV of systems complete and found to be negative unless listed above PHYSICAL EXAM BP 153/91  Pulse 96  Ht 5\' 4"  (1.626 m)  Wt 198 lb (89.812 kg)  BMI 33.99 kg/m2  General: Well developed, well nourished, in no acute distress, wearing O2 per [place]. Head: Eyes PERRLA, No xanthomas.   Normal cephalic and atramatic  Lungs: Clear bilaterally to auscultation and percussion. Heart: HRRR S1 S2, 1/6 systolic murmur.  Pulses are 2+ & equal.            No carotid bruit. No JVD.  No abdominal bruits. No femoral bruits. Abdomen: Bowel sounds are positive, abdomen soft and non-tender without masses or                  Hernia's noted. Msk:  Back normal, normal gait. Normal strength and tone for age. Extremities: No clubbing, cyanosis or edema.  DP +1 Neuro: Alert and oriented X 3. Psych:  Good affect, responds appropriately  EKG: NSR with RBBB, ST segment depression laterally.  ASSESSMENT AND PLAN

## 2011-06-10 NOTE — Assessment & Plan Note (Signed)
There is no evidence of systolic heart failure on this assessment today. She was likely dehydrated with lasix along with spironolactone, causing hypotension. I will decrease lasix to 40 mg daily, decrease potassium to once a day 20 mEq, hold cozaar.  I will have BMET checked for evaluation of renal function and potassium status. Will most likely need to take her off the potassium altogether. I have explained to her the low EF and need to keep BP low normal, however if the BP drops to below 90 systolic she is to call us. I will see her in 3-4 days for quick follow-up to check her status. In the meantime, she is to continue to avoid salt, weigh herself daily and record. She is to take BP daily and record as well. I will review these recordings on follow-up appointment.

## 2011-06-10 NOTE — Patient Instructions (Addendum)
Your physician recommends that you schedule a follow-up appointment in: Monday 06/14/2011  Your physician recommends that you return for lab work in: Today  Your physician has recommended you make the following change in your medication:  1 - STOP Cozaar (Losartan) 2 - CHANGE Lasix to 40 mg daily 3 - DECREASE Potassium to 20 meq daily   Continue daily weights  Your physician has requested that you regularly monitor and record your blood pressure readings at home. Please use the same machine at the same time of day to check your readings and record them to bring to your follow-up visit.

## 2011-06-10 NOTE — Progress Notes (Signed)
**Note De-Identified Rafaela Dinius Obfuscation** Addended by: Demetrios Loll on: 06/10/2011 01:11 PM   Modules accepted: Orders

## 2011-06-11 ENCOUNTER — Telehealth: Payer: Self-pay

## 2011-06-11 ENCOUNTER — Telehealth: Payer: Self-pay | Admitting: Physician Assistant

## 2011-06-11 ENCOUNTER — Telehealth: Payer: Self-pay | Admitting: Internal Medicine

## 2011-06-11 LAB — BASIC METABOLIC PANEL
CO2: 27 mEq/L (ref 19–32)
Calcium: 10 mg/dL (ref 8.4–10.5)
Glucose, Bld: 169 mg/dL — ABNORMAL HIGH (ref 70–99)
Potassium: 6.3 mEq/L (ref 3.5–5.3)
Sodium: 137 mEq/L (ref 135–145)

## 2011-06-11 MED ORDER — PANTOPRAZOLE SODIUM 40 MG PO TBEC: 40.0000 mg | DELAYED_RELEASE_TABLET | Freq: Two times a day (BID) | ORAL | Status: DC

## 2011-06-11 NOTE — Telephone Encounter (Signed)
Pt. advised, she states she understands instructions given. RX sent to [facility] and lab orders faxed to [facility].

## 2011-06-11 NOTE — Telephone Encounter (Signed)
Spoke with pt- she just got out of the hospital with CHF, she said while she was in the hospital one of the nurses there told her she needed to be on the pantoprazole tid. Pt just wanted to know if she could have an rx for tid. I informed her that we normally dont have pts on it tid but I would ask. Pt is almost out of rx and needs either bid or tid sent to [facility].

## 2011-06-11 NOTE — Telephone Encounter (Signed)
[place] from [place] lab states that they re-checked pt's potassium level and it is critical at 6.3 (the same value as the first check). Please advise./LV

## 2011-06-11 NOTE — Telephone Encounter (Signed)
Nurse gave her erroneous information. Will continue BID.  RX sent to [facility] She should call when ready to reschedule her procedures or if any further problems.

## 2011-06-11 NOTE — Telephone Encounter (Signed)
Addended by: Tiffany Kocher on: 06/11/2011 11:44 AM   Modules accepted: Orders

## 2011-06-11 NOTE — Telephone Encounter (Signed)
Pt's husband called to let us know that pt was recently discharged from the hospital and is too weak to have EGD/TCS on the 9th. They do not want to [facility] at this time. Pt's husband also said that her prescription for pantopracole 40mg  needs to be changed to 3 times a day and to call prescription into [place]. She has one pill left.

## 2011-06-11 NOTE — Telephone Encounter (Signed)
Please clarify Protonix request. [name] increased her to BID. We don't typically RX for TID and her D/C summary from hospital did not say she was on TID.

## 2011-06-11 NOTE — Telephone Encounter (Signed)
Tell her to stop taking potassium tablets altogether. Send her a Rx for Kayexulate 15 gms 2 doses. It will cause her to have some diarrhea which is normal to drop the potassium. Follow-up BMET on Monday.

## 2011-06-11 NOTE — Telephone Encounter (Signed)
Pt called because she picked up her meds at the drugstore and was confused about how to take them.   Reviewed the notes, Kayexalate 15gm x 2 doses was called in. Pt to take 1 now and the other one had no instructions per the pharmacy. I advised the patient that she could take one now and one in a.m. She understands that she is not to take her potassium tablets. I also advised her not to take the Spironolactone 25 mg tablets until her labs were rechecked. The patient indicated understanding and stated that she would comply.

## 2011-06-11 NOTE — Telephone Encounter (Signed)
Procedure cancelled- routed to refill box

## 2011-06-12 ENCOUNTER — Telehealth: Payer: Self-pay | Admitting: Physician Assistant

## 2011-06-12 NOTE — Telephone Encounter (Signed)
Contacted re: ongoing management of elevated K+. Pt had not yet responded to 2 doses of Kayexalate. K+ 6.2, yesterday, with orders to hold supplemental K and spironolactone. Pt remains on Lasix. I recommended additional Rx with Kayexalate, and called in prescription to [facility].

## 2011-06-13 ENCOUNTER — Telehealth: Payer: Self-pay | Admitting: Physician Assistant

## 2011-06-13 NOTE — Telephone Encounter (Signed)
Just received a call from lab: K+ 3.2 today. I spoke with Rosey Bath, [facility], about the results, and also notified the pt. I recommended pt continue to hold supplemental K+, as well as Aldactone, until seen in [place] [facility] tomorrow, as scheduled. She is also due for f/u labs in AM, as well.

## 2011-06-14 ENCOUNTER — Telehealth: Payer: Self-pay | Admitting: Adult Health

## 2011-06-14 ENCOUNTER — Encounter: Payer: Self-pay | Admitting: Adult Health

## 2011-06-14 ENCOUNTER — Encounter: Payer: Medicare Other | Admitting: Adult Health

## 2011-06-14 ENCOUNTER — Ambulatory Visit (INDEPENDENT_AMBULATORY_CARE_PROVIDER_SITE_OTHER): Payer: 59 | Admitting: Adult Health

## 2011-06-14 VITALS — BP 134/85 | HR 96 | Resp 16 | Ht 64.0 in | Wt 196.0 lb

## 2011-06-14 DIAGNOSIS — I5022 Chronic systolic (congestive) heart failure: Secondary | ICD-10-CM

## 2011-06-14 DIAGNOSIS — I5021 Acute systolic (congestive) heart failure: Secondary | ICD-10-CM

## 2011-06-14 DIAGNOSIS — I509 Heart failure, unspecified: Secondary | ICD-10-CM

## 2011-06-14 LAB — BASIC METABOLIC PANEL
CO2: 35 mEq/L — ABNORMAL HIGH (ref 19–32)
Calcium: 10 mg/dL (ref 8.4–10.5)
Chloride: 95 mEq/L — ABNORMAL LOW (ref 96–112)
Creat: 1.22 mg/dL — ABNORMAL HIGH (ref 0.50–1.10)
Glucose, Bld: 109 mg/dL — ABNORMAL HIGH (ref 70–99)

## 2011-06-14 NOTE — Telephone Encounter (Signed)
Tried to call pt- LMOM 

## 2011-06-14 NOTE — Progress Notes (Signed)
HPI: Kathryn Morgan is a 71 y/o patient of Dr. Shirlee Latch we are following in the [facility] office for ongoing assessment and treatment of nonischemic CM, with systolic dysfunction, EF of 25%. She had no evidence of CAD per cardiac catheterization in 12/12. On last visit she was taken off of cozaar as she became very hypotensive on this. Systolic's were in the 60's and then low 90's at time. She was felt to be dehydrated on doses of lasix 40 mg daily. This was reduced to 20 mg daily.  A BMET was completed and she was found to be hyperkalemic with K+ of 6.2. I prescribed 30 gms of Kayexalate for this with follow-up BMET. She was to see me in 5 days. Over the 5 days, she called our service because of confusion of the dosing of Kayexalate. She was provided directions for the dosing. She called the following day because she had no bowel movements associated with the Kayexalate. She spoke with Mr. Shara Blazing who ordered a BMET and a second dose of Kayexalate. BMET revealed K+ of 3.2.  She states she felt fine and had no complaints of palpitations or chest discomfort. She is here today with weights and BP measurments I have asked her to take while at home. She had the final BMET today prior to coming in. She states that she is breathing better and does not need to wear portable O2 that she needed last week.  Allergies  Allergen Reactions  . Ace Inhibitors Cough  . Codeine Other (See Comments)    Does not know  . Phenergan     Current Outpatient Prescriptions  Medication Sig Dispense Refill  . aspirin 81 MG tablet Take 1 tablet (81 mg total) by mouth daily.      . carvedilol (COREG) 12.5 MG tablet Take 1 tablet (12.5 mg total) by mouth 2 (two) times daily with a meal.  60 tablet  6  . docusate sodium (COLACE) 100 MG capsule Take 100 mg by mouth 2 (two) times daily.        . fish oil-omega-3 fatty acids 1000 MG capsule Take 2 g by mouth daily.        . Fluticasone-Salmeterol (ADVAIR DISKUS) 250-50 MCG/DOSE AEPB  Inhale 1 puff into the lungs every 12 (twelve) hours.        . furosemide (LASIX) 40 MG tablet Take 1 tablet (40 mg total) by mouth daily.  90 tablet  3  . insulin regular (NOVOLIN R,HUMULIN R) 100 units/mL injection Inject into the skin as directed. Insulin pump 2.5 units every hours      . pantoprazole (PROTONIX) 40 MG tablet Take 1 tablet (40 mg total) by mouth 2 (two) times daily before a meal.  60 tablet  3  . simvastatin (ZOCOR) 20 MG tablet Take 20 mg by mouth at bedtime.        Marland Kitchen tiotropium (SPIRIVA) 18 MCG inhalation capsule Place 18 mcg into inhaler and inhale daily.          Past Medical History  Diagnosis Date  . DM (diabetes mellitus)     Insulin pump  . HTN (hypertension)   . Dyslipidemia   . Hepatitis C     per pt, hx of  . Depression   . Necrotizing pancreatitis   . Neuropathy   . Rhabdomyolysis   . Gallstone pancreatitis     2005  . Systolic CHF     New onset acute systolic CHF secondary to non-ischemic cardiomyopathy 05/2011  with no evidence for CAD by cath 06/03/11 - EF 35% by echo 05/31/11   . Aortic stenosis     Discrepancy between cath & echo - very close follow-up is needed for possible AVR. Severe by echo 05/31/11 but mod by cath 06/03/11  . Respiratory failure     Home O2 ordered 05/2011  . Abnormal thyroid blood test     05/2011 - decreased TSH on admission with normal free T4 but decreased free T3  . Right bundle-branch block     Past Surgical History  Procedure Date  . Esophagogastroduodenoscopy 04/21/04    normal esophagus/normal D1,D2; attempted  transgastric drainage/stent placement of pancreatic pseudocyst, no obvious location for gastrotomy, therefore transferred to [facility]  . Debridement pancreas   . Pancreatic pseudocyst drainage   . Cholecystectomy   . Mastectomy     WUJ:WJXBJY of systems complete and found to be negative unless listed above PHYSICAL EXAM BP 134/85  Pulse 96  Resp 16  Ht 5\' 4"  (1.626 m)  Wt 196 lb (88.905 kg)  BMI  33.64 kg/m2 General: Well developed, well nourished, in no acute distress Head: Eyes PERRLA, No xanthomas.   Normal cephalic and atramatic  Lungs: Clear bilaterally to auscultation and percussion. Heart: HRRR S1 S2, 1/6 systolic murmur. pulses are 2+ & equal.            No carotid bruit. No JVD.  No abdominal bruits. No femoral bruits. Abdomen: Bowel sounds are positive, abdomen soft and non-tender without masses or  Hernia's noted. Msk:  Back normal, normal gait. Normal strength and tone for age. Extremities: No clubbing, cyanosis or edema.  DP +1 Neuro: Alert and oriented X 3. Psych:  Good affect, responds appropriately  ASSESSMENT AND PLAN

## 2011-06-14 NOTE — Telephone Encounter (Signed)
PT STATES SHE WAS PUT ON NEW MEDICATIONS THAT MAY CAUSE DIARRHEA AND WOULD LIKE TO MAKE SURE SHE CAN TAKE SOMETHING OVER THE COUNTER FOR THAT.

## 2011-06-14 NOTE — Assessment & Plan Note (Addendum)
I have reviewed her weights and BP from home. She is ranging in the 115-100 systolic without the Cozaar. Wt is running 195 lbs to 197 lbs. No wt gain. She has no evidence of fluid overload on exam. HR is in the 90's initially, but now in the 80's on exam. I have reviewed her labs and found her have K+ of 3.6 off of the potassium and spironolactone, with Creatinine of 1.2.   I will restart the spironolactone at 12.5 mg daily. She is to continue to take her BP daily and wt daily. May need to titrate up the coreg to 18.75 mg BID for better heart rate control. Will her sensitivity to medications, will do this on next visit. Can consider adding back ACE-inhibitor at very low dose on next appointment as well. Do not want to make a lot of changes on this appointment as she became confused with medications on last appointment. She will need to have repeat ECHO in March of 2013. She will continue the lasix 20 mg daily. Slowly maximize her medications.

## 2011-06-14 NOTE — Telephone Encounter (Signed)
**Note De-Identified Adael Culbreath Obfuscation** Pt. states she did not call this office this morning and that all her questions were answered over the weekend by Gene Sherpe, PA. Pt. Advised to have lab drawn today before her scheduled visit with [place], she verbalized understanding./LV

## 2011-06-14 NOTE — Patient Instructions (Signed)
**Note De-Identified Kathryn Morgan Obfuscation** Your physician recommends that you weigh, daily, at the same time every day, and in the same amount of clothing. Please record your daily weights on the handout provided and bring it to your next appointment.  Your physician recommends that you continue on your current medications as directed. Please refer to the Current Medication list given to you today.  Your physician recommends that you schedule a follow-up appointment in: 1 month

## 2011-06-16 ENCOUNTER — Ambulatory Visit (HOSPITAL_COMMUNITY): Admission: RE | Admit: 2011-06-16 | Payer: 59 | Source: Ambulatory Visit | Admitting: Internal Medicine

## 2011-06-16 ENCOUNTER — Encounter (HOSPITAL_COMMUNITY): Admission: RE | Payer: Self-pay | Source: Ambulatory Visit

## 2011-06-16 SURGERY — COLONOSCOPY WITH ESOPHAGOGASTRODUODENOSCOPY (EGD)
Anesthesia: Moderate Sedation

## 2011-06-21 ENCOUNTER — Telehealth: Payer: Self-pay | Admitting: Adult Health

## 2011-06-21 NOTE — Telephone Encounter (Signed)
PT HAS GAIN 5 LBS IN 1 WEEK

## 2011-06-21 NOTE — Telephone Encounter (Signed)
Pt. reports that she weighed 195 lbs. last Sunday (06-13-11) and that she weighed 200.9 lbs. yesterday. She states her weight is 200 lbs this morning. She has no complaints and states that she "feels good" with no swelling or SOB. Please advise./LV

## 2011-06-21 NOTE — Telephone Encounter (Signed)
She will need to take on extra dose of Lasix 20 mg for 5 lb wt gain. She is to stay away from salt. Have her come in for BP check this week. Make sure she has follow-up appointment.

## 2011-06-22 ENCOUNTER — Telehealth: Payer: Self-pay

## 2011-06-22 NOTE — Telephone Encounter (Signed)
**Note De-Identified Amisha Pospisil Obfuscation** Jasmine December, home health nurse, would like to know if she and the physical therapist, who is also working with pt., can call the office to report pt's BP in stead of pt. having to come to a BP check this week. Jasmine December states that pt. did take extra 20 mg of Lasix this morning. She states that pt's BP this morning is 128/78 and that the pt's lungs are clear, slight edema noted in ankles and her weight is 199 lbs. this morning. Pt. has PT on Thursday and the therapist will call to report her BP at that time. Jasmine December is advised that this is ok and that we will contact her if Joni Reining, NP wants pt. to come to BP check.

## 2011-06-24 ENCOUNTER — Telehealth: Payer: Self-pay | Admitting: Adult Health

## 2011-06-24 NOTE — Telephone Encounter (Signed)
Ok for them to call the BP's in. Continue daily wts.   Joni Reining NP

## 2011-06-24 NOTE — Telephone Encounter (Signed)
Ok. No changes in medications Kathryn Morgan

## 2011-06-24 NOTE — Telephone Encounter (Signed)
HOME HEALTH CALLED IN BP TODAY IS 108/70 AND HAS BEEN BETWEEN 105-110/70 SINCE LAST VISIT.

## 2011-06-25 ENCOUNTER — Telehealth: Payer: Self-pay | Admitting: *Deleted

## 2011-06-25 NOTE — Telephone Encounter (Signed)
Made patient aware of Kathryn's recommendations to keep medications the same.  Pt verbalizes understanding.

## 2011-07-05 ENCOUNTER — Telehealth: Payer: Self-pay | Admitting: *Deleted

## 2011-07-05 NOTE — Telephone Encounter (Signed)
This is appropriate for now. Would like to see BP a little lower, but she doesn't tolerate it so low without symptoms.

## 2011-07-05 NOTE — Telephone Encounter (Signed)
Received a call from [facility] nurse.  States bp have been running 120's over 60's and 70's and patient was stating concern that they may be too high.  Advised her to continue on current regimen, as these are adequate blood pressures and Dr Diona Browner will address this if he feels necessary at follow up visit tomorrow.  Also states having sinus issues, which should be managed by PCP.

## 2011-07-06 ENCOUNTER — Ambulatory Visit (INDEPENDENT_AMBULATORY_CARE_PROVIDER_SITE_OTHER): Payer: 59 | Admitting: Cardiology

## 2011-07-06 ENCOUNTER — Encounter: Payer: Self-pay | Admitting: Cardiology

## 2011-07-06 VITALS — BP 127/81 | HR 93 | Ht 64.0 in | Wt 195.0 lb

## 2011-07-06 DIAGNOSIS — I359 Nonrheumatic aortic valve disorder, unspecified: Secondary | ICD-10-CM

## 2011-07-06 DIAGNOSIS — I5022 Chronic systolic (congestive) heart failure: Secondary | ICD-10-CM

## 2011-07-06 DIAGNOSIS — I429 Cardiomyopathy, unspecified: Secondary | ICD-10-CM

## 2011-07-06 DIAGNOSIS — E782 Mixed hyperlipidemia: Secondary | ICD-10-CM

## 2011-07-06 DIAGNOSIS — I10 Essential (primary) hypertension: Secondary | ICD-10-CM

## 2011-07-06 LAB — BASIC METABOLIC PANEL
CO2: 30 mEq/L (ref 19–32)
Chloride: 98 mEq/L (ref 96–112)
Glucose, Bld: 154 mg/dL — ABNORMAL HIGH (ref 70–99)
Potassium: 4.9 mEq/L (ref 3.5–5.3)
Sodium: 138 mEq/L (ref 135–145)

## 2011-07-06 NOTE — Assessment & Plan Note (Signed)
Nonischemic with no significant CAD by cardiac catheterization. LVEF 35% initially.

## 2011-07-06 NOTE — Assessment & Plan Note (Signed)
Symptomatically stable, including weight and blood pressure. Will check BMET and magnesium. Not on ACE-I secondary to history of cough. Can consider low dose ARB depending on blood pressure trend. Followup assessment of LVEF pending.

## 2011-07-06 NOTE — Assessment & Plan Note (Signed)
Plan to repeat echocardiogram through our [facility] office for careful assessment of degree of aortic stenosis (mild to moderate at catheterization, worse by echocardiogram but not certain if all measurements accurate). This will hopefully help determine if observation appropriate versus intervention sooner rather than later. May need to consider TEE.

## 2011-07-06 NOTE — Patient Instructions (Signed)
Your physician recommends that you schedule a follow-up appointment in: 1 week  Your physician has requested that you have an echocardiogram. Echocardiography is a painless test that uses sound waves to create images of your heart. It provides your doctor with information about the size and shape of your heart and how well your heart's chambers and valves are working. This procedure takes approximately one hour. There are no restrictions for this procedure.

## 2011-07-06 NOTE — Assessment & Plan Note (Signed)
Blood pressure control is generally good.

## 2011-07-06 NOTE — Progress Notes (Signed)
Clinical Summary Kathryn Morgan is a 71 y.o.female presenting for followup. This is actually my first meeting with her - she has been seeing Kathryn Morgan since hospitalization at [facility]. I reviewed her history and recent testing. Main cardiac issues have been CHF with nonischemic cardiomyopathy and aortic stenosis, the degree of which has been inconsistent in comparison of echocardiogram and cardiac catheterization.  I reviewed the echocardiographic images from December and the aortic valve appears to be perhaps functionally bicuspid with reduced excursion and calcification, however the velocities and gradients vary. The only images obtained in apical view that show position of Doppler for gradient measurement provide a mean gradient of only 32 mmHg. The higher gradients reported in the study are obtained without actually showing the position of the Doppler, and therfore I  cannot be certain about their accuracy. The tricuspid regurgitant jet is eccentric.  Labwork from 1/4 showed potassium 3.6, BUN 43, creatinine 1.2. Medication were reviewed and discussed today.  She brings in BP and weight recordings for review. Overall she has been doing well. Blood pressure has been generally well controlled.  We discussed her conditions and plan going forward.  Allergies  Allergen Reactions  . Ace Inhibitors Cough  . Codeine Other (See Comments)    Does not know  . Phenergan     Current Outpatient Prescriptions  Medication Sig Dispense Refill  . aspirin 81 MG tablet Take 1 tablet (81 mg total) by mouth daily.      . carvedilol (COREG) 12.5 MG tablet Take 1 tablet (12.5 mg total) by mouth 2 (two) times daily with a meal.  60 tablet  6  . docusate sodium (COLACE) 100 MG capsule Take 100 mg by mouth 2 (two) times daily.        . fish oil-omega-3 fatty acids 1000 MG capsule Take 2 g by mouth daily.        . Fluticasone-Salmeterol (ADVAIR DISKUS) 250-50 MCG/DOSE AEPB Inhale 1 puff into the lungs every  12 (twelve) hours.        . furosemide (LASIX) 40 MG tablet Take 1 tablet (40 mg total) by mouth daily.  90 tablet  3  . insulin detemir (LEVEMIR) 100 UNIT/ML injection Inject 100 Units into the skin at bedtime.      . pantoprazole (PROTONIX) 40 MG tablet Take 1 tablet (40 mg total) by mouth 2 (two) times daily before a meal.  60 tablet  3  . simvastatin (ZOCOR) 20 MG tablet Take 20 mg by mouth at bedtime.        . sodium chloride (OCEAN) 0.65 % nasal spray Place 1 spray into the nose as needed.      Marland Kitchen spironolactone (ALDACTONE) 25 MG tablet Take 12.5 mg by mouth daily.      Marland Kitchen tiotropium (SPIRIVA) 18 MCG inhalation capsule Place 18 mcg into inhaler and inhale daily.          Past Medical History  Diagnosis Date  . Type 2 diabetes mellitus     Insulin pump  . Essential hypertension, benign   . Dyslipidemia   . History of hepatitis C   . Depression   . Necrotizing pancreatitis   . Neuropathy   . Rhabdomyolysis   . Gallstone pancreatitis     2005  . Chronic systolic heart failure     LVEF 35%  . Aortic stenosis     Moderate to severe  . Respiratory failure     Home O2 ordered 05/2011  .  Right bundle-branch block     Past Surgical History  Procedure Date  . Esophagogastroduodenoscopy 04/21/04    Normal esophagus/normal D1,D2; attempted  transgastric drainage/stent placement of pancreatic pseudocyst, no obvious location for gastrotomy, therefore transferred to [facility]  . Debridement pancreas   . Pancreatic pseudocyst drainage   . Cholecystectomy   . Mastectomy     Family History  Problem Relation Age of Onset  . Colon cancer Neg Hx   . Heart disease      CHF    Social History Kathryn Morgan reports that she quit smoking about 30 years ago. Her smoking use included Cigarettes. She smoked 1 pack per day. She does not have any smokeless tobacco history on file. Kathryn Morgan reports that she does not drink alcohol.  Review of Systems No palpitations or syncope. Stable  appetite. Paying close attention to salt. No orthopnea or PND. Otherwise negative.  Physical Examination Filed Vitals:   07/06/11 1335  BP: 127/81  Pulse: 93   Obese woman in NAD. HEENT: Conjunctiva and lids normal, oropharynx clear. Neck: Supple, no elevated JVP or carotid bruits, no thyromegaly. Lungs: Clear to auscultation, nonlabored breathing at rest. Cardiac: Regular rate and rhythm, no S3, indistinct PMI, 3/6 systolic murmur with preserved second heart sound, no pericardial rub. Abdomen: Soft, nontender, bowel sounds present, no guarding or rebound. Extremities: Trace edema, distal pulses 2+. Skin: Warm and dry. Musculoskeletal: No kyphosis. Neuropsychiatric: Alert and oriented x3, affect grossly appropriate.    Problem List and Plan

## 2011-07-06 NOTE — Assessment & Plan Note (Signed)
Continues on statin therapy. 

## 2011-07-07 ENCOUNTER — Other Ambulatory Visit (HOSPITAL_COMMUNITY): Payer: Self-pay | Admitting: Radiology

## 2011-07-07 ENCOUNTER — Other Ambulatory Visit (HOSPITAL_COMMUNITY): Payer: Self-pay | Admitting: Cardiology

## 2011-07-07 ENCOUNTER — Ambulatory Visit (HOSPITAL_COMMUNITY): Payer: Medicare Other | Attending: Cardiovascular Disease | Admitting: Radiology

## 2011-07-07 ENCOUNTER — Encounter: Payer: Self-pay | Admitting: *Deleted

## 2011-07-07 DIAGNOSIS — I359 Nonrheumatic aortic valve disorder, unspecified: Secondary | ICD-10-CM | POA: Insufficient documentation

## 2011-07-07 DIAGNOSIS — J4489 Other specified chronic obstructive pulmonary disease: Secondary | ICD-10-CM | POA: Insufficient documentation

## 2011-07-07 DIAGNOSIS — J449 Chronic obstructive pulmonary disease, unspecified: Secondary | ICD-10-CM | POA: Insufficient documentation

## 2011-07-07 DIAGNOSIS — B1920 Unspecified viral hepatitis C without hepatic coma: Secondary | ICD-10-CM | POA: Insufficient documentation

## 2011-07-07 DIAGNOSIS — E119 Type 2 diabetes mellitus without complications: Secondary | ICD-10-CM | POA: Insufficient documentation

## 2011-07-07 DIAGNOSIS — I4510 Unspecified right bundle-branch block: Secondary | ICD-10-CM | POA: Insufficient documentation

## 2011-07-07 DIAGNOSIS — I429 Cardiomyopathy, unspecified: Secondary | ICD-10-CM

## 2011-07-07 DIAGNOSIS — I350 Nonrheumatic aortic (valve) stenosis: Secondary | ICD-10-CM

## 2011-07-07 DIAGNOSIS — Z87891 Personal history of nicotine dependence: Secondary | ICD-10-CM | POA: Insufficient documentation

## 2011-07-07 DIAGNOSIS — I10 Essential (primary) hypertension: Secondary | ICD-10-CM | POA: Insufficient documentation

## 2011-07-07 NOTE — Progress Notes (Signed)
Quick Note:  Potassium 4.9, BUN and creatinine stable. Magnesium normal at 2.2. Continue same therapy for now. ______

## 2011-07-08 ENCOUNTER — Other Ambulatory Visit: Payer: Self-pay

## 2011-07-08 ENCOUNTER — Telehealth: Payer: Self-pay | Admitting: Cardiology

## 2011-07-08 DIAGNOSIS — I359 Nonrheumatic aortic valve disorder, unspecified: Secondary | ICD-10-CM

## 2011-07-08 NOTE — Telephone Encounter (Signed)
Patient would like results of lab work / tg  °

## 2011-07-12 ENCOUNTER — Ambulatory Visit: Payer: 59 | Admitting: Adult Health

## 2011-07-12 ENCOUNTER — Telehealth: Payer: Self-pay

## 2011-07-12 NOTE — Telephone Encounter (Signed)
Pt. and her husband advised of Dobutamine Echo scheduled for 07-26-11 at 3:00 in [place] office. They both verbalized understanding./LV

## 2011-07-14 ENCOUNTER — Ambulatory Visit: Payer: 59 | Admitting: Cardiology

## 2011-07-26 ENCOUNTER — Ambulatory Visit (HOSPITAL_COMMUNITY): Payer: Medicare Other | Attending: Cardiology | Admitting: Radiology

## 2011-07-26 ENCOUNTER — Ambulatory Visit (HOSPITAL_BASED_OUTPATIENT_CLINIC_OR_DEPARTMENT_OTHER): Payer: Medicare Other | Admitting: Radiology

## 2011-07-26 ENCOUNTER — Other Ambulatory Visit (HOSPITAL_COMMUNITY): Payer: Self-pay | Admitting: Cardiology

## 2011-07-26 DIAGNOSIS — I350 Nonrheumatic aortic (valve) stenosis: Secondary | ICD-10-CM

## 2011-07-26 DIAGNOSIS — B1920 Unspecified viral hepatitis C without hepatic coma: Secondary | ICD-10-CM | POA: Insufficient documentation

## 2011-07-26 DIAGNOSIS — R0789 Other chest pain: Secondary | ICD-10-CM | POA: Insufficient documentation

## 2011-07-26 DIAGNOSIS — I359 Nonrheumatic aortic valve disorder, unspecified: Secondary | ICD-10-CM

## 2011-07-26 DIAGNOSIS — E119 Type 2 diabetes mellitus without complications: Secondary | ICD-10-CM | POA: Insufficient documentation

## 2011-07-26 DIAGNOSIS — I509 Heart failure, unspecified: Secondary | ICD-10-CM | POA: Insufficient documentation

## 2011-07-26 DIAGNOSIS — I10 Essential (primary) hypertension: Secondary | ICD-10-CM | POA: Insufficient documentation

## 2011-07-26 DIAGNOSIS — R0989 Other specified symptoms and signs involving the circulatory and respiratory systems: Secondary | ICD-10-CM

## 2011-07-27 ENCOUNTER — Ambulatory Visit (HOSPITAL_COMMUNITY)
Admission: RE | Admit: 2011-07-27 | Discharge: 2011-07-27 | Disposition: A | Discharge status: Home / Self Care | Payer: Medicare Other | Source: Ambulatory Visit | Attending: Pulmonary Disease | Admitting: Pulmonary Disease

## 2011-07-27 ENCOUNTER — Encounter (HOSPITAL_COMMUNITY): Payer: Self-pay | Admitting: *Deleted

## 2011-07-27 DIAGNOSIS — R0989 Other specified symptoms and signs involving the circulatory and respiratory systems: Secondary | ICD-10-CM | POA: Insufficient documentation

## 2011-07-27 DIAGNOSIS — R0609 Other forms of dyspnea: Secondary | ICD-10-CM | POA: Insufficient documentation

## 2011-07-27 MED ORDER — ALBUTEROL SULFATE (5 MG/ML) 0.5% IN NEBU
2.5000 mg | INHALATION_SOLUTION | Freq: Once | RESPIRATORY_TRACT | Status: AC
  Administered 2011-07-27: 2.5 mg via RESPIRATORY_TRACT

## 2011-07-29 ENCOUNTER — Ambulatory Visit (HOSPITAL_COMMUNITY)
Admission: RE | Admit: 2011-07-29 | Discharge: 2011-07-29 | Disposition: A | Discharge status: Home / Self Care | Payer: Medicare Other | Source: Ambulatory Visit | Attending: Family Medicine | Admitting: Family Medicine

## 2011-07-29 ENCOUNTER — Telehealth: Payer: Self-pay | Admitting: Cardiology

## 2011-07-29 DIAGNOSIS — Z139 Encounter for screening, unspecified: Secondary | ICD-10-CM

## 2011-07-29 DIAGNOSIS — Z1231 Encounter for screening mammogram for malignant neoplasm of breast: Secondary | ICD-10-CM | POA: Insufficient documentation

## 2011-07-29 NOTE — Telephone Encounter (Signed)
Patient would like results of Stress Echo done on Monday. / tg

## 2011-07-29 NOTE — Telephone Encounter (Signed)
Spoke with patient regarding results of Echo per her request.  Due to result, would like an earlier appointment and we will accommodate this request.

## 2011-07-30 ENCOUNTER — Encounter: Payer: Self-pay | Admitting: Cardiology

## 2011-07-30 ENCOUNTER — Ambulatory Visit (INDEPENDENT_AMBULATORY_CARE_PROVIDER_SITE_OTHER): Payer: Medicare Other | Admitting: Cardiology

## 2011-07-30 VITALS — BP 121/72 | HR 87 | Resp 16 | Ht 64.0 in | Wt 194.0 lb

## 2011-07-30 DIAGNOSIS — J449 Chronic obstructive pulmonary disease, unspecified: Secondary | ICD-10-CM

## 2011-07-30 DIAGNOSIS — I5022 Chronic systolic (congestive) heart failure: Secondary | ICD-10-CM

## 2011-07-30 DIAGNOSIS — E782 Mixed hyperlipidemia: Secondary | ICD-10-CM

## 2011-07-30 DIAGNOSIS — I350 Nonrheumatic aortic (valve) stenosis: Secondary | ICD-10-CM

## 2011-07-30 DIAGNOSIS — I10 Essential (primary) hypertension: Secondary | ICD-10-CM

## 2011-07-30 DIAGNOSIS — I429 Cardiomyopathy, unspecified: Secondary | ICD-10-CM

## 2011-07-30 DIAGNOSIS — I359 Nonrheumatic aortic valve disorder, unspecified: Secondary | ICD-10-CM

## 2011-07-30 NOTE — Assessment & Plan Note (Signed)
Severe aortic stenosis based on further assessment with dobutamine echocardiogram, as outlined above. Please refer to full report. She has low output/low gradient aortic stenosis with LVEF of 30-35% at baseline. Referral is being made to Dr. Cornelius Moras for discussion of aortic valve replacement, realizing that comorbidities will increase her surgical risk profile. She underwent cardiac catheterization in December 2012, reportedly had PFTs done in the interval, although I have not been able to locate the results. I made no medication changes today.

## 2011-07-30 NOTE — Assessment & Plan Note (Signed)
LVEF of 30-35% by recent evaluation.

## 2011-07-30 NOTE — Assessment & Plan Note (Signed)
Blood pressure has been well controlled. 

## 2011-07-30 NOTE — Assessment & Plan Note (Signed)
She continues on simvastatin. 

## 2011-07-30 NOTE — Patient Instructions (Signed)
**Note De-Identified Kavontae Pritchard Obfuscation** Your physician recommends that you continue on your current medications as directed. Please refer to the Current Medication list given to you today.  Your physician recommends that you see Dr. Cornelius Moras at [facility], we will arrange  Your physician recommends that you schedule a follow-up appointment in: 1 month

## 2011-07-30 NOTE — Progress Notes (Signed)
Clinical Summary Ms. Nierenberg is a medically complex 71 y.o.female presenting for followup. I met her in late January after inpatient evaluation at [facility]. I referred her for followup testing to better understand the degree of her aortic stenosis. LVEF was in the range of 30-35% and she underwent a dobutamine study that was consistent with severe aortic stenosis (mean/peak gradients 62/94 mmHg at 20 mcg dose) which suggests at baseline she had low output, low gradient AS. We discussed these results and implications today.  Clinically, she is doing fairly well at this time, reports NYHA class II dyspnea on exertion, no chest pain, no syncope. Her husband confirms that she has been doing well. She reports compliance with her medications.  She indicates that she has had PFTs done at [facility], although I was not able to locate the results as yet.  Today we discussed referral to cardiothoracic surgery for evaluation for aortic valve replacement. She does have significant comorbidities which will need to be considered, however is clinically quite stable at this time, and perhaps now would be a good window to consider intervention. She has already undergone cardiac catheterization in December which showed no obstructive CAD.   Allergies  Allergen Reactions  . Ace Inhibitors Cough  . Codeine Other (See Comments)    Does not know  . Phenergan     Current Outpatient Prescriptions  Medication Sig Dispense Refill  . aspirin 81 MG tablet Take 1 tablet (81 mg total) by mouth daily.      . carvedilol (COREG) 12.5 MG tablet Take 1 tablet (12.5 mg total) by mouth 2 (two) times daily with a meal.  60 tablet  6  . docusate sodium (COLACE) 100 MG capsule Take 100 mg by mouth 2 (two) times daily.        . fish oil-omega-3 fatty acids 1000 MG capsule Take 2 g by mouth daily.        . Fluticasone-Salmeterol (ADVAIR DISKUS) 250-50 MCG/DOSE AEPB Inhale 1 puff into the lungs every 12 (twelve) hours.        .  furosemide (LASIX) 40 MG tablet Take 1 tablet (40 mg total) by mouth daily.  90 tablet  3  . insulin detemir (LEVEMIR) 100 UNIT/ML injection Inject 40 Units into the skin at bedtime.       . Insulin Lispro, Human, (HUMALOG KWIKPEN Kline) Inject into the skin as directed.      . pantoprazole (PROTONIX) 40 MG tablet Take 1 tablet (40 mg total) by mouth 2 (two) times daily before a meal.  60 tablet  3  . simvastatin (ZOCOR) 20 MG tablet Take 20 mg by mouth at bedtime.        . sodium chloride (OCEAN) 0.65 % nasal spray Place 1 spray into the nose as needed.      Marland Kitchen spironolactone (ALDACTONE) 25 MG tablet Take 12.5 mg by mouth daily.      Marland Kitchen tiotropium (SPIRIVA) 18 MCG inhalation capsule Place 18 mcg into inhaler and inhale daily.          Past Medical History  Diagnosis Date  . Type 2 diabetes mellitus     Insulin pump  . Essential hypertension, benign   . Dyslipidemia   . History of hepatitis C   . Depression   . Necrotizing pancreatitis   . Neuropathy   . Rhabdomyolysis   . Gallstone pancreatitis     2005  . Chronic systolic heart failure     LVEF 35%  .  Aortic stenosis     Moderate to severe  . Respiratory failure     Home O2 ordered 05/2011  . Right bundle-branch block     Past Surgical History  Procedure Date  . Esophagogastroduodenoscopy 04/21/04    Normal esophagus/normal D1,D2; attempted  transgastric drainage/stent placement of pancreatic pseudocyst, no obvious location for gastrotomy, therefore transferred to [facility]  . Debridement pancreas   . Pancreatic pseudocyst drainage   . Cholecystectomy   . Mastectomy     Family History  Problem Relation Age of Onset  . Colon cancer Neg Hx   . Heart disease      CHF    Social History Ms. Hershberger reports that she quit smoking about 30 years ago. Her smoking use included Cigarettes. She smoked 1 pack per day. She does not have any smokeless tobacco history on file. Ms. Shambaugh reports that she does not drink  alcohol.  Review of Systems Negative except as outlined above.  Physical Examination Filed Vitals:   07/30/11 1444  BP: 121/72  Pulse: 87  Resp: 16    Obese woman in NAD.  HEENT: Conjunctiva and lids normal, oropharynx clear.  Neck: Supple, no elevated JVP or carotid bruits, no thyromegaly.  Lungs: Clear to auscultation, nonlabored breathing at rest.  Cardiac: Regular rate and rhythm, no S3, indistinct PMI, 3/6 systolic murmur with decreased second heart sound, no pericardial rub.  Abdomen: Soft, nontender, bowel sounds present, no guarding or rebound.  Extremities: Trace edema, distal pulses 2+.  Skin: Warm and dry.  Musculoskeletal: No kyphosis.  Neuropsychiatric: Alert and oriented x3, affect grossly appropriate.    Problem List and Plan

## 2011-08-02 ENCOUNTER — Ambulatory Visit (HOSPITAL_COMMUNITY)
Admission: RE | Admit: 2011-08-02 | Discharge: 2011-08-02 | Disposition: A | Discharge status: Home / Self Care | Payer: Medicare Other | Source: Ambulatory Visit | Attending: Family Medicine | Admitting: Family Medicine

## 2011-08-02 DIAGNOSIS — M949 Disorder of cartilage, unspecified: Secondary | ICD-10-CM | POA: Insufficient documentation

## 2011-08-02 DIAGNOSIS — Z139 Encounter for screening, unspecified: Secondary | ICD-10-CM

## 2011-08-02 DIAGNOSIS — Z13820 Encounter for screening for osteoporosis: Secondary | ICD-10-CM | POA: Insufficient documentation

## 2011-08-02 DIAGNOSIS — M899 Disorder of bone, unspecified: Secondary | ICD-10-CM | POA: Insufficient documentation

## 2011-08-04 ENCOUNTER — Other Ambulatory Visit: Payer: Self-pay | Admitting: Family Medicine

## 2011-08-04 DIAGNOSIS — R928 Other abnormal and inconclusive findings on diagnostic imaging of breast: Secondary | ICD-10-CM

## 2011-08-06 ENCOUNTER — Encounter: Payer: Medicare Other | Admitting: Thoracic Surgery (Cardiothoracic Vascular Surgery)

## 2011-08-10 ENCOUNTER — Encounter: Payer: Medicare Other | Admitting: Thoracic Surgery (Cardiothoracic Vascular Surgery)

## 2011-08-10 ENCOUNTER — Other Ambulatory Visit: Payer: Self-pay | Admitting: Thoracic Surgery (Cardiothoracic Vascular Surgery)

## 2011-08-10 ENCOUNTER — Institutional Professional Consult (permissible substitution) (INDEPENDENT_AMBULATORY_CARE_PROVIDER_SITE_OTHER): Payer: Medicare Other | Admitting: Thoracic Surgery (Cardiothoracic Vascular Surgery)

## 2011-08-10 ENCOUNTER — Encounter: Payer: Self-pay | Admitting: Thoracic Surgery (Cardiothoracic Vascular Surgery)

## 2011-08-10 VITALS — BP 136/79 | HR 86 | Resp 16 | Ht 63.5 in | Wt 193.0 lb

## 2011-08-10 DIAGNOSIS — I359 Nonrheumatic aortic valve disorder, unspecified: Secondary | ICD-10-CM

## 2011-08-10 DIAGNOSIS — I350 Nonrheumatic aortic (valve) stenosis: Secondary | ICD-10-CM

## 2011-08-10 DIAGNOSIS — M810 Age-related osteoporosis without current pathological fracture: Secondary | ICD-10-CM | POA: Insufficient documentation

## 2011-08-10 DIAGNOSIS — I5022 Chronic systolic (congestive) heart failure: Secondary | ICD-10-CM

## 2011-08-10 DIAGNOSIS — E669 Obesity, unspecified: Secondary | ICD-10-CM | POA: Insufficient documentation

## 2011-08-10 NOTE — Progress Notes (Signed)
[address]            [name] [address]          [phone]     CARDIOTHORACIC SURGERY CONSULTATION REPORT  PCP is [name], MD, MD Referring Provider is MCDOWELL, Illene Bolus, MD  Chief Complaint  Patient presents with  . Shortness of Breath    with exertion.......ECHO....aortic stenosis    HPI:  Patient is a 71 year old married white female from [place], [place] with long-standing history of heart murmur and history of rheumatic fever as a child who was recently discovered to have severe symptomatic aortic stenosis.  The patient describes no history of heart murmur dating back to her childhood. Over the last 3 years she has developed progressive symptoms of exertional shortness of breath. This false symptoms progressed somewhat dramatically until December when she was hospitalized with class IV congestive heart failure.  Echocardiograms performed at that time demonstrated severe aortic stenosis with moderate to severe left ventricular dysfunction. Left and right heart catheterization was performed demonstrating normal coronary artery anatomy with no significant coronary artery disease. Gradients measured the time of catheterization suggested that the patient's aortic stenosis was perhaps moderate in severity and the patient was treated medically. She has been seen in followup and repeat transthoracic echocardiogram suggested severe aortic stenosis.  Dobutamine stress echocardiogram was performed confirming the presence of severe aortic stenosis with increased gradient during stress. The patient has been referred to consider elective aortic valve replacement.  Past Medical History  Diagnosis Date  . Type 2 diabetes mellitus     Insulin pump  . Essential hypertension, benign   . Dyslipidemia   . Depression   . Necrotizing pancreatitis   . Neuropathy   . Rhabdomyolysis   . Gallstone pancreatitis     2005  . Chronic systolic heart  failure     LVEF 35%  . Aortic stenosis     Moderate to severe  . Respiratory failure     Home O2 ordered 05/2011  . Right bundle-branch block   . Rheumatic fever   . Obesity (BMI 30-39.9) 08/10/2011  . Osteoporosis 08/10/2011  . GERD (gastroesophageal reflux disease)   . Allergy     Past Surgical History  Procedure Date  . Esophagogastroduodenoscopy 04/21/04    Normal esophagus/normal D1,D2; attempted  transgastric drainage/stent placement of pancreatic pseudocyst, no obvious location for gastrotomy, therefore transferred to [facility]  . Debridement pancreas   . Pancreatic pseudocyst drainage   . Cholecystectomy     Family History  Problem Relation Age of Onset  . Colon cancer Neg Hx   . Heart disease      CHF    Social History History  Substance Use Topics  . Smoking status: Former Smoker -- 3.0 packs/day for 15 years    Types: Cigarettes    Quit date: 06/09/1981  . Smokeless tobacco: Not on file   Comment: quit about 20+ yrs ago  . Alcohol Use: No    Current Outpatient Prescriptions  Medication Sig Dispense Refill  . aspirin 81 MG tablet Take 1 tablet (81 mg total) by mouth daily.      . carvedilol (COREG) 12.5 MG tablet Take 1 tablet (12.5 mg total) by mouth 2 (two) times daily with a meal.  60 tablet  6  . cetirizine (ZYRTEC) 10 MG tablet Take 10 mg by mouth daily.      Marland Kitchen  docusate sodium (COLACE) 100 MG capsule Take 100 mg by mouth 2 (two) times daily.        . fish oil-omega-3 fatty acids 1000 MG capsule Take 2 g by mouth daily.        . Fluticasone-Salmeterol (ADVAIR DISKUS) 250-50 MCG/DOSE AEPB Inhale 1 puff into the lungs every 12 (twelve) hours.        . furosemide (LASIX) 40 MG tablet Take 1 tablet (40 mg total) by mouth daily.  90 tablet  3  . guaiFENesin (MUCINEX) 600 MG 12 hr tablet Take 1,200 mg by mouth 2 (two) times daily.      . insulin detemir (LEVEMIR) 100 UNIT/ML injection Inject 40 Units into the skin at bedtime.       . Insulin Lispro, Human,  (HUMALOG KWIKPEN [place]) Inject into the skin as directed.      . multivitamin (THERAGRAN) per tablet Take 1 tablet by mouth daily.      . pantoprazole (PROTONIX) 40 MG tablet Take 1 tablet (40 mg total) by mouth 2 (two) times daily before a meal.  60 tablet  3  . simvastatin (ZOCOR) 20 MG tablet Take 20 mg by mouth at bedtime.        . sodium chloride (OCEAN) 0.65 % nasal spray Place 1 spray into the nose as needed.      Marland Kitchen spironolactone (ALDACTONE) 25 MG tablet Take 12.5 mg by mouth daily.      Marland Kitchen tiotropium (SPIRIVA) 18 MCG inhalation capsule Place 18 mcg into inhaler and inhale daily.          Allergies  Allergen Reactions  . Ace Inhibitors Cough  . Codeine Other (See Comments)    Does not know  . Levaquin Itching  . Phenergan     Review of Systems:  General:  normal appetite, decreased energy   Respiratory:  no cough, no wheezing, no hemoptysis, no pain with inspiration or cough, + exertional shortness of breath   Cardiac:   no chest pain or tightness, + exertional SOB, no resting SOB at present but resting SOB present at time of hospitalization in December, + PND in December, + orthopnea, + LE edema in December, none presently, no palpitations, no syncope  GI:   no difficulty swallowing, + reflux, no hematochezia, no hematemesis, no melena, no constipation, no diarrhea  GU:   no dysuria, no urgency, no frequency   Musculoskeletal: no arthritis, no arthralgia except some low back pain  Vascular:  no pain suggestive of claudication   Neuro:   no symptoms suggestive of TIA's, no seizures, no headaches, no peripheral neuropathy   Endocrine:  Difficulty with CBG control recently since stopping insulin pump  HEENT:  Edentulous with full dentures,  no recent vision changes  Psych:   no anxiety, no depression    Physical Exam:   BP 136/79  Pulse 86  Resp 16  Ht 5' 3.5" (1.613 m)  Wt 193 lb (87.544 kg)  BMI 33.65 kg/m2  SpO2 92%  General:  Obese  well-appearing  HEENT:  Unremarkable     Neck:   no JVD, no bruits, no adenopathy   Chest:   clear to auscultation, symmetrical breath sounds, no wheezes, no rhonchi   CV:   RRR, grade III/VI systolic murmur   Abdomen:  soft, non-tender, no masses   Extremities:  warm, well-perfused, pulses not palpable, no edema  Rectal/GU  Deferred  Neuro:   Grossly non-focal and symmetrical throughout  Skin:  Clean and dry, no rashes, no breakdown   Diagnostic Tests:  Cardiac Catheterization Operative Report  ANALUCIA HUSH  [id number]  12/27/20129:39 AM  Hemodynamic Findings:  Ao: 153/90 LV: 175/23/28  RA: 13 RV: 65/11/16  PA: 64/33 (mean 44) PCWP: 30  Fick Cardiac Output: 4.64 L/min  Fick Cardiac Index: 2.29 L/min/m2  Central Aortic Saturation: 97%  Pulmonary Artery Saturation: 57%  Aortic Valve: Peak to peak gradient  Mean gradient 15 mmHg  AVA 1.2 cm2  AVA index 0.55  Angiographic Findings:  Left main: No evidence of disease.  Left Anterior Descending Artery: Large vessel that wraps around the apex. No disease. Moderate sized diagonal branch.  Circumflex Artery: Moderate sized vessel with moderate sized OM1. No disease.  Right Coronary Artery: Moderate sized dominant vessel. No disease.  Left Ventricular Angiogram: Not performed.  Impression:  1. No angiographic evidence of CAD  2. Moderate aortic valve stenosis based on cath findings.  3. CHF with volume overload    Transthoracic Echocardiography  Patient: Biana, Haggar MR #: [id number] Study Date: 07/07/2011 ------------------------------------------------------------ LV EF: 30% - 35% ------------------------------------------------------------ Study Conclusions  - Left ventricle: The cavity size was normal. Wall thickness was increased in a pattern of mild LVH. Systolic function was moderately to severely reduced. The estimated ejection fraction was in the range of 30% to 35%. Diffuse hypokinesis. Doppler parameters are consistent with abnormal  left ventricular relaxation (grade 1 diastolic dysfunction). - Aortic valve: Probably trileaflet; severely calcified leaflets. Trivial regurgitation. Mean gradient: 28mm Hg (S). Peak gradient: 43mm Hg (S). AVA 0.73 cm^2. Moderate to severe aortic stenosis. - Mitral valve: Trivial regurgitation. - Left atrium: The atrium was mildly dilated. - Right ventricle: The cavity size was normal. Systolic function was mildly reduced. - Pulmonary arteries: No complete TR doppler jet so unable to estimate PA systolic pressure. - Inferior vena cava: The vessel was normal in size; the respirophasic diameter changes were in the normal range (= 50%); findings are consistent with normal central venous pressure. - Pericardium, extracardiac: A trivial pericardial effusion was identified posterior to the heart. Impressions:  - Normal LV size with mild LV hypertrophy. Global hypokinesis with EF 30-35%. Moderate aortic stenosis by mean gradient, severe visually and by continuity equation. Would be concerned for low-gradient severe AS in setting of low EF. Would consider dobutamine echo to determine true severe AS versus pseudosevere AS. Normal RV size with mildly decreased RV systolic function. ------------------------------------------------------------ Left ventricle: The cavity size was normal. Wall thickness was increased in a pattern of mild LVH. Systolic function was moderately to severely reduced. The estimated ejection fraction was in the range of 30% to 35%. Diffuse hypokinesis. Doppler parameters are consistent with abnormal left ventricular relaxation (grade 1 diastolic dysfunction). ------------------------------------------------------------ Aortic valve: Probably trileaflet; severely calcified leaflets. AVA 0.73 cm^2. Moderate to severe aortic stenosis. Doppler: Trivial regurgitation. Mean gradient: 28mm Hg (S). Peak gradient: 43mm Hg  (S). ------------------------------------------------------------ Aorta: Aortic root: The aortic root was normal in size. Ascending aorta: The ascending aorta was normal in size. ------------------------------------------------------------ Mitral valve: Doppler: There was no evidence for stenosis. Trivial regurgitation. ------------------------------------------------------------ Left atrium: The atrium was mildly dilated. ------------------------------------------------------------ Right ventricle: The cavity size was normal. Systolic function was mildly reduced. ------------------------------------------------------------ Pulmonic valve: Structurally normal valve. Cusp separation was normal. Doppler: Transvalvular velocity was within the normal range. No regurgitation. ------------------------------------------------------------ Tricuspid valve: Doppler: No significant regurgitation. ------------------------------------------------------------ Pulmonary artery: No complete TR doppler jet so unable to estimate PA systolic pressure. ------------------------------------------------------------ Right atrium: The  atrium was normal in size. ------------------------------------------------------------ Pericardium: A trivial pericardial effusion was identified posterior to the heart. ------------------------------------------------------------ Systemic veins: Inferior vena cava: The vessel was normal in size; the respirophasic diameter changes were in the normal range (= 50%); findings are consistent with normal central venous pressure.   Stress Echocardiography  Patient: Glessie, Eustice MR #: [id number] Study Date: 07/26/2011 ------------------------------------------------------------ Study Conclusions  Stress ECG conclusions: Baseline RBBB, no significant change with infusion.  Impressions:  - Baseline mean gradient 30 mmHg/peak gradient 48 mmHg, AVA 0.57 cm^2. 10  mcg/kg/min dobutamine mean gradient49 mmHg/peak gradient 49 mmHg, AVA 0.65 cm^2. 20 mcg/kg/min dobutamine mean gradient 62 mmHg/peak gradient 94 mmHg, AVA 0.91 cm^2.  This suggests true severe low gradient aortic stenosis. Dobutamine. Stress echocardiography. 2D. Height: Height: 162.6cm. Height: 64in. Weight: Weight: 87.1kg. Weight: 191.6lb. Body mass index: BMI: 33kg/m^2. Body surface area: BSA: 1.22m^2. Blood pressure: 127/84. Patient status: Outpatient. -------------------------------------------------------------- Aortic valve: Doppler: VTI ratio of LVOT to aortic valve: 0.27. Indexed valve area: 0.43cm^2/m^2 (VTI). Peak velocity ratio of LVOT to aortic valve: 0.24. Indexed valve area: 0.4cm^2/m^2 (Vmax). Mean gradient: 38mm Hg (S). Peak gradient: 61mm Hg (S).     Impression:  The patient clearly has severe symptomatic aortic stenosis associated with moderate to severe left ventricular dysfunction. There is no coronary artery disease. Risks associated with surgical intervention will be somewhat elevated do to the patient's degree of left ventricular dysfunction and associated comorbid medical problems. However, overall I suspect the patient should tolerate surgery acceptably well and prognosis with continued medical therapy would not be good.   Plan:  The patient was counseled at length regarding surgical alternatives with respect to valve replacement. In particular, discussion was held comparing in contrast and the risks of mechanical valve replacement and the need for lifelong anticoagulation versus use of a bioprosthetic tissue valve and the associated potential for late structural valve deterioration in failure. Other alternatives including the Ross autograft procedure, homograft aortic root replacement, stentless bioprosthetic tissue valve replacement, and valve repair were discussed.  This discussion was placed in the context of the patient's particular circumstances, and as a  result the patient specifically requests that their valve be replaced using a bioprosthetic tissue valve .  They understand and accept all potential associated risks of surgery including but not limited to risk of death, stroke, myocardial infarction, congestive heart failure, respiratory failure, renal failure, pneumonia, bleeding requiring blood transfusion and or reexploration, arrhythmia, heart block or bradycardia requiring permanent pacemaker, pleural effusions or other delayed complications related to continued congestive heart failure, or other late complications related to valve replacement.  We tentatively plan to proceed with surgery on Friday, March 22. The patient is interested in possible minimally invasive approach for surgery. We'll obtain CT angiogram of the heart to assess cardiac morphology to better ascertain the relative risks and benefits of minimally invasive approach for aortic valve replacement. We'll see the patient back for further followup on Monday, March 18 prior to surgery. All of her questions been addressed.     Salvatore Decent. Cornelius Moras, MD 08/10/2011 4:30 PM

## 2011-08-11 ENCOUNTER — Other Ambulatory Visit: Payer: Self-pay | Admitting: Thoracic Surgery (Cardiothoracic Vascular Surgery)

## 2011-08-11 ENCOUNTER — Ambulatory Visit (HOSPITAL_COMMUNITY)
Admission: RE | Admit: 2011-08-11 | Discharge: 2011-08-11 | Disposition: A | Discharge status: Home / Self Care | Payer: Medicare Other | Source: Ambulatory Visit | Attending: Family Medicine | Admitting: Family Medicine

## 2011-08-11 ENCOUNTER — Other Ambulatory Visit: Payer: Self-pay | Admitting: Family Medicine

## 2011-08-11 ENCOUNTER — Other Ambulatory Visit: Payer: Self-pay

## 2011-08-11 DIAGNOSIS — I359 Nonrheumatic aortic valve disorder, unspecified: Secondary | ICD-10-CM

## 2011-08-11 DIAGNOSIS — I350 Nonrheumatic aortic (valve) stenosis: Secondary | ICD-10-CM

## 2011-08-11 DIAGNOSIS — N63 Unspecified lump in unspecified breast: Secondary | ICD-10-CM | POA: Insufficient documentation

## 2011-08-11 DIAGNOSIS — R928 Other abnormal and inconclusive findings on diagnostic imaging of breast: Secondary | ICD-10-CM

## 2011-08-12 ENCOUNTER — Ambulatory Visit: Payer: 59 | Admitting: Cardiology

## 2011-08-16 ENCOUNTER — Other Ambulatory Visit: Payer: Self-pay | Admitting: Thoracic Surgery (Cardiothoracic Vascular Surgery)

## 2011-08-16 DIAGNOSIS — I7409 Other arterial embolism and thrombosis of abdominal aorta: Secondary | ICD-10-CM

## 2011-08-17 ENCOUNTER — Ambulatory Visit (HOSPITAL_COMMUNITY)
Admission: RE | Admit: 2011-08-17 | Discharge: 2011-08-17 | Disposition: A | Discharge status: Home / Self Care | Payer: Medicare Other | Source: Ambulatory Visit | Attending: Thoracic Surgery (Cardiothoracic Vascular Surgery) | Admitting: Thoracic Surgery (Cardiothoracic Vascular Surgery)

## 2011-08-17 ENCOUNTER — Encounter (HOSPITAL_COMMUNITY): Payer: Self-pay

## 2011-08-17 ENCOUNTER — Inpatient Hospital Stay (HOSPITAL_COMMUNITY): Admission: RE | Admit: 2011-08-17 | Payer: Medicare Other | Source: Ambulatory Visit

## 2011-08-17 DIAGNOSIS — I7409 Other arterial embolism and thrombosis of abdominal aorta: Secondary | ICD-10-CM

## 2011-08-17 DIAGNOSIS — I700 Atherosclerosis of aorta: Secondary | ICD-10-CM | POA: Insufficient documentation

## 2011-08-17 DIAGNOSIS — I359 Nonrheumatic aortic valve disorder, unspecified: Secondary | ICD-10-CM

## 2011-08-17 DIAGNOSIS — I350 Nonrheumatic aortic (valve) stenosis: Secondary | ICD-10-CM

## 2011-08-17 MED ORDER — NITROGLYCERIN 0.4 MG SL SUBL
SUBLINGUAL_TABLET | SUBLINGUAL | Status: AC
  Administered 2011-08-17: 0.4 mg
  Filled 2011-08-17: qty 25

## 2011-08-17 MED ORDER — IOHEXOL 350 MG/ML SOLN
70.0000 mL | Freq: Once | INTRAVENOUS | Status: AC | PRN
  Administered 2011-08-17: 70 mL via INTRAVENOUS

## 2011-08-17 MED ORDER — IOHEXOL 350 MG/ML SOLN
80.0000 mL | Freq: Once | INTRAVENOUS | Status: AC | PRN
  Administered 2011-08-17: 80 mL via INTRAVENOUS

## 2011-08-17 MED ORDER — METOPROLOL TARTRATE 1 MG/ML IV SOLN
INTRAVENOUS | Status: AC
  Administered 2011-08-17 (×3): 5 mg
  Filled 2011-08-17: qty 10

## 2011-08-17 NOTE — Procedures (Signed)
[name], Kathryn Morgan               ACCOUNT NO.:  [id number]  MEDICAL RECORD NO.:  [id number]  LOCATION:                                 FACILITY:  PHYSICIAN:  Anahi Belmar L. Juanetta Gosling, M.D.DATE OF BIRTH:  09/30/1940  DATE OF PROCEDURE: DATE OF DISCHARGE:                           PULMONARY FUNCTION TEST   Reason for pulmonary function testing is COPD. 1. Spirometry shows a moderate-to-severe ventilatory defect with     airflow obstruction, mostly in the area of the smaller airways. 2. Lung volume determination not done. 3. DLCO is moderately reduced. 4. There is improvement with inhaled bronchodilator but it does not     reach the level of significance. 5. She may have a separate restrictive change and clinical correlation     is suggested.     Yash Cacciola L. Juanetta Gosling, M.D.     ELH/MEDQ  D:  08/16/2011  T:  08/17/2011  Job:  [id number]

## 2011-08-18 ENCOUNTER — Encounter (HOSPITAL_COMMUNITY): Payer: Medicare Other

## 2011-08-20 ENCOUNTER — Encounter (HOSPITAL_COMMUNITY): Payer: Self-pay | Admitting: Pharmacy Technician

## 2011-08-23 ENCOUNTER — Encounter (HOSPITAL_COMMUNITY): Payer: Self-pay

## 2011-08-23 ENCOUNTER — Other Ambulatory Visit: Payer: Self-pay

## 2011-08-23 ENCOUNTER — Encounter (HOSPITAL_COMMUNITY)
Admission: RE | Admit: 2011-08-23 | Discharge: 2011-08-23 | Disposition: A | Discharge status: Home / Self Care | Payer: Medicare Other | Source: Ambulatory Visit | Attending: Thoracic Surgery (Cardiothoracic Vascular Surgery) | Admitting: Thoracic Surgery (Cardiothoracic Vascular Surgery)

## 2011-08-23 ENCOUNTER — Encounter: Payer: Self-pay | Admitting: Thoracic Surgery (Cardiothoracic Vascular Surgery)

## 2011-08-23 ENCOUNTER — Ambulatory Visit (HOSPITAL_COMMUNITY)
Admission: RE | Admit: 2011-08-23 | Discharge: 2011-08-23 | Disposition: A | Discharge status: Home / Self Care | Payer: Medicare Other | Source: Ambulatory Visit | Attending: Thoracic Surgery (Cardiothoracic Vascular Surgery) | Admitting: Thoracic Surgery (Cardiothoracic Vascular Surgery)

## 2011-08-23 ENCOUNTER — Ambulatory Visit (INDEPENDENT_AMBULATORY_CARE_PROVIDER_SITE_OTHER): Payer: Medicare Other | Admitting: Thoracic Surgery (Cardiothoracic Vascular Surgery)

## 2011-08-23 VITALS — BP 132/74 | HR 81 | Resp 20 | Ht 63.5 in | Wt 198.0 lb

## 2011-08-23 VITALS — BP 116/81 | HR 77 | Temp 97.0°F | Resp 20 | Ht 64.0 in | Wt 196.7 lb

## 2011-08-23 DIAGNOSIS — Z01810 Encounter for preprocedural cardiovascular examination: Secondary | ICD-10-CM | POA: Insufficient documentation

## 2011-08-23 DIAGNOSIS — I5022 Chronic systolic (congestive) heart failure: Secondary | ICD-10-CM

## 2011-08-23 DIAGNOSIS — I359 Nonrheumatic aortic valve disorder, unspecified: Secondary | ICD-10-CM

## 2011-08-23 DIAGNOSIS — I350 Nonrheumatic aortic (valve) stenosis: Secondary | ICD-10-CM | POA: Insufficient documentation

## 2011-08-23 DIAGNOSIS — I6529 Occlusion and stenosis of unspecified carotid artery: Secondary | ICD-10-CM | POA: Insufficient documentation

## 2011-08-23 DIAGNOSIS — Z01818 Encounter for other preprocedural examination: Secondary | ICD-10-CM | POA: Insufficient documentation

## 2011-08-23 DIAGNOSIS — Z01812 Encounter for preprocedural laboratory examination: Secondary | ICD-10-CM | POA: Insufficient documentation

## 2011-08-23 HISTORY — DX: Chronic kidney disease, unspecified: N18.9

## 2011-08-23 HISTORY — DX: Chronic obstructive pulmonary disease, unspecified: J44.9

## 2011-08-23 HISTORY — DX: Heart failure, unspecified: I50.9

## 2011-08-23 LAB — URINE MICROSCOPIC-ADD ON

## 2011-08-23 LAB — BLOOD GAS, ARTERIAL
Acid-Base Excess: 3.6 mmol/L — ABNORMAL HIGH (ref 0.0–2.0)
Bicarbonate: 27.8 mEq/L — ABNORMAL HIGH (ref 20.0–24.0)
O2 Saturation: 94.9 %
Patient temperature: 98.6
TCO2: 29.1 mmol/L (ref 0–100)

## 2011-08-23 LAB — PROTIME-INR: Prothrombin Time: 12.3 seconds (ref 11.6–15.2)

## 2011-08-23 LAB — SURGICAL PCR SCREEN
MRSA, PCR: NEGATIVE
Staphylococcus aureus: NEGATIVE

## 2011-08-23 LAB — COMPREHENSIVE METABOLIC PANEL
Alkaline Phosphatase: 76 U/L (ref 39–117)
BUN: 28 mg/dL — ABNORMAL HIGH (ref 6–23)
Creatinine, Ser: 0.85 mg/dL (ref 0.50–1.10)
GFR calc Af Amer: 79 mL/min — ABNORMAL LOW (ref >90)
Glucose, Bld: 180 mg/dL — ABNORMAL HIGH (ref 70–99)
Potassium: 4 mEq/L (ref 3.5–5.1)
Total Protein: 7.4 g/dL (ref 6.0–8.3)

## 2011-08-23 LAB — URINALYSIS, ROUTINE W REFLEX MICROSCOPIC
Glucose, UA: NEGATIVE mg/dL
Ketones, ur: NEGATIVE mg/dL
Nitrite: NEGATIVE
Specific Gravity, Urine: 1.011 (ref 1.005–1.030)
pH: 5.5 (ref 5.0–8.0)

## 2011-08-23 LAB — CBC
HCT: 38.9 % (ref 36.0–46.0)
Hemoglobin: 12.8 g/dL (ref 12.0–15.0)
MCH: 26 pg (ref 26.0–34.0)
MCHC: 32.9 g/dL (ref 30.0–36.0)
MCV: 78.9 fL (ref 78.0–100.0)

## 2011-08-23 LAB — PULMONARY FUNCTION TEST

## 2011-08-23 MED ORDER — ALBUTEROL SULFATE (5 MG/ML) 0.5% IN NEBU
2.5000 mg | INHALATION_SOLUTION | Freq: Once | RESPIRATORY_TRACT | Status: AC
  Administered 2011-08-23: 2.5 mg via RESPIRATORY_TRACT

## 2011-08-23 NOTE — Pre-Procedure Instructions (Signed)
20 Kathryn Morgan  08/23/2011   Your procedure is scheduled on:  Friday  08/27/11    Report to [facility] at 530 AM.  Call this number if you have problems the morning of surgery: [phone]   Remember:   Do not eat food:After Midnight.  May have clear liquids: up to 4 Hours before arrival.  Clear liquids include soda, tea, black coffee, apple or grape juice, broth.  Take these medicines the morning of surgery with A SIP OF WATER:  COREG (CARVEDILOL), ZYRTEC, ADVAIR, PROTONIX, SPIRIVA, NASAL SPRAY    Do not wear jewelry, make-up or nail polish.  Do not wear lotions, powders, or perfumes. You may wear deodorant.  Do not shave 48 hours prior to surgery.  Do not bring valuables to the hospital.  Contacts, dentures or bridgework may not be worn into surgery.  Leave suitcase in the car. After surgery it may be brought to your room.  For patients admitted to the hospital, checkout time is 11:00 AM the day of discharge.   Patients discharged the day of surgery will not be allowed to drive home.  Name and phone number of your driver:   Special Instructions: CHG Shower Use Special Wash: 1/2 bottle night before surgery and 1/2 bottle morning of surgery.   Please read over the following fact sheets that you were given: Pain Booklet, Open Heart Packet, MRSA Information and Surgical Site Infection Prevention

## 2011-08-23 NOTE — Progress Notes (Signed)
[address]            [name] [address]          [phone]     CARDIOTHORACIC SURGERY OFFICE NOTE  Referring Provider is Jonelle Sidle, MD PCP is [name], MD, MD   HPI:  Patient returns for further followup of severe aortic stenosis with tentatively plans to proceed with elective aortic valve replacement later this week.  Since she was last seen here in the office she reports no new problems or complaints.   Current Outpatient Prescriptions  Medication Sig Dispense Refill  . alendronate (FOSAMAX) 35 MG tablet Take 70 mg by mouth every 7 (seven) days.       . carvedilol (COREG) 12.5 MG tablet Take 1 tablet (12.5 mg total) by mouth 2 (two) times daily with a meal.  60 tablet  6  . cetirizine (ZYRTEC) 10 MG tablet Take 10 mg by mouth daily.      Marland Kitchen docusate sodium (COLACE) 100 MG capsule Take 100 mg by mouth 2 (two) times daily.        . Fluticasone-Salmeterol (ADVAIR DISKUS) 250-50 MCG/DOSE AEPB Inhale 1 puff into the lungs every 12 (twelve) hours.        . furosemide (LASIX) 20 MG tablet Take 40 mg by mouth daily.      Marland Kitchen guaiFENesin (MUCINEX) 600 MG 12 hr tablet Take 1,200 mg by mouth 2 (two) times daily.      Marland Kitchen HUMULIN R 100 UNIT/ML injection       . insulin detemir (LEVEMIR) 100 UNIT/ML injection Inject 44 Units into the skin at bedtime.       . Insulin Lispro, Human, (HUMALOG KWIKPEN [place]) Inject 10-16 Units into the skin 3 (three) times daily with meals. Per sliding scale      . Multiple Vitamin (MULITIVITAMIN WITH MINERALS) TABS Take 1 tablet by mouth daily.      . pantoprazole (PROTONIX) 40 MG tablet Take 1 tablet (40 mg total) by mouth 2 (two) times daily before a meal.  60 tablet  3  . Probiotic Product (PROBIOTIC FORMULA PO) Take 1 tablet by mouth daily.      . simvastatin (ZOCOR) 20 MG tablet Take 20 mg by mouth at bedtime.        . sodium chloride (OCEAN) 0.65 % nasal spray Place 1 spray into the nose daily as needed. For  dryness      . spironolactone (ALDACTONE) 25 MG tablet Take 12.5 mg by mouth daily.      Marland Kitchen tiotropium (SPIRIVA) 18 MCG inhalation capsule Place 18 mcg into inhaler and inhale daily.        . Vitamin D, Ergocalciferol, (DRISDOL) 50000 UNITS CAPS Take 50,000 Units by mouth every 7 (seven) days. For 12 weeks       No current facility-administered medications for this visit.   Facility-Administered Medications Ordered in Other Visits  Medication Dose Route Frequency Provider Last Rate Last Dose  . albuterol (PROVENTIL) (5 MG/ML) 0.5% nebulizer solution 2.5 mg  2.5 mg Nebulization Once Purcell Nails, MD   2.5 mg at 08/23/11 2841      Physical Exam:   BP 132/74  Pulse 81  Resp 20  Ht 5' 3.5" (1.613 m)  Wt 198 lb (89.812 kg)  BMI 34.52 kg/m2  SpO2 91%  General:  Obese but otherwise well-appearing  Chest:   Clear to auscultation  CV:   Regular rate and rhythm with prominent systolic murmur  Incisions:  n/a  Abdomen:  Soft and nontender  Extremities:  Warm and well-perfused  Diagnostic Tests:  CARDIAC CT CHEST  The following report is an over-read performed by radiologist Dr.  Florencia Reasons, M.D. of [facility] on 08/17/2011  17:58:18. This over-read does not include interpretation of  cardiac or coronary anatomy or pathology. The CTA interpretation  by the cardiologist is attached.  Comparison: No priors.  Findings:  Mediastinum: Cardiac findings will be dictated separately. Within  the visualized portions of the thorax, there are no pathologically  enlarged mediastinal or hilar lymph nodes identified. Esophagus is  unremarkable in appearance. No abnormal soft tissue masses within  the anterior mediastinum. Mild heterogeneity of the thyroid gland  (nonspecific) without a focal dominant nodule.  Lungs/Pleura: Linear opacities seen in the dependent portion of the  left lower lobe, favored to represent areas of subsegmental  atelectasis and/or scarring. Similar  findings are present to a  lesser degree in the visualized right lower lobe. Within the  visualized portions of the thorax, there are no areas of airspace  consolidation and no suspicious appearing pulmonary nodules or  masses.  Upper Abdomen: Unremarkable.  Musculoskeletal: There are no aggressive appearing lytic or blastic  lesions noted in the visualized portions of the skeleton.  IMPRESSION:  1. Probable areas of scarring and/or subsegmental atelectasis in  the lower lobes of the lungs bilaterally.  2. No concerning unexpected extracardiac findings within the  visualized thorax.  Cardiac CT Morphology:  ICoronary Arteries:  Right dominant with no anomalies. Dense calcification at ostium of  LM and RCA. Scattered calcification of the proximal LAD  LM- normal  LAD- normal large vessel wraps the apex  D1: large vessel normal  D2: normal  Circumflex: normal  OM1: large vessel and normal  RCA: Dominant and normal Two large RV branches normal  PDA-normal  PLA-normal  Biplane EF: EF 38% diffuse hypokinesis. ( EDV 220cc, ESV 137cc,  SV 83cc)  Aortic Valve: Trileaflet with fused commisures between the  noncoronary cusp and left and right cusps. Primary systolic  opening is only between the right and left cusps. Moderate  calcification at leaflet base but mild at tips.  Sinus of Valsalva Measurements:  Noncoronary: 31 mm  Right coronary: 28.3 mm  Left coronary: 28.0 mm  Coronary Distance to Virtual basal plane:  LM: 17.5 mm  RCA: 13.6 mm  Virtual Basal Plane:  Max/Min: 27.70mm/22.2 mm Average= 25.25mm  Area: 572 sqmm calculated diameter = 27 mm  Perimeter: 93.4 mm calculated diameter = 29 mm  Aorta: No significant aneurysm or dilatation Calcification of the  innominate ostia and moderate calcification of the arch Normal arch  vessels  Sinus of Valsalva: 33.2 mm  Sinotubular Junction: 26.5 mm  4cm above valve: 28.7 mm  Ascending Ao at RPA: 29mm  Valve Location:  The valve is  just to the right of the sternoxiphoid junction at the  right fifth intercostals space. It should be accessible from a  right mini-thoracotomy through the 2/3rd intercostals space.  Impression  1) Right dominant normal coronary arteries  2) Calcified trileaflet AV with motion only between right and  left commisure. Should be approachable through right mini-  thoracotomy  3) Normal ascending aortic root see body of report for  measurements Moderately calcified aortic arch  4) Moderate LVE with moderately reduced EF 38% diffuse  hypokinesis   Results for ACASIA, SKILTON (MRN [id number]) as of 08/23/2011 15:05  Ref. Range 08/23/2011 11:26  Sodium Latest Range: 135-145 mEq/L 139  Potassium Latest Range: 3.5-5.1 mEq/L 4.0  Chloride Latest Range: 96-112 mEq/L 99  CO2 Latest Range: 19-32 mEq/L 30  BUN Latest Range: 6-23 mg/dL 28 (H)  Creat Latest Range: 0.50-1.10 mg/dL 4.09  Calcium Latest Range: 8.4-10.5 mg/dL 9.6  GFR calc non Af Amer Latest Range: >90 mL/min 68 (L)  GFR calc Af Amer Latest Range: >90 mL/min 79 (L)  Glucose Latest Range: 70-99 mg/dL 811 (H)  Alkaline Phosphatase Latest Range: 39-117 U/L 76  Albumin Latest Range: 3.5-5.2 g/dL 3.7  AST Latest Range: 0-37 U/L 17  ALT Latest Range: 0-35 U/L 15  Total Protein Latest Range: 6.0-8.3 g/dL 7.4  Total Bilirubin Latest Range: 0.3-1.2 mg/dL 0.5  WBC Latest Range: 4.0-10.5 K/uL 8.6  RBC Latest Range: 3.87-5.11 MIL/uL 4.93  HGB Latest Range: 12.0-15.0 g/dL 91.4  HCT Latest Range: 36.0-46.0 % 38.9  MCV Latest Range: 78.0-100.0 fL 78.9  MCH Latest Range: 26.0-34.0 pg 26.0  MCHC Latest Range: 30.0-36.0 g/dL 78.2  RDW Latest Range: 11.5-15.5 % 21.2 (H)  Platelets Latest Range: 150-400 K/uL 203  Prothrombin Time Latest Range: 11.6-15.2 seconds 12.3  INR Latest Range: 0.00-1.49  0.90  aPTT Latest Range: 24-37 seconds 30   Results for JOYCELINE, MAIORINO (MRN [id number]) as of 08/23/2011 15:05  Ref. Range 08/23/2011 11:25  Sample type No  range found ARTERIAL DRAW  FIO2 No range found 0.21  pH, Arterial Latest Range: 7.350-7.400  7.425 (H)  pCO2 arterial Latest Range: 35.0-45.0 mmHg 43.1  pO2, Arterial Latest Range: 80.0-100.0 mmHg 73.3 (L)  Bicarbonate Latest Range: 20.0-24.0 mEq/L 27.8 (H)  TCO2 Latest Range: 0-100 mmol/L 29.1  Acid-Base Excess Latest Range: 0.0-2.0 mmol/L 3.6 (H)  O2 Saturation No range found 94.9  Patient temperature No range found 98.6    PULMONARY FUNCTION TESTS: FEV1 1.07L (48%) FVC 1.52L (52%) FEF25-75 0.72L (38%) DLCO 53%  Impression:  Severe symptomatic aortic stenosis with moderate-severe left ventricular dysfunction.  The patient has numerous comorbid medical problems that we'll increase the risks of surgery. Upon review of cardiac CT angiogram, I am concerned that the mini-thoracotomy approach would be problematic given the patient's morbid obesity and subsequent effects on cardiac position within the chest. Given this and the severity of left ventricular dysfunction, I favor partial sternotomy or conventional sternotomy as a more conservative approach.  The patient also has underlying severe chronic lung disease.  Plan:  I have again reviewed the indications, risks, and potential benefits of surgery with the patient and her husband here in the office today. We plan to proceed with aortic valve replacement later this week as originally scheduled. All of their questions been addressed.   Salvatore Decent. Cornelius Moras, MD 08/23/2011 3:03 PM

## 2011-08-23 NOTE — Progress Notes (Signed)
Pre-op Cardiac Surgery  Carotid Findings:  No ICA stenosis.  Vertebral artery flow is antegrade.  Upper Extremity Right Left  Brachial Pressures 158T 150T  Radial Waveforms T T  Ulnar Waveforms T T  Palmar Arch (Allen's Test) WNL Doppler signal remains normal with radial compression and obliterates with ulnar compression.   Findings:      Lower  Extremity Right Left  Dorsalis Pedis    Anterior Tibial    Posterior Tibial    Ankle/Brachial Indices      Findings:

## 2011-08-23 NOTE — H&P (Signed)
CARDIOTHORACIC SURGERY HISTORY AND PHYSICAL EXAM  PCP is [name], MD, MD Referring Provider is MCDOWELL, Illene Bolus, MD    Chief Complaint   Patient presents with   .  Shortness of Breath       with exertion.......ECHO....aortic stenosis     HPI:  Patient is a 71 year old married white female from [place], [place] with long-standing history of heart murmur and history of rheumatic fever as a child who was recently discovered to have severe symptomatic aortic stenosis.  The patient describes no history of heart murmur dating back to her childhood. Over the last 3 years she has developed progressive symptoms of exertional shortness of breath. This false symptoms progressed somewhat dramatically until December when she was hospitalized with class IV congestive heart failure.  Echocardiograms performed at that time demonstrated severe aortic stenosis with moderate to severe left ventricular dysfunction. Left and right heart catheterization was performed demonstrating normal coronary artery anatomy with no significant coronary artery disease. Gradients measured the time of catheterization suggested that the patient's aortic stenosis was perhaps moderate in severity and the patient was treated medically. She has been seen in followup and repeat transthoracic echocardiogram suggested severe aortic stenosis.  Dobutamine stress echocardiogram was performed confirming the presence of severe aortic stenosis with increased gradient during stress. The patient has been referred to consider elective aortic valve replacement.   Past Medical History  Diagnosis Date  . Type 2 diabetes mellitus     Insulin pump  . Essential hypertension, benign   . Dyslipidemia   . Depression   . Necrotizing pancreatitis   . Neuropathy   . Rhabdomyolysis   . Gallstone pancreatitis     2005  . Chronic systolic heart failure     LVEF 35%  . Aortic stenosis     Moderate to severe  .  Respiratory failure     Home O2 ordered 05/2011  . Right bundle-branch block   . Rheumatic fever   . Obesity (BMI 30-39.9) 08/10/2011  . Osteoporosis 08/10/2011  . GERD (gastroesophageal reflux disease)   . Allergy   . CHF (congestive heart failure)   . Heart murmur     AORTIC STENOSIS  . COPD (chronic obstructive pulmonary disease)   . Shortness of breath     WITH EXERTION, DRY COUGH   . Diabetes mellitus   . Blood dyscrasia     VIT D DEF  . Hepatitis     2005 OR 06  ? HEP C    . Blood transfusion   . Chronic kidney disease     KIDNEYS WORKING 50 PERCENT   . Arthritis     PRE OP RT LEG    Past Surgical History  Procedure Date  . Esophagogastroduodenoscopy 04/21/04    Normal esophagus/normal D1,D2; attempted  transgastric drainage/stent placement of pancreatic pseudocyst, no obvious location for gastrotomy, therefore transferred to [facility]  . Debridement pancreas   . Pancreatic pseudocyst drainage   . Cholecystectomy   . Cardiac catheterization     DR MCDOWELL     Family History  Problem Relation Age of Onset  . Colon cancer Neg Hx   . Heart disease      CHF    Social History History  Substance Use Topics  . Smoking status: Former Smoker -- 3.0 packs/day for 15 years    Types: Cigarettes    Quit date: 06/09/1981  . Smokeless tobacco: Not on file  Comment: quit about 20+ yrs ago  . Alcohol Use: No    No current facility-administered medications for this encounter.   Current Outpatient Prescriptions  Medication Sig Dispense Refill  . carvedilol (COREG) 12.5 MG tablet Take 1 tablet (12.5 mg total) by mouth 2 (two) times daily with a meal.  60 tablet  6  . cetirizine (ZYRTEC) 10 MG tablet Take 10 mg by mouth daily.      Marland Kitchen docusate sodium (COLACE) 100 MG capsule Take 100 mg by mouth 2 (two) times daily.        . Fluticasone-Salmeterol (ADVAIR DISKUS) 250-50 MCG/DOSE AEPB Inhale 1 puff into the lungs every 12 (twelve) hours.        . furosemide (LASIX) 20 MG  tablet Take 40 mg by mouth daily.      Marland Kitchen guaiFENesin (MUCINEX) 600 MG 12 hr tablet Take 1,200 mg by mouth 2 (two) times daily.      . insulin detemir (LEVEMIR) 100 UNIT/ML injection Inject 44 Units into the skin at bedtime.       . Insulin Lispro, Human, (HUMALOG KWIKPEN [place]) Inject 10-16 Units into the skin 3 (three) times daily with meals. Per sliding scale      . Multiple Vitamin (MULITIVITAMIN WITH MINERALS) TABS Take 1 tablet by mouth daily.      . pantoprazole (PROTONIX) 40 MG tablet Take 1 tablet (40 mg total) by mouth 2 (two) times daily before a meal.  60 tablet  3  . Probiotic Product (PROBIOTIC FORMULA PO) Take 1 tablet by mouth daily.      . simvastatin (ZOCOR) 20 MG tablet Take 20 mg by mouth at bedtime.        . sodium chloride (OCEAN) 0.65 % nasal spray Place 1 spray into the nose daily as needed. For dryness      . spironolactone (ALDACTONE) 25 MG tablet Take 12.5 mg by mouth daily.      Marland Kitchen tiotropium (SPIRIVA) 18 MCG inhalation capsule Place 18 mcg into inhaler and inhale daily.        Marland Kitchen alendronate (FOSAMAX) 35 MG tablet Take 70 mg by mouth every 7 (seven) days.       Marland Kitchen HUMULIN R 100 UNIT/ML injection       . Vitamin D, Ergocalciferol, (DRISDOL) 50000 UNITS CAPS Take 50,000 Units by mouth every 7 (seven) days. For 12 weeks       Facility-Administered Medications Ordered in Other Encounters  Medication Dose Route Frequency Provider Last Rate Last Dose  . albuterol (PROVENTIL) (5 MG/ML) 0.5% nebulizer solution 2.5 mg  2.5 mg Nebulization Once Purcell Nails, MD   2.5 mg at 08/23/11 6045    Allergies  Allergen Reactions  . Ace Inhibitors Cough  . Codeine Other (See Comments)    Does not know  . Levaquin Itching  . Phenergan    Review of Systems:             General:                      normal appetite, decreased energy               Respiratory:                no cough, no wheezing, no hemoptysis, no pain with inspiration or cough, + exertional shortness of breath                Cardiac:  no chest pain or tightness, + exertional SOB, no resting SOB at present but resting SOB present at time of hospitalization in December, + PND in December, + orthopnea, + LE edema in December, none presently, no palpitations, no syncope             GI:                                no difficulty swallowing, + reflux, no hematochezia, no hematemesis, no melena, no constipation, no diarrhea             GU:                              no dysuria, no urgency, no frequency               Musculoskeletal:         no arthritis, no arthralgia except some low back pain             Vascular:                     no pain suggestive of claudication               Neuro:                         no symptoms suggestive of TIA's, no seizures, no headaches, no peripheral neuropathy               Endocrine:                   Difficulty with CBG control recently since stopping insulin pump             HEENT:                       Edentulous with full dentures,  no recent vision changes             Psych:                         no anxiety, no depression                Physical Exam:              BP 136/79  Pulse 86  Resp 16  Ht 5' 3.5" (1.613 m)  Wt 193 lb (87.544 kg)  BMI 33.65 kg/m2  SpO2 92%             General:                      Obese  well-appearing             HEENT:                       Unremarkable               Neck:                           no JVD, no bruits, no adenopathy               Chest:  clear to auscultation, symmetrical breath sounds, no wheezes, no rhonchi               CV:                              RRR, grade III/VI systolic murmur               Abdomen:                    soft, non-tender, no masses               Extremities:                 warm, well-perfused, pulses not palpable, no edema             Rectal/GU                   Deferred             Neuro:                         Grossly non-focal and symmetrical  throughout             Skin:                            Clean and dry, no rashes, no breakdown   Diagnostic Tests:  Cardiac Catheterization Operative Report   Kathryn Morgan   [id number]   12/27/20129:39 AM   Hemodynamic Findings:   Ao: 153/90 LV: 175/23/28   RA: 13 RV: 65/11/16   PA: 64/33 (mean 44) PCWP: 30   Fick Cardiac Output: 4.64 L/min   Fick Cardiac Index: 2.29 L/min/m2   Central Aortic Saturation: 97%   Pulmonary Artery Saturation: 57%   Aortic Valve: Peak to peak gradient   Mean gradient 15 mmHg   AVA 1.2 cm2   AVA index 0.55   Angiographic Findings:   Left main: No evidence of disease.   Left Anterior Descending Artery: Large vessel that wraps around the apex. No disease. Moderate sized diagonal branch.   Circumflex Artery: Moderate sized vessel with moderate sized OM1. No disease.   Right Coronary Artery: Moderate sized dominant vessel. No disease.   Left Ventricular Angiogram: Not performed.   Impression:  1. No angiographic evidence of CAD   2. Moderate aortic valve stenosis based on cath findings.   3. CHF with volume overload     Transthoracic Echocardiography  Patient: Kathryn Morgan, Kathryn Morgan MR #: [id number] Study Date: 07/07/2011 ------------------------------------------------------------ LV EF: 30% - 35% ------------------------------------------------------------ Study Conclusions  - Left ventricle: The cavity size was normal. Wall thickness was increased in a pattern of mild LVH. Systolic function was moderately to severely reduced. The estimated ejection fraction was in the range of 30% to 35%. Diffuse hypokinesis. Doppler parameters are consistent with abnormal left ventricular relaxation (grade 1 diastolic dysfunction). - Aortic valve: Probably trileaflet; severely calcified leaflets. Trivial regurgitation. Mean gradient: 28mm Hg (S). Peak gradient: 43mm Hg (S). AVA 0.73 cm^2. Moderate to severe aortic stenosis. - Mitral valve: Trivial  regurgitation. - Left atrium: The atrium was mildly dilated. - Right ventricle: The cavity size was normal. Systolic function was mildly reduced. - Pulmonary arteries: No complete TR doppler jet so unable to estimate PA systolic pressure. - Inferior vena cava: The vessel was normal  in size; the respirophasic diameter changes were in the normal range (= 50%); findings are consistent with normal central venous pressure. - Pericardium, extracardiac: A trivial pericardial effusion was identified posterior to the heart. Impressions:  - Normal LV size with mild LV hypertrophy. Global hypokinesis with EF 30-35%. Moderate aortic stenosis by mean gradient, severe visually and by continuity equation. Would be concerned for low-gradient severe AS in setting of low EF. Would consider dobutamine echo to determine true severe AS versus pseudosevere AS. Normal RV size with mildly decreased RV systolic function. ------------------------------------------------------------ Left ventricle: The cavity size was normal. Wall thickness was increased in a pattern of mild LVH. Systolic function was moderately to severely reduced. The estimated ejection fraction was in the range of 30% to 35%. Diffuse hypokinesis. Doppler parameters are consistent with abnormal left ventricular relaxation (grade 1 diastolic dysfunction). ------------------------------------------------------------ Aortic valve: Probably trileaflet; severely calcified leaflets. AVA 0.73 cm^2. Moderate to severe aortic stenosis. Doppler: Trivial regurgitation. Mean gradient: 28mm Hg (S). Peak gradient: 43mm Hg (S). ------------------------------------------------------------ Aorta: Aortic root: The aortic root was normal in size. Ascending aorta: The ascending aorta was normal in size. ------------------------------------------------------------ Mitral valve: Doppler: There was no evidence for stenosis. Trivial  regurgitation. ------------------------------------------------------------ Left atrium: The atrium was mildly dilated. ------------------------------------------------------------ Right ventricle: The cavity size was normal. Systolic function was mildly reduced. ------------------------------------------------------------ Pulmonic valve: Structurally normal valve. Cusp separation was normal. Doppler: Transvalvular velocity was within the normal range. No regurgitation. ------------------------------------------------------------ Tricuspid valve: Doppler: No significant regurgitation. ------------------------------------------------------------ Pulmonary artery: No complete TR doppler jet so unable to estimate PA systolic pressure. ------------------------------------------------------------ Right atrium: The atrium was normal in size. ------------------------------------------------------------ Pericardium: A trivial pericardial effusion was identified posterior to the heart. ------------------------------------------------------------ Systemic veins: Inferior vena cava: The vessel was normal in size; the respirophasic diameter changes were in the normal range (= 50%); findings are consistent with normal central venous pressure.   Stress Echocardiography  Patient: Kathryn Morgan, Kathryn Morgan MR #: [id number] Study Date: 07/26/2011 ------------------------------------------------------------ Study Conclusions  Stress ECG conclusions: Baseline RBBB, no significant change with infusion.  Impressions:  - Baseline mean gradient 30 mmHg/peak gradient 48 mmHg, AVA 0.57 cm^2. 10 mcg/kg/min dobutamine mean gradient49 mmHg/peak gradient 49 mmHg, AVA 0.65 cm^2. 20 mcg/kg/min dobutamine mean gradient 62 mmHg/peak gradient 94 mmHg, AVA 0.91 cm^2.  This suggests true severe low gradient aortic stenosis. Dobutamine. Stress echocardiography. 2D. Height: Height: 162.6cm. Height: 64in. Weight:  Weight: 87.1kg. Weight: 191.6lb. Body mass index: BMI: 33kg/m^2. Body surface area: BSA: 1.6m^2. Blood pressure: 127/84. Patient status: Outpatient. -------------------------------------------------------------- Aortic valve: Doppler: VTI ratio of LVOT to aortic valve: 0.27. Indexed valve area: 0.43cm^2/m^2 (VTI). Peak velocity ratio of LVOT to aortic valve: 0.24. Indexed valve area: 0.4cm^2/m^2 (Vmax). Mean gradient: 38mm Hg (S). Peak gradient: 61mm Hg (S).  CARDIAC CT CHEST  The following report is an over-read performed by radiologist Dr.   Florencia Reasons, M.D. of [facility] on 08/17/2011   17:58:18. This over-read does not include interpretation of   cardiac or coronary anatomy or pathology. The CTA interpretation   by the cardiologist is attached.   Comparison: No priors.   Findings:   Mediastinum: Cardiac findings will be dictated separately. Within   the visualized portions of the thorax, there are no pathologically   enlarged mediastinal or hilar lymph nodes identified. Esophagus is   unremarkable in appearance. No abnormal soft tissue masses within   the anterior mediastinum. Mild heterogeneity of the thyroid gland   (nonspecific) without a focal dominant nodule.  Lungs/Pleura: Linear opacities seen in the dependent portion of the   left lower lobe, favored to represent areas of subsegmental   atelectasis and/or scarring. Similar findings are present to a   lesser degree in the visualized right lower lobe. Within the   visualized portions of the thorax, there are no areas of airspace   consolidation and no suspicious appearing pulmonary nodules or   masses.   Upper Abdomen: Unremarkable.   Musculoskeletal: There are no aggressive appearing lytic or blastic   lesions noted in the visualized portions of the skeleton.   IMPRESSION:   1. Probable areas of scarring and/or subsegmental atelectasis in   the lower lobes of the lungs bilaterally.   2. No  concerning unexpected extracardiac findings within the   visualized thorax.   Cardiac CT Morphology:   ICoronary Arteries:   Right dominant with no anomalies. Dense calcification at ostium of   LM and RCA. Scattered calcification of the proximal LAD   LM- normal   LAD- normal large vessel wraps the apex   D1: large vessel normal   D2: normal   Circumflex: normal   OM1: large vessel and normal   RCA: Dominant and normal Two large RV branches normal   PDA-normal   PLA-normal   Biplane EF: EF 38% diffuse hypokinesis. ( EDV 220cc, ESV 137cc,   SV 83cc)   Aortic Valve: Trileaflet with fused commisures between the   noncoronary cusp and left and right cusps. Primary systolic   opening is only between the right and left cusps. Moderate   calcification at leaflet base but mild at tips.   Sinus of Valsalva Measurements:   Noncoronary: 31 mm   Right coronary: 28.3 mm   Left coronary: 28.0 mm   Coronary Distance to Virtual basal plane:   LM: 17.5 mm   RCA: 13.6 mm   Virtual Basal Plane:   Max/Min: 27.11mm/22.2 mm Average= 25.63mm   Area: 572 sqmm calculated diameter = 27 mm   Perimeter: 93.4 mm calculated diameter = 29 mm   Aorta: No significant aneurysm or dilatation Calcification of the   innominate ostia and moderate calcification of the arch Normal arch   vessels   Sinus of Valsalva: 33.2 mm   Sinotubular Junction: 26.5 mm   4cm above valve: 28.7 mm   Ascending Ao at RPA: 29mm   Valve Location:   The valve is just to the right of the sternoxiphoid junction at the   right fifth intercostals space. It should be accessible from a   right mini-thoracotomy through the 2/3rd intercostals space.   Impression   1) Right dominant normal coronary arteries   2) Calcified trileaflet AV with motion only between right and   left commisure. Should be approachable through right mini-   thoracotomy   3) Normal ascending aortic root see body of report for   measurements Moderately calcified  aortic arch   4) Moderate LVE with moderately reduced EF 38% diffuse   hypokinesis    Impression:  The patient clearly has severe symptomatic aortic stenosis associated with moderate to severe left ventricular dysfunction. There is no coronary artery disease. Risks associated with surgical intervention will be somewhat elevated do to the patient's degree of left ventricular dysfunction and associated comorbid medical problems. However, overall I suspect the patient should tolerate surgery acceptably well and prognosis with continued medical therapy would not be good.   Upon review of cardiac CT angiogram, I am concerned that the mini-thoracotomy approach  would be problematic given the patient's morbid obesity and subsequent effects on cardiac position within the chest. Given this and the severity of left ventricular dysfunction, I favor partial sternotomy or conventional sternotomy as a more conservative approach.  The patient also has underlying severe chronic lung disease.   Plan:  The patient was counseled at length regarding surgical alternatives with respect to valve replacement. In particular, discussion was held comparing in contrast and the risks of mechanical valve replacement and the need for lifelong anticoagulation versus use of a bioprosthetic tissue valve and the associated potential for late structural valve deterioration in failure. Other alternatives including the Ross autograft procedure, homograft aortic root replacement, stentless bioprosthetic tissue valve replacement, and valve repair were discussed.  This discussion was placed in the context of the patient's particular circumstances, and as a result the patient specifically requests that their valve be replaced using a bioprosthetic tissue valve .  They understand and accept all potential associated risks of surgery including but not limited to risk of death, stroke, myocardial infarction, congestive heart failure, respiratory  failure, renal failure, pneumonia, bleeding requiring blood transfusion and or reexploration, arrhythmia, heart block or bradycardia requiring permanent pacemaker, pleural effusions or other delayed complications related to continued congestive heart failure, or other late complications related to valve replacement.  We tentatively plan to proceed with surgery on Friday, March 22. All of her questions been addressed.      Salvatore Decent. Cornelius Moras, MD

## 2011-08-24 LAB — HEMOGLOBIN A1C: Mean Plasma Glucose: 192 mg/dL — ABNORMAL HIGH (ref <117)

## 2011-08-26 MED ORDER — NITROGLYCERIN 5 MG/ML IV SOLN
INTRAVENOUS | Status: DC
  Filled 2011-08-26: qty 2.5

## 2011-08-26 MED ORDER — SODIUM CHLORIDE 0.9 % IV SOLN
INTRAVENOUS | Status: AC
  Administered 2011-08-27: 4.3 [IU]/h via INTRAVENOUS
  Filled 2011-08-26: qty 1

## 2011-08-26 MED ORDER — DEXTROSE 5 % IV SOLN
1.5000 g | INTRAVENOUS | Status: AC
  Administered 2011-08-27: 1.5 g via INTRAVENOUS
  Administered 2011-08-27: .75 g via INTRAVENOUS
  Filled 2011-08-26: qty 1.5

## 2011-08-26 MED ORDER — POTASSIUM CHLORIDE 2 MEQ/ML IV SOLN
80.0000 meq | INTRAVENOUS | Status: DC
  Filled 2011-08-26: qty 40

## 2011-08-26 MED ORDER — SODIUM CHLORIDE 0.9 % IV SOLN
0.1000 ug/kg/h | INTRAVENOUS | Status: AC
  Administered 2011-08-27: .2 ug/kg/h via INTRAVENOUS
  Filled 2011-08-26: qty 4

## 2011-08-26 MED ORDER — SODIUM CHLORIDE 0.9 % IV SOLN
INTRAVENOUS | Status: AC
  Administered 2011-08-27: 69.8 mL/h via INTRAVENOUS
  Filled 2011-08-26: qty 40

## 2011-08-26 MED ORDER — NITROGLYCERIN IN D5W 200-5 MCG/ML-% IV SOLN
2.0000 ug/min | INTRAVENOUS | Status: DC
  Filled 2011-08-26: qty 250

## 2011-08-26 MED ORDER — MAGNESIUM SULFATE 50 % IJ SOLN
40.0000 meq | INTRAMUSCULAR | Status: DC
  Filled 2011-08-26: qty 10

## 2011-08-26 MED ORDER — EPINEPHRINE HCL 1 MG/ML IJ SOLN
0.5000 ug/min | INTRAVENOUS | Status: DC
  Filled 2011-08-26: qty 4

## 2011-08-26 MED ORDER — VANCOMYCIN HCL 1000 MG IV SOLR
1500.0000 mg | INTRAVENOUS | Status: AC
  Administered 2011-08-27: 1500 mg via INTRAVENOUS
  Filled 2011-08-26: qty 1500

## 2011-08-26 MED ORDER — DOPAMINE-DEXTROSE 3.2-5 MG/ML-% IV SOLN
2.0000 ug/kg/min | INTRAVENOUS | Status: DC
  Filled 2011-08-26: qty 250

## 2011-08-26 MED ORDER — PHENYLEPHRINE HCL 10 MG/ML IJ SOLN
30.0000 ug/min | INTRAVENOUS | Status: AC
  Administered 2011-08-27: 5 ug/min via INTRAVENOUS
  Filled 2011-08-26: qty 2

## 2011-08-26 MED ORDER — DEXTROSE 5 % IV SOLN
750.0000 mg | INTRAVENOUS | Status: DC
  Filled 2011-08-26: qty 750

## 2011-08-26 MED ORDER — METOPROLOL TARTRATE 12.5 MG HALF TABLET: 12.5000 mg | ORAL_TABLET | Freq: Once | ORAL | Status: DC

## 2011-08-26 NOTE — Consult Note (Signed)
Anesthesia:  Patient is a 71 year old female scheduled for AVR on 08/27/11 for severe symptomatic AS.  Other history includes DM2, HTN, dyslipidemia, depression, gallstone pancreatitis/necrotizing pancreatitis, neuropathy, rhabdomyolysis, rheumatic fever, obesity, osteoporosis, GERD, CHF,  COPD with prior home O2 use, Hepatitis (?C), CKD, former smoker. PCP is Dr. Karleen Hampshire.   Cardiologist is Dr. Nona Dell.  EKG on 08/23/11 showed NSR, right BBB, T wave abnormality, consider lateral ischemia.  Her lateral T wave abnormality is more pronounced since June 23, 2011, otherwise probably not significantly changed.  Cardiac CT on 08/17/11 showed: 1) Right dominant normal coronary arteries  2) Calcified trileaflet AV with motion only between right and  left commisure. Should be approachable through right mini-  thoracotomy  3) Normal ascending aortic root see body of report for  measurements Moderately calcified aortic arch  4) Moderate LVE with moderately reduced EF 38% diffuse  hypokinesis  2D echo on 07/07/11 showed: - Normal LV size with mild LV hypertrophy. Global hypokinesis with EF 30-35%. Moderate aortic stenosis by mean gradient, severe visually and by continuity equation. Would be concerned for low-gradient severe AS in setting of low EF. Would consider dobutamine echo to determine true severe AS versus pseudosevere AS. Normal RV size with mildly decreased RV systolic function.  Subsequently a stress echo was done  on 07/26/11 showing: Stress ECG conclusions: Baseline RBBB, no significant change with infusion. Baseline mean gradient 30 mmHg/peak gradient 48 mmHg, AVA 0.57 cm^2. 10 mcg/kg/min dobutamine mean gradient49 mmHg/peak gradient 49 mmHg, AVA 0.65 cm^2. 20 mcg/kg/min dobutamine mean gradient 62 mmHg/peak gradient 94 mmHg, AVA 0.91 cm^2. This suggests true severe low gradient aortic stenosis.  Cath on 06/03/11 showed: 1. No angiographic evidence of CAD  2. Moderate aortic valve  stenosis based on cath findings.      Aortic Valve: Peak to peak gradient      Mean gradient 15 mmHg      AVA 1.2 cm2      AVA index 0.55  3. CHF with volume overload   PULMONARY FUNCTION TESTS:  FEV1 1.07L (48%)  FVC 1.52L (52%)  FEF25-75 0.72L (38%)  DLCO 53%   Labs reviewed.  CXR showed no evidence of acute cardiopulmonary disease.  Plan to proceed if no significant change in her status.

## 2011-08-27 ENCOUNTER — Ambulatory Visit (HOSPITAL_COMMUNITY): Payer: Medicare Other | Admitting: Vascular Surgery

## 2011-08-27 ENCOUNTER — Inpatient Hospital Stay (HOSPITAL_COMMUNITY): Payer: Medicare Other

## 2011-08-27 ENCOUNTER — Encounter (HOSPITAL_COMMUNITY)
Admission: RE | Disposition: A | Discharge status: Home / Self Care | Payer: Self-pay | Source: Ambulatory Visit | Attending: Thoracic Surgery (Cardiothoracic Vascular Surgery)

## 2011-08-27 ENCOUNTER — Encounter (HOSPITAL_COMMUNITY): Payer: Self-pay | Admitting: Vascular Surgery

## 2011-08-27 ENCOUNTER — Encounter (HOSPITAL_COMMUNITY): Payer: Self-pay

## 2011-08-27 ENCOUNTER — Inpatient Hospital Stay (HOSPITAL_COMMUNITY)
Admission: RE | Admit: 2011-08-27 | Discharge: 2011-08-31 | DRG: 220 | Disposition: A | Discharge status: Home / Self Care | Payer: Medicare Other | Source: Ambulatory Visit | Attending: Thoracic Surgery (Cardiothoracic Vascular Surgery) | Admitting: Thoracic Surgery (Cardiothoracic Vascular Surgery)

## 2011-08-27 DIAGNOSIS — Z79899 Other long term (current) drug therapy: Secondary | ICD-10-CM

## 2011-08-27 DIAGNOSIS — E119 Type 2 diabetes mellitus without complications: Secondary | ICD-10-CM | POA: Diagnosis present

## 2011-08-27 DIAGNOSIS — Z952 Presence of prosthetic heart valve: Secondary | ICD-10-CM

## 2011-08-27 DIAGNOSIS — I359 Nonrheumatic aortic valve disorder, unspecified: Principal | ICD-10-CM | POA: Diagnosis present

## 2011-08-27 DIAGNOSIS — M810 Age-related osteoporosis without current pathological fracture: Secondary | ICD-10-CM | POA: Diagnosis present

## 2011-08-27 DIAGNOSIS — E669 Obesity, unspecified: Secondary | ICD-10-CM | POA: Diagnosis present

## 2011-08-27 DIAGNOSIS — R0902 Hypoxemia: Secondary | ICD-10-CM | POA: Diagnosis not present

## 2011-08-27 DIAGNOSIS — I10 Essential (primary) hypertension: Secondary | ICD-10-CM | POA: Diagnosis present

## 2011-08-27 DIAGNOSIS — J449 Chronic obstructive pulmonary disease, unspecified: Secondary | ICD-10-CM | POA: Diagnosis present

## 2011-08-27 DIAGNOSIS — E785 Hyperlipidemia, unspecified: Secondary | ICD-10-CM | POA: Diagnosis present

## 2011-08-27 DIAGNOSIS — Z7982 Long term (current) use of aspirin: Secondary | ICD-10-CM

## 2011-08-27 DIAGNOSIS — I5022 Chronic systolic (congestive) heart failure: Secondary | ICD-10-CM | POA: Diagnosis present

## 2011-08-27 DIAGNOSIS — Z794 Long term (current) use of insulin: Secondary | ICD-10-CM

## 2011-08-27 DIAGNOSIS — Z01812 Encounter for preprocedural laboratory examination: Secondary | ICD-10-CM

## 2011-08-27 DIAGNOSIS — I509 Heart failure, unspecified: Secondary | ICD-10-CM | POA: Diagnosis present

## 2011-08-27 DIAGNOSIS — F329 Major depressive disorder, single episode, unspecified: Secondary | ICD-10-CM | POA: Diagnosis present

## 2011-08-27 DIAGNOSIS — D62 Acute posthemorrhagic anemia: Secondary | ICD-10-CM | POA: Diagnosis not present

## 2011-08-27 DIAGNOSIS — Z87891 Personal history of nicotine dependence: Secondary | ICD-10-CM

## 2011-08-27 DIAGNOSIS — J4489 Other specified chronic obstructive pulmonary disease: Secondary | ICD-10-CM | POA: Diagnosis present

## 2011-08-27 DIAGNOSIS — Z9641 Presence of insulin pump (external) (internal): Secondary | ICD-10-CM

## 2011-08-27 DIAGNOSIS — F3289 Other specified depressive episodes: Secondary | ICD-10-CM | POA: Diagnosis present

## 2011-08-27 HISTORY — PX: AORTIC VALVE REPLACEMENT: SHX41

## 2011-08-27 LAB — POCT I-STAT 3, ART BLOOD GAS (G3+)
Acid-Base Excess: 7 mmol/L — ABNORMAL HIGH (ref 0.0–2.0)
Acid-Base Excess: 1 mmol/L (ref 0.0–2.0)
Acid-Base Excess: 2 mmol/L (ref 0.0–2.0)
Bicarbonate: 29 mEq/L — ABNORMAL HIGH (ref 20.0–24.0)
Bicarbonate: 28.9 mEq/L — ABNORMAL HIGH (ref 20.0–24.0)
Bicarbonate: 26.8 mEq/L — ABNORMAL HIGH (ref 20.0–24.0)
O2 Saturation: 100 %
O2 Saturation: 100 %
O2 Saturation: 99 %
Patient temperature: 35.7
TCO2: 29 mmol/L (ref 0–100)
TCO2: 30 mmol/L (ref 0–100)
TCO2: 28 mmol/L (ref 0–100)
pCO2 arterial: 38.6 mmHg (ref 35.0–45.0)
pCO2 arterial: 47.2 mmHg — ABNORMAL HIGH (ref 35.0–45.0)
pH, Arterial: 7.462 — ABNORMAL HIGH (ref 7.350–7.400)
pH, Arterial: 7.396 (ref 7.350–7.400)
pO2, Arterial: 178 mmHg — ABNORMAL HIGH (ref 80.0–100.0)
pO2, Arterial: 146 mmHg — ABNORMAL HIGH (ref 80.0–100.0)

## 2011-08-27 LAB — POCT I-STAT 4, (NA,K, GLUC, HGB,HCT)
Glucose, Bld: 203 mg/dL — ABNORMAL HIGH (ref 70–99)
Glucose, Bld: 147 mg/dL — ABNORMAL HIGH (ref 70–99)
Glucose, Bld: 145 mg/dL — ABNORMAL HIGH (ref 70–99)
HCT: 32 % — ABNORMAL LOW (ref 36.0–46.0)
HCT: 24 % — ABNORMAL LOW (ref 36.0–46.0)
HCT: 29 % — ABNORMAL LOW (ref 36.0–46.0)
HCT: 25 % — ABNORMAL LOW (ref 36.0–46.0)
Hemoglobin: 10.9 g/dL — ABNORMAL LOW (ref 12.0–15.0)
Hemoglobin: 10.5 g/dL — ABNORMAL LOW (ref 12.0–15.0)
Potassium: 3.6 mEq/L (ref 3.5–5.1)
Potassium: 3 mEq/L — ABNORMAL LOW (ref 3.5–5.1)
Potassium: 4.1 mEq/L (ref 3.5–5.1)
Potassium: 3.4 mEq/L — ABNORMAL LOW (ref 3.5–5.1)
Potassium: 3.1 mEq/L — ABNORMAL LOW (ref 3.5–5.1)
Sodium: 141 mEq/L (ref 135–145)
Sodium: 145 mEq/L (ref 135–145)
Sodium: 141 mEq/L (ref 135–145)
Sodium: 144 mEq/L (ref 135–145)

## 2011-08-27 LAB — CBC
HCT: 29.9 % — ABNORMAL LOW (ref 36.0–46.0)
Hemoglobin: 9.9 g/dL — ABNORMAL LOW (ref 12.0–15.0)
Hemoglobin: 9.3 g/dL — ABNORMAL LOW (ref 12.0–15.0)
MCH: 26 pg (ref 26.0–34.0)
MCH: 26.1 pg (ref 26.0–34.0)
MCHC: 32.6 g/dL (ref 30.0–36.0)
RBC: 3.81 MIL/uL — ABNORMAL LOW (ref 3.87–5.11)

## 2011-08-27 LAB — PROTIME-INR
INR: 1.32 (ref 0.00–1.49)
Prothrombin Time: 16.6 seconds — ABNORMAL HIGH (ref 11.6–15.2)

## 2011-08-27 LAB — GLUCOSE, CAPILLARY: Glucose-Capillary: 201 mg/dL — ABNORMAL HIGH (ref 70–99)

## 2011-08-27 LAB — CREATININE, SERUM: Creatinine, Ser: 0.68 mg/dL (ref 0.50–1.10)

## 2011-08-27 LAB — POCT I-STAT, CHEM 8
BUN: 17 mg/dL (ref 6–23)
Creatinine, Ser: 0.8 mg/dL (ref 0.50–1.10)
Sodium: 146 mEq/L — ABNORMAL HIGH (ref 135–145)
TCO2: 27 mmol/L (ref 0–100)

## 2011-08-27 LAB — TYPE AND SCREEN
ABO/RH(D): B POS
Antibody Screen: NEGATIVE

## 2011-08-27 LAB — HEMOGLOBIN AND HEMATOCRIT, BLOOD: Hemoglobin: 7.9 g/dL — ABNORMAL LOW (ref 12.0–15.0)

## 2011-08-27 SURGERY — REPLACEMENT, AORTIC VALVE, OPEN
Anesthesia: General | Site: Chest | Wound class: Clean

## 2011-08-27 MED ORDER — MIDAZOLAM HCL 2 MG/2ML IJ SOLN: 2.0000 mg | INTRAMUSCULAR | Status: DC | PRN

## 2011-08-27 MED ORDER — INSULIN ASPART 100 UNIT/ML ~~LOC~~ SOLN
0.0000 [IU] | SUBCUTANEOUS | Status: DC
  Administered 2011-08-28: 2 [IU] via SUBCUTANEOUS
  Administered 2011-08-28: 4 [IU] via SUBCUTANEOUS
  Administered 2011-08-28: 2 [IU] via SUBCUTANEOUS

## 2011-08-27 MED ORDER — DEXTROSE 5 % IV SOLN
1.5000 g | Freq: Two times a day (BID) | INTRAVENOUS | Status: AC
  Administered 2011-08-27 – 2011-08-29 (×4): 1.5 g via INTRAVENOUS
  Filled 2011-08-27 (×4): qty 1.5

## 2011-08-27 MED ORDER — DOPAMINE-DEXTROSE 3.2-5 MG/ML-% IV SOLN
INTRAVENOUS | Status: DC | PRN
  Administered 2011-08-27: 3 ug/kg/min via INTRAVENOUS

## 2011-08-27 MED ORDER — ACETAMINOPHEN 160 MG/5ML PO SOLN: 650.0000 mg | ORAL | Status: AC

## 2011-08-27 MED ORDER — HEMOSTATIC AGENTS (NO CHARGE) OPTIME
TOPICAL | Status: DC | PRN
  Administered 2011-08-27: 3 via TOPICAL

## 2011-08-27 MED ORDER — BISACODYL 5 MG PO TBEC
10.0000 mg | DELAYED_RELEASE_TABLET | Freq: Every day | ORAL | Status: DC
  Administered 2011-08-28 – 2011-08-31 (×4): 10 mg via ORAL
  Filled 2011-08-27 (×4): qty 2

## 2011-08-27 MED ORDER — CHLORHEXIDINE GLUCONATE 4 % EX LIQD: 30.0000 mL | CUTANEOUS | Status: DC

## 2011-08-27 MED ORDER — ALBUMIN HUMAN 5 % IV SOLN
INTRAVENOUS | Status: DC | PRN
  Administered 2011-08-27: 12:00:00 via INTRAVENOUS

## 2011-08-27 MED ORDER — TRAMADOL HCL 50 MG PO TABS
50.0000 mg | ORAL_TABLET | ORAL | Status: DC | PRN
  Administered 2011-08-28 – 2011-08-30 (×6): 100 mg via ORAL
  Filled 2011-08-27 (×7): qty 2

## 2011-08-27 MED ORDER — SODIUM CHLORIDE 0.9 % IJ SOLN
3.0000 mL | Freq: Two times a day (BID) | INTRAMUSCULAR | Status: DC
  Administered 2011-08-28 – 2011-08-29 (×2): 3 mL via INTRAVENOUS

## 2011-08-27 MED ORDER — SODIUM CHLORIDE 0.9 % IV SOLN
5.0000 g | INTRAVENOUS | Status: DC
  Filled 2011-08-27: qty 20

## 2011-08-27 MED ORDER — SUFENTANIL CITRATE 50 MCG/ML IV SOLN
INTRAVENOUS | Status: DC | PRN
  Administered 2011-08-27: 20 ug via INTRAVENOUS
  Administered 2011-08-27: 10 ug via INTRAVENOUS
  Administered 2011-08-27 (×2): 20 ug via INTRAVENOUS
  Administered 2011-08-27: 5 ug via INTRAVENOUS
  Administered 2011-08-27 (×2): 10 ug via INTRAVENOUS
  Administered 2011-08-27: 55 ug via INTRAVENOUS

## 2011-08-27 MED ORDER — 0.9 % SODIUM CHLORIDE (POUR BTL) OPTIME
TOPICAL | Status: DC | PRN
  Administered 2011-08-27: 6000 mL

## 2011-08-27 MED ORDER — METOPROLOL TARTRATE 12.5 MG HALF TABLET
12.5000 mg | ORAL_TABLET | Freq: Two times a day (BID) | ORAL | Status: DC
  Administered 2011-08-28: 12.5 mg via ORAL
  Filled 2011-08-27 (×3): qty 1

## 2011-08-27 MED ORDER — DOCUSATE SODIUM 100 MG PO CAPS
200.0000 mg | ORAL_CAPSULE | Freq: Every day | ORAL | Status: DC
  Administered 2011-08-28 – 2011-08-31 (×4): 200 mg via ORAL
  Filled 2011-08-27 (×2): qty 2

## 2011-08-27 MED ORDER — METOPROLOL TARTRATE 25 MG/10 ML ORAL SUSPENSION
12.5000 mg | Freq: Two times a day (BID) | ORAL | Status: DC
  Administered 2011-08-27: 12.5 mg
  Filled 2011-08-27 (×3): qty 5

## 2011-08-27 MED ORDER — MORPHINE SULFATE 2 MG/ML IJ SOLN
1.0000 mg | INTRAMUSCULAR | Status: AC | PRN
  Administered 2011-08-27 (×2): 2 mg via INTRAVENOUS
  Filled 2011-08-27 (×2): qty 1

## 2011-08-27 MED ORDER — SODIUM CHLORIDE 0.9 % IV SOLN: INTRAVENOUS | Status: DC

## 2011-08-27 MED ORDER — CALCIUM CHLORIDE 10 % IV SOLN
1.0000 g | Freq: Once | INTRAVENOUS | Status: AC | PRN
  Filled 2011-08-27: qty 10

## 2011-08-27 MED ORDER — SODIUM CHLORIDE 0.9 % IV SOLN
0.1000 ug/kg/h | INTRAVENOUS | Status: DC
  Filled 2011-08-27 (×2): qty 2

## 2011-08-27 MED ORDER — ASPIRIN EC 325 MG PO TBEC
325.0000 mg | DELAYED_RELEASE_TABLET | Freq: Every day | ORAL | Status: DC
  Administered 2011-08-28 – 2011-08-31 (×4): 325 mg via ORAL
  Filled 2011-08-27 (×4): qty 1

## 2011-08-27 MED ORDER — INSULIN ASPART 100 UNIT/ML ~~LOC~~ SOLN
0.0000 [IU] | SUBCUTANEOUS | Status: AC
  Administered 2011-08-27: 4 [IU] via SUBCUTANEOUS
  Administered 2011-08-27: 2 [IU] via SUBCUTANEOUS

## 2011-08-27 MED ORDER — SODIUM CHLORIDE 0.9 % IJ SOLN: 3.0000 mL | INTRAMUSCULAR | Status: DC | PRN

## 2011-08-27 MED ORDER — MIDAZOLAM HCL 5 MG/5ML IJ SOLN
INTRAMUSCULAR | Status: DC | PRN
  Administered 2011-08-27: 1 mg via INTRAVENOUS
  Administered 2011-08-27 (×4): 2 mg via INTRAVENOUS
  Administered 2011-08-27: 1 mg via INTRAVENOUS

## 2011-08-27 MED ORDER — PANTOPRAZOLE SODIUM 40 MG PO TBEC: 40.0000 mg | DELAYED_RELEASE_TABLET | Freq: Every day | ORAL | Status: DC

## 2011-08-27 MED ORDER — VANCOMYCIN HCL 1000 MG IV SOLR
1000.0000 mg | Freq: Once | INTRAVENOUS | Status: AC
  Administered 2011-08-27: 1000 mg via INTRAVENOUS
  Filled 2011-08-27: qty 1000

## 2011-08-27 MED ORDER — ONDANSETRON HCL 4 MG/2ML IJ SOLN
4.0000 mg | Freq: Four times a day (QID) | INTRAMUSCULAR | Status: DC | PRN
  Administered 2011-08-27 – 2011-08-28 (×2): 4 mg via INTRAVENOUS
  Filled 2011-08-27 (×2): qty 2

## 2011-08-27 MED ORDER — ASPIRIN 81 MG PO CHEW: 324.0000 mg | CHEWABLE_TABLET | Freq: Every day | ORAL | Status: DC

## 2011-08-27 MED ORDER — METOPROLOL TARTRATE 1 MG/ML IV SOLN: 2.5000 mg | INTRAVENOUS | Status: DC | PRN

## 2011-08-27 MED ORDER — SODIUM CHLORIDE 0.9 % IV SOLN
INTRAVENOUS | Status: DC
  Administered 2011-08-27: 0.9 [IU]/h via INTRAVENOUS
  Administered 2011-08-27: 6 [IU]/h via INTRAVENOUS
  Administered 2011-08-27: 1.3 [IU]/h via INTRAVENOUS
  Administered 2011-08-28: 7.8 [IU]/h via INTRAVENOUS
  Administered 2011-08-28: 2 [IU]/h via INTRAVENOUS
  Filled 2011-08-27 (×2): qty 1

## 2011-08-27 MED ORDER — VECURONIUM BROMIDE 10 MG IV SOLR
INTRAVENOUS | Status: DC | PRN
  Administered 2011-08-27 (×2): 5 mg via INTRAVENOUS
  Administered 2011-08-27: 10 mg via INTRAVENOUS

## 2011-08-27 MED ORDER — SODIUM CHLORIDE 0.9 % IV SOLN: 250.0000 mL | INTRAVENOUS | Status: DC

## 2011-08-27 MED ORDER — LACTATED RINGERS IV SOLN
INTRAVENOUS | Status: DC
  Administered 2011-08-27: 13:00:00 via INTRAVENOUS

## 2011-08-27 MED ORDER — NITROGLYCERIN IN D5W 200-5 MCG/ML-% IV SOLN
0.0000 ug/min | INTRAVENOUS | Status: DC
  Administered 2011-08-27: 25 ug/min via INTRAVENOUS

## 2011-08-27 MED ORDER — HEPARIN SODIUM (PORCINE) 1000 UNIT/ML IJ SOLN
INTRAMUSCULAR | Status: DC | PRN
  Administered 2011-08-27: 25000 [IU] via INTRAVENOUS

## 2011-08-27 MED ORDER — INSULIN REGULAR BOLUS VIA INFUSION
0.0000 [IU] | Freq: Three times a day (TID) | INTRAVENOUS | Status: DC
  Filled 2011-08-27: qty 10

## 2011-08-27 MED ORDER — SODIUM CHLORIDE 0.45 % IV SOLN
INTRAVENOUS | Status: DC
  Administered 2011-08-27: 13:00:00 via INTRAVENOUS
  Administered 2011-08-29: 20 mL/h via INTRAVENOUS

## 2011-08-27 MED ORDER — BISACODYL 10 MG RE SUPP: 10.0000 mg | Freq: Every day | RECTAL | Status: DC

## 2011-08-27 MED ORDER — LACTATED RINGERS IV SOLN
INTRAVENOUS | Status: DC | PRN
  Administered 2011-08-27 (×3): via INTRAVENOUS

## 2011-08-27 MED ORDER — FAMOTIDINE IN NACL 20-0.9 MG/50ML-% IV SOLN
20.0000 mg | Freq: Two times a day (BID) | INTRAVENOUS | Status: AC
  Administered 2011-08-27: 20 mg via INTRAVENOUS

## 2011-08-27 MED ORDER — PROPOFOL 10 MG/ML IV EMUL
INTRAVENOUS | Status: DC | PRN
  Administered 2011-08-27: 20 mg via INTRAVENOUS

## 2011-08-27 MED ORDER — MORPHINE SULFATE 4 MG/ML IJ SOLN
2.0000 mg | INTRAMUSCULAR | Status: DC | PRN
  Administered 2011-08-27 – 2011-08-28 (×7): 4 mg via INTRAVENOUS
  Filled 2011-08-27 (×7): qty 1

## 2011-08-27 MED ORDER — SODIUM CHLORIDE 0.9 % IV SOLN
10.0000 g | Freq: Once | INTRAVENOUS | Status: DC
  Filled 2011-08-27: qty 20

## 2011-08-27 MED ORDER — ACETAMINOPHEN 500 MG PO TABS
1000.0000 mg | ORAL_TABLET | Freq: Four times a day (QID) | ORAL | Status: DC
  Administered 2011-08-27 – 2011-08-30 (×10): 1000 mg via ORAL
  Filled 2011-08-27 (×22): qty 2

## 2011-08-27 MED ORDER — ACETAMINOPHEN 160 MG/5ML PO SOLN
975.0000 mg | Freq: Four times a day (QID) | ORAL | Status: DC
  Filled 2011-08-27: qty 40.6

## 2011-08-27 MED ORDER — ROCURONIUM BROMIDE 100 MG/10ML IV SOLN
INTRAVENOUS | Status: DC | PRN
  Administered 2011-08-27: 50 mg via INTRAVENOUS

## 2011-08-27 MED ORDER — ACETAMINOPHEN 650 MG RE SUPP
650.0000 mg | RECTAL | Status: AC
  Administered 2011-08-27: 650 mg via RECTAL

## 2011-08-27 MED ORDER — PHENYLEPHRINE HCL 10 MG/ML IJ SOLN
0.0000 ug/min | INTRAVENOUS | Status: DC
  Administered 2011-08-27: 0 ug/min via INTRAVENOUS
  Filled 2011-08-27: qty 2

## 2011-08-27 MED ORDER — ALBUMIN HUMAN 5 % IV SOLN
250.0000 mL | INTRAVENOUS | Status: AC | PRN
  Administered 2011-08-27 (×2): 250 mL via INTRAVENOUS

## 2011-08-27 MED ORDER — LIDOCAINE HCL (CARDIAC) 20 MG/ML IV SOLN
INTRAVENOUS | Status: DC | PRN
  Administered 2011-08-27: 40 mg via INTRAVENOUS

## 2011-08-27 MED ORDER — POTASSIUM CHLORIDE 10 MEQ/50ML IV SOLN
10.0000 meq | INTRAVENOUS | Status: AC
  Administered 2011-08-27 (×2): 10 meq via INTRAVENOUS

## 2011-08-27 MED ORDER — MAGNESIUM SULFATE 40 MG/ML IJ SOLN
4.0000 g | Freq: Once | INTRAMUSCULAR | Status: AC
  Administered 2011-08-27: 4 g via INTRAVENOUS
  Filled 2011-08-27: qty 100

## 2011-08-27 MED ORDER — POTASSIUM CHLORIDE 10 MEQ/50ML IV SOLN
10.0000 meq | INTRAVENOUS | Status: AC
  Administered 2011-08-27 (×3): 10 meq via INTRAVENOUS

## 2011-08-27 MED ORDER — DOPAMINE-DEXTROSE 3.2-5 MG/ML-% IV SOLN
0.0000 ug/kg/min | INTRAVENOUS | Status: DC
  Administered 2011-08-27: 2 ug/kg/min via INTRAVENOUS

## 2011-08-27 MED ORDER — PROTAMINE SULFATE 10 MG/ML IV SOLN
INTRAVENOUS | Status: DC | PRN
  Administered 2011-08-27: 280 mg via INTRAVENOUS

## 2011-08-27 SURGICAL SUPPLY — 128 items
ADAPTER CARDIO PERF ANTE/RETRO (ADAPTER) ×3 IMPLANT
ADH SKN CLS APL DERMABOND .7 (GAUZE/BANDAGES/DRESSINGS) ×4
ADPR PRFSN 84XANTGRD RTRGD (ADAPTER) ×2
APL SKNCLS STERI-STRIP NONHPOA (GAUZE/BANDAGES/DRESSINGS) ×2
ATTRACTOMAT 16X20 MAGNETIC DRP (DRAPES) ×3 IMPLANT
BAG DECANTER FOR FLEXI CONT (MISCELLANEOUS) ×3 IMPLANT
BENZOIN TINCTURE PRP APPL 2/3 (GAUZE/BANDAGES/DRESSINGS) ×3 IMPLANT
BLADE STERNUM SYSTEM 6 (BLADE) ×3 IMPLANT
BLADE SURG 11 STRL SS (BLADE) ×5 IMPLANT
BLADE SURG ROTATE 9660 (MISCELLANEOUS) ×2 IMPLANT
CANISTER SUCTION 2500CC (MISCELLANEOUS) ×6 IMPLANT
CANN STRAIGHTSHOT ART ANGLE (CANNULA) IMPLANT
CANN STRAIGHTSHOT ART STRT (CANNULA) IMPLANT
CANNULA DLP CORONARY OST 12FR (MISCELLANEOUS) ×2 IMPLANT
CANNULA FEM ARTERIAL 21F (MISCELLANEOUS) IMPLANT
CANNULA FEM VENOUS REMOTE 22FR (CANNULA) IMPLANT
CANNULA GUNDRY RCSP 15FR (MISCELLANEOUS) ×3 IMPLANT
CANNULA OPTISITE PERFUSION 16F (CANNULA) ×3 IMPLANT
CANNULA OPTISITE PERFUSION 18F (CANNULA) ×3 IMPLANT
CARDIAC SUCTION (MISCELLANEOUS) ×3 IMPLANT
CATH ENDOVENT PULMONARY (CATHETERS) ×3 IMPLANT
CATH HEART VENT LEFT (CATHETERS) ×1 IMPLANT
CATH ROBINSON RED A/P 18FR (CATHETERS) ×7 IMPLANT
CATH THORACIC 36FR (CATHETERS) ×2 IMPLANT
CLOTH BEACON ORANGE TIMEOUT ST (SAFETY) ×3 IMPLANT
CONN ST 1/4X3/8  BEN (MISCELLANEOUS) ×1
CONN ST 1/4X3/8 BEN (MISCELLANEOUS) ×2 IMPLANT
CONT SPEC 4OZ CLIKSEAL STRL BL (MISCELLANEOUS) ×2 IMPLANT
CONT SPEC STER OR (MISCELLANEOUS) ×3 IMPLANT
COVER MAYO STAND STRL (DRAPES) ×3 IMPLANT
COVER SURGICAL LIGHT HANDLE (MISCELLANEOUS) ×6 IMPLANT
CRADLE DONUT ADULT HEAD (MISCELLANEOUS) ×3 IMPLANT
DERMABOND ADVANCED (GAUZE/BANDAGES/DRESSINGS) ×2
DERMABOND ADVANCED .7 DNX12 (GAUZE/BANDAGES/DRESSINGS) ×4 IMPLANT
DEVICE TROCAR PUNCTURE CLOSURE (ENDOMECHANICALS) ×3 IMPLANT
DRAIN CHANNEL 28F RND 3/8 FF (WOUND CARE) ×6 IMPLANT
DRAPE BILATERAL SPLIT (DRAPES) ×3 IMPLANT
DRAPE C-ARM 42X72 X-RAY (DRAPES) ×3 IMPLANT
DRAPE CV SPLIT W-CLR ANES SCRN (DRAPES) ×3 IMPLANT
DRAPE INCISE IOBAN 66X45 STRL (DRAPES) ×3 IMPLANT
DRAPE SLUSH/WARMER DISC (DRAPES) IMPLANT
DRSG COVADERM 4X8 (GAUZE/BANDAGES/DRESSINGS) ×3 IMPLANT
ELECT BLADE 6.5 EXT (BLADE) ×3 IMPLANT
ELECT REM PT RETURN 9FT ADLT (ELECTROSURGICAL) ×6
ELECTRODE REM PT RTRN 9FT ADLT (ELECTROSURGICAL) ×4 IMPLANT
FEMORAL VENOUS CANN RAP (CANNULA) IMPLANT
GAUZE SPONGE 4X4 16PLY XRAY LF (GAUZE/BANDAGES/DRESSINGS) ×2 IMPLANT
GLOVE BIO SURGEON STRL SZ 6 (GLOVE) ×8 IMPLANT
GLOVE BIO SURGEON STRL SZ 6.5 (GLOVE) ×8 IMPLANT
GLOVE BIOGEL PI IND STRL 6 (GLOVE) ×1 IMPLANT
GLOVE BIOGEL PI IND STRL 6.5 (GLOVE) ×3 IMPLANT
GLOVE BIOGEL PI INDICATOR 6 (GLOVE) ×1
GLOVE BIOGEL PI INDICATOR 6.5 (GLOVE) ×3
GLOVE ORTHO TXT STRL SZ7.5 (GLOVE) ×9 IMPLANT
GOWN STRL NON-REIN LRG LVL3 (GOWN DISPOSABLE) ×18 IMPLANT
INSERT CONFORM CROSS CLAMP 66M (MISCELLANEOUS) ×3 IMPLANT
INSERT CONFORM CROSS CLAMP 86M (MISCELLANEOUS) ×3 IMPLANT
INSERT FOGARTY XLG (MISCELLANEOUS) ×2 IMPLANT
KIT BASIN OR (CUSTOM PROCEDURE TRAY) ×3 IMPLANT
KIT DILATOR VASC 18G NDL (KITS) ×3 IMPLANT
KIT DRAINAGE VACCUM ASSIST (KITS) ×2 IMPLANT
KIT FEM CANN BIOMEDICUS 17FR (MISCELLANEOUS) IMPLANT
KIT FEM CANN BIOMEDICUS 19FR (MISCELLANEOUS) IMPLANT
KIT ROOM TURNOVER OR (KITS) ×3 IMPLANT
KIT SUCTION CATH 14FR (SUCTIONS) ×3 IMPLANT
LEAD PACING MYOCARDI (MISCELLANEOUS) ×3 IMPLANT
LINE VENT (MISCELLANEOUS) ×2 IMPLANT
NDL AORTIC ROOT 14G 7F (CATHETERS) ×1 IMPLANT
NEEDLE AORTIC ROOT 14G 7F (CATHETERS) ×3 IMPLANT
NS IRRIG 1000ML POUR BTL (IV SOLUTION) ×15 IMPLANT
PACK OPEN HEART (CUSTOM PROCEDURE TRAY) ×3 IMPLANT
PAD ARMBOARD 7.5X6 YLW CONV (MISCELLANEOUS) ×6 IMPLANT
PAD ELECT DEFIB RADIOL ZOLL (MISCELLANEOUS) ×3 IMPLANT
PAD SHARPS MAGNETIC DISPOSAL (MISCELLANEOUS) ×2 IMPLANT
RETRACTOR TRL SOFT TISSUE LG (INSTRUMENTS) IMPLANT
RETRACTOR TRM SOFT TISSUE 7.5 (INSTRUMENTS) IMPLANT
RUBBERBAND STERILE (MISCELLANEOUS) IMPLANT
SET CANNULATION TOURNIQUET (MISCELLANEOUS) ×3 IMPLANT
SET CARDIOPLEGIA MPS 5001102 (MISCELLANEOUS) ×2 IMPLANT
SET IRRIG TUBING LAPAROSCOPIC (IRRIGATION / IRRIGATOR) ×3 IMPLANT
SOLUTION ANTI FOG 6CC (MISCELLANEOUS) ×3 IMPLANT
SPONGE GAUZE 4X4 12PLY (GAUZE/BANDAGES/DRESSINGS) ×3 IMPLANT
STOPCOCK MORSE 400PSI 3WAY (MISCELLANEOUS) ×2 IMPLANT
STRIP CLOSURE SKIN 1/2X4 (GAUZE/BANDAGES/DRESSINGS) ×3 IMPLANT
SUT BONE WAX W31G (SUTURE) ×3 IMPLANT
SUT ETHIBON 2 0 V 52N 30 (SUTURE) ×4 IMPLANT
SUT ETHIBOND 2 0 SH (SUTURE) IMPLANT
SUT ETHIBOND X763 2 0 SH 1 (SUTURE) ×7 IMPLANT
SUT GORETEX CV 4 TH 22 36 (SUTURE) IMPLANT
SUT GORETEX CV4 TH-18 (SUTURE) ×2 IMPLANT
SUT MNCRL AB 3-0 PS2 18 (SUTURE) ×6 IMPLANT
SUT PDS AB 1 CTX 36 (SUTURE) ×4 IMPLANT
SUT PROLENE 3 0 SH DA (SUTURE) ×6 IMPLANT
SUT PROLENE 3 0 SH1 36 (SUTURE) ×12 IMPLANT
SUT PROLENE 4 0 RB 1 (SUTURE) ×21
SUT PROLENE 4 0 SH DA (SUTURE) ×2 IMPLANT
SUT PROLENE 4-0 RB1 .5 CRCL 36 (SUTURE) ×11 IMPLANT
SUT PROLENE 5 0 C 1 36 (SUTURE) ×6 IMPLANT
SUT PROLENE 6 0 C 1 30 (SUTURE) ×6 IMPLANT
SUT SILK  1 MH (SUTURE)
SUT SILK 1 MH (SUTURE) IMPLANT
SUT SILK 1 TIES 10X30 (SUTURE) ×3 IMPLANT
SUT SILK 2 0 SH CR/8 (SUTURE) ×3 IMPLANT
SUT SILK 2 0 TIES 10X30 (SUTURE) ×3 IMPLANT
SUT SILK 2 0SH CR/8 30 (SUTURE) ×6 IMPLANT
SUT SILK 3 0 (SUTURE) ×3
SUT SILK 3 0 SH CR/8 (SUTURE) ×3 IMPLANT
SUT SILK 3 0SH CR/8 30 (SUTURE) ×6 IMPLANT
SUT SILK 3-0 18XBRD TIE 12 (SUTURE) ×2 IMPLANT
SUT TEM PAC WIRE 2 0 SH (SUTURE) ×6 IMPLANT
SUT VIC AB 1 CTX 18 (SUTURE) ×2 IMPLANT
SUT VIC AB 2-0 CTX 36 (SUTURE) ×6 IMPLANT
SUT VIC AB 2-0 UR6 27 (SUTURE) ×6 IMPLANT
SUT VIC AB 3-0 SH 8-18 (SUTURE) ×12 IMPLANT
SUT VICRYL 2 TP 1 (SUTURE) ×6 IMPLANT
SYRINGE 10CC LL (SYRINGE) ×3 IMPLANT
SYSTEM SAHARA CHEST DRAIN ATS (WOUND CARE) ×3 IMPLANT
TAPE CLOTH SURG 4X10 WHT LF (GAUZE/BANDAGES/DRESSINGS) ×2 IMPLANT
TOWEL OR 17X24 6PK STRL BLUE (TOWEL DISPOSABLE) ×3 IMPLANT
TOWEL OR 17X26 10 PK STRL BLUE (TOWEL DISPOSABLE) ×3 IMPLANT
TRAY FOLEY IC TEMP SENS 14FR (CATHETERS) ×3 IMPLANT
TROCAR XCEL BLADELESS 5X75MML (TROCAR) ×3 IMPLANT
TROCAR XCEL NON-BLD 11X100MML (ENDOMECHANICALS) ×6 IMPLANT
TUBE SUCT INTRACARD DLP 20F (MISCELLANEOUS) ×3 IMPLANT
UNDERPAD 30X30 INCONTINENT (UNDERPADS AND DIAPERS) ×3 IMPLANT
VALVE AORTIC MAGNA 23MM (Prosthesis & Implant Heart) ×2 IMPLANT
VENT LEFT HEART 12002 (CATHETERS) ×3
WATER STERILE IRR 1000ML POUR (IV SOLUTION) ×6 IMPLANT

## 2011-08-27 NOTE — Transfer of Care (Signed)
Immediate Anesthesia Transfer of Care Note  Patient: Kathryn Morgan  Procedure(s) Performed: Procedure(s) (LRB): AORTIC VALVE REPLACEMENT (AVR) (N/A)  Patient Location: SICU  Anesthesia Type: General  Level of Consciousness: Patient remains intubated per anesthesia plan  Airway & Oxygen Therapy: Patient remains intubated per anesthesia plan and Patient placed on Ventilator (see vital sign flow sheet for setting)  Post-op Assessment: Post -op Vital signs reviewed and stable  Post vital signs: Reviewed and stable  Complications: No apparent anesthesia complications

## 2011-08-27 NOTE — Anesthesia Procedure Notes (Signed)
Procedure Name: Intubation Date/Time: 08/27/2011 7:57 AM Performed by: Glendora Score A Pre-anesthesia Checklist: Patient identified, Emergency Drugs available, Suction available and Patient being monitored Patient Re-evaluated:Patient Re-evaluated prior to inductionOxygen Delivery Method: Circle system utilized Preoxygenation: Pre-oxygenation with 100% oxygen Intubation Type: IV induction Ventilation: Mask ventilation without difficulty and Oral airway inserted - appropriate to patient size Laryngoscope Size: Hyacinth Meeker and 2 Grade View: Grade I Tube type: Oral Tube size: 8.0 mm Number of attempts: 1 Airway Equipment and Method: Stylet Placement Confirmation: ETT inserted through vocal cords under direct vision,  positive ETCO2 and breath sounds checked- equal and bilateral Secured at: 22 cm Tube secured with: Tape Dental Injury: Teeth and Oropharynx as per pre-operative assessment

## 2011-08-27 NOTE — Progress Notes (Signed)
Spoke /w Gina in [place]. Bank to confirm need for additional tube of blood

## 2011-08-27 NOTE — Brief Op Note (Signed)
08/27/2011  11:20 AM  PATIENT:  Kathryn Morgan  71 y.o. female  PRE-OPERATIVE DIAGNOSIS:  AORTIC STENOSIS  POST-OPERATIVE DIAGNOSIS:  AORTIC STENOSIS  PROCEDURE:  Procedure(s) (LRB): AORTIC VALVE REPLACEMENT (AVR 23mm Edwards Magna Ease Pericardial Tissue Valve)  SURGEON:  Surgeon(s) and Role:    * Purcell Nails, MD - Primary  PHYSICIAN ASSISTANT: Lowella Dandy PA-C  ANESTHESIA:   general  EBL:  Total I/O In: 1000 [I.V.:1000] Out: 900 [Urine:900]  SPECIMEN:  Source of Specimen:  Aortic Valve  DISPOSITION OF SPECIMEN:  PATHOLOGY  PATIENT DISPOSITION:  ICU - intubated and hemodynamically stable.    [name] 08/27/2011 12:16 PM

## 2011-08-27 NOTE — Procedures (Signed)
Extubation Procedure Note  Patient Details:   Name: Kathryn Morgan DOB: 01-20-1941 MRN: [id number]   Airway Documentation:     Evaluation  O2 sats: stable throughout and currently acceptable Complications: No apparent complications Patient did tolerate procedure well. Bilateral Breath Sounds: Clear   Yes Pt awake and alert, extubated per protocol. Placed on 2L [place], sats 99%. Positive cuff leak, NIF-28, VC 700, IS 500, BBS cl. Pt able to vocalize.  Arloa Koh 08/27/2011, 4:55 PM 2

## 2011-08-27 NOTE — Anesthesia Preprocedure Evaluation (Addendum)
Anesthesia Evaluation  Patient identified by MRN, date of birth, ID band Patient awake    Reviewed: Allergy & Precautions, H&P , NPO status , Patient's Chart, lab work & pertinent test results, reviewed documented beta blocker date and time   History of Anesthesia Complications Negative for: history of anesthetic complications  Airway Mallampati: III TM Distance: >3 FB Neck ROM: Full    Dental  (+) Teeth Intact, Edentulous Upper and Edentulous Lower   Pulmonary COPD COPD inhaler, former smoker breath sounds clear to auscultation  Pulmonary exam normal       Cardiovascular hypertension, Pt. on medications and Pt. on home beta blockers +CHF + Valvular Problems/Murmurs (ECHO global hypokinesis with EF 38%, AVA 0.57 with PG 48mm Hg, mean grad 30mm Hg) AS Rhythm:Regular Rate:Normal  EF 30-35%   Neuro/Psych PSYCHIATRIC DISORDERS Depression    GI/Hepatic GERD-  Medicated and Controlled,  Endo/Other  Diabetes mellitus- (glu 201), Type 1, Insulin Dependent and Oral Hypoglycemic AgentsMorbid obesity  Renal/GU      Musculoskeletal   Abdominal (+) + obese,   Peds  Hematology   Anesthesia Other Findings   Reproductive/Obstetrics                         Anesthesia Physical Anesthesia Plan  ASA: IV  Anesthesia Plan: General   Post-op Pain Management:    Induction: Intravenous  Airway Management Planned: Oral ETT  Additional Equipment: Arterial line, CVP, PA Cath, TEE and 3D TEE  Intra-op Plan:   Post-operative Plan: Post-operative intubation/ventilation  Informed Consent: I have reviewed the patients History and Physical, chart, labs and discussed the procedure including the risks, benefits and alternatives for the proposed anesthesia with the patient or authorized representative who has indicated his/her understanding and acceptance.   Dental advisory given  Plan Discussed with: CRNA,  Anesthesiologist and Surgeon  Anesthesia Plan Comments: (Plan routine monitors, A line, PA catheter, GETA with TEE and post operative ventilation  )      Anesthesia Quick Evaluation

## 2011-08-27 NOTE — Interval H&P Note (Signed)
History and Physical Interval Note:  08/27/2011 7:35 AM  Kathryn Morgan  has presented today for surgery, with the diagnosis of AORTIC STENOSIS  The various methods of treatment have been discussed with the patient and family. After consideration of risks, benefits and other options for treatment, the patient has consented to  Procedure(s) (LRB): MINIMALLY INVASIVE AORTIC VALVE REPLACEMENT (AVR) (Right) as a surgical intervention .  The patients' history has been reviewed, patient examined, no change in status, stable for surgery.  I have reviewed the patients' chart and labs.  Questions were answered to the patient's satisfaction.     Gunnard Dorrance H

## 2011-08-27 NOTE — Progress Notes (Signed)
Spoke with Dr. Cornelius Moras about frequent nausea with vomiting episodes x4 of bile green liquid. Patient received dose of Zofran at 1926. Unable to tolerate Phenergan (has had history of hallucination of body parts being ripped off with past administrations) No new orders received. Applied cool cloth to forehead. Will continue to closely monitor.

## 2011-08-27 NOTE — Progress Notes (Signed)
*  PRELIMINARY RESULTS* Echocardiogram  TEE  has been performed.  Jeryl Columbia R 08/27/2011, 9:22 AM

## 2011-08-27 NOTE — Preoperative (Signed)
Beta Blockers   Reason not to administer Beta Blockers:Not Applicable 

## 2011-08-27 NOTE — OR Nursing (Signed)
Call placed to family (volunteers desk) at removal of retractor at 11:43. Call placed to  [address] at removal of retractor at 11:43; skin closure at  and patient exit. 2nd call to SICU @ 12:11pm

## 2011-08-27 NOTE — Op Note (Addendum)
CARDIOTHORACIC SURGERY OPERATIVE NOTE  Date of Procedure: 08/27/2011  Preoperative Diagnosis:   Severe Aortic Stenosis   Postoperative Diagnosis:   Same   Procedure:   Aortic Valve Replacement  [facility] Ease Pericardial Tissue Valve (size 23mm, model #3300TFX, serial [id number])   Surgeon: Salvatore Decent. Cornelius Moras, MD  Assistant: Lowella Dandy, PA-C  Anesthesia: Jairo Ben, MD   Operative Findings:  Calcific tricuspid aortic valve with severe aortic stenosis  Mild left ventricular systolic dysfunction (EF 45%)  Moderate left ventricular hypertrophy   BRIEF CLINICAL NOTE AND INDICATIONS FOR SURGERY  Patient is a 71 year old married white female from [place], [place] with long-standing history of heart murmur and history of rheumatic fever as a child who was recently discovered to have severe symptomatic aortic stenosis.  The patient describes no history of heart murmur dating back to her childhood. Over the last 3 years she has developed progressive symptoms of exertional shortness of breath. This false symptoms progressed somewhat dramatically until December when she was hospitalized with class IV congestive heart failure.  Echocardiograms performed at that time demonstrated severe aortic stenosis with moderate to severe left ventricular dysfunction. Left and right heart catheterization was performed demonstrating normal coronary artery anatomy with no significant coronary artery disease. Gradients measured the time of catheterization suggested that the patient's aortic stenosis was perhaps moderate in severity and the patient was treated medically. She has been seen in followup and repeat transthoracic echocardiogram suggested severe aortic stenosis.  Dobutamine stress echocardiogram was performed confirming the presence of severe aortic stenosis with increased gradient during stress. The patient has been referred to consider elective aortic valve replacement.  The  patient has been seen in consultation and counseled at length regarding the indications, risks and potential benefits of surgery.  All questions have been answered, and the patient provides full informed consent for the operation as described.     DETAILS OF THE OPERATIVE PROCEDURE  The patient is brought to the operating room on the above mentioned date and central monitoring was established by the anesthesia team including placement of Swan-Ganz catheter and radial arterial line. The patient is placed in the supine position on the operating table.  Intravenous antibiotics are administered. General endotracheal anesthesia is induced uneventfully. A Foley catheter is placed.  Baseline transesophageal echocardiogram was performed.  Findings were notable for calcific aortic valve with severe aortic stenosis.  There was moderate LVH with mild LV systolic dysfunction, EF 45%.  The patient's chest, abdomen, both groins, and both lower extremities are prepared and draped in a sterile manner. A time out procedure is performed.  A median sternotomy incision was performed and the pericardium is opened. The ascending aorta is normal in appearance. The ascending aorta and the right atrium are cannulated for cardioplegia bypass.  Adequate heparinization is verified.   Attempts at placement of a retrograde cardioplegia cannula into the coronary sinus are unsuccessful.   The entire pre-bypass portion of the operation was notable for stable hemodynamics.  Cardiopulmonary bypass was begun and the surface of the heart is inspected.  A cardioplegia cannula is placed in the ascending aorta.  A temperature probe was placed in the interventricular septum.  The patient is cooled to 32C systemic temperature.  The aortic cross clamp is applied and cold blood cardioplegia is delivered initially in an antegrade fashion through the aortic root.  Iced saline slush is applied for topical hypothermia.  The initial  cardioplegic arrest is rapid with early diastolic arrest.  Repeat doses of  cardioplegia are administered intermittently throughout the entire cross clamp portion of the operation using hand held cannulas directly into the left main and right coronary arteries in order to maintain completely flat electrocardiogram and septal myocardial temperature below 15C.  Myocardial protection was felt to be excellent.  An oblique transverse aortotomy incision was performed.  The aortic valve was inspected and notable for tricuspid native valve with severe aortic stenosis.  The aortic valve leaflets were excised sharply and the aortic annulus decalcified.  Decalcification was notably straightforward.  The aortic annulus was sized to accept a 23 mm prosthesis.  The aortic root and left ventricle were irrigated with copious cold saline solution.  Aortic valve replacement was performed using interrupted horizontal mattress 2-0 Ethibond pledgeted sutures with pledgets in the subannular position.  An [facility] Ease pericardial tissue valve (size 23 mm, model # 3300TFX, serial # [id number]) was implanted uneventfully. The valve seated appropriately with adequate space beneath the left main and right coronary artery.  The aortotomy was closed using a 2-layer closure of running 4-0 Prolene suture.  All air was evacuated through the aortic root.  The aortic cross clamp was removed after a total cross clamp time of 71 minutes.  Epicardial pacing wires are fixed to the right ventricular outflow tract and to the right atrial appendage. The patient is rewarmed to 37C temperature. The aortic and left ventricular vents are removed.  The patient is weaned and disconnected from cardiopulmonary bypass.  The patient's rhythm at separation from bypass was AV paced.  The patient was weaned from cardioplegic bypass without any inotropic support. Total cardiopulmonary bypass time for the operation was 101 minutes.  Followup transesophageal  echocardiogram performed after separation from bypass revealed a well-seated aortic valve prosthesis that was functioning normally and without any sign of perivalvular leak.  Left ventricular function was unchanged from preoperatively.  The aortic and venous cannula were removed uneventfully. Protamine was administered to reverse the anticoagulation. The mediastinum and pleural space were inspected for hemostasis and irrigated with saline solution. The mediastinum was drained using 2 chest tubes placed through separate stab incisions inferiorly.  The soft tissues anterior to the aorta were reapproximated loosely. The sternum is closed with double strength sternal wire. The soft tissues anterior to the sternum were closed in multiple layers and the skin is closed with a running subcuticular skin closure.   The post-bypass portion of the operation was notable for stable rhythm and hemodynamics.   No blood products were administered during the operation.  The patient tolerated the procedure well and is transported to the surgical intensive care in stable condition. There are no intraoperative complications. All sponge instrument and needle counts are verified correct at completion of the operation.    Salvatore Decent. Cornelius Moras MD 08/27/2011 12:27 PM

## 2011-08-27 NOTE — Progress Notes (Addendum)
[facility] BRIEF SICU PROGRESS NOTE  Day of Surgery  S/P Procedure(s) (LRB): AORTIC VALVE REPLACEMENT (AVR) (N/A)   Extubated uneventfully Stable hemodynamics Chest tube output low UOP excellent Labs okay  Plan: Continue routine early postop  OWEN,CLARENCE H 08/27/2011 5:17 PM

## 2011-08-28 ENCOUNTER — Inpatient Hospital Stay (HOSPITAL_COMMUNITY): Payer: Medicare Other

## 2011-08-28 ENCOUNTER — Other Ambulatory Visit: Payer: Self-pay

## 2011-08-28 LAB — POCT I-STAT, CHEM 8
BUN: 16 mg/dL (ref 6–23)
Chloride: 102 mEq/L (ref 96–112)
HCT: 29 % — ABNORMAL LOW (ref 36.0–46.0)
Potassium: 4.4 mEq/L (ref 3.5–5.1)
Sodium: 142 mEq/L (ref 135–145)

## 2011-08-28 LAB — GLUCOSE, CAPILLARY
Glucose-Capillary: 128 mg/dL — ABNORMAL HIGH (ref 70–99)
Glucose-Capillary: 129 mg/dL — ABNORMAL HIGH (ref 70–99)
Glucose-Capillary: 145 mg/dL — ABNORMAL HIGH (ref 70–99)
Glucose-Capillary: 155 mg/dL — ABNORMAL HIGH (ref 70–99)
Glucose-Capillary: 165 mg/dL — ABNORMAL HIGH (ref 70–99)
Glucose-Capillary: 266 mg/dL — ABNORMAL HIGH (ref 70–99)
Glucose-Capillary: 266 mg/dL — ABNORMAL HIGH (ref 70–99)

## 2011-08-28 LAB — CBC
HCT: 29.4 % — ABNORMAL LOW (ref 36.0–46.0)
Hemoglobin: 9.3 g/dL — ABNORMAL LOW (ref 12.0–15.0)
MCH: 26.8 pg (ref 26.0–34.0)
MCH: 26.1 pg (ref 26.0–34.0)
MCHC: 33 g/dL (ref 30.0–36.0)
MCV: 81.2 fL (ref 78.0–100.0)
MCV: 81.5 fL (ref 78.0–100.0)
RBC: 3.56 MIL/uL — ABNORMAL LOW (ref 3.87–5.11)
RDW: 21.6 % — ABNORMAL HIGH (ref 11.5–15.5)

## 2011-08-28 LAB — BASIC METABOLIC PANEL
BUN: 15 mg/dL (ref 6–23)
Calcium: 8.6 mg/dL (ref 8.4–10.5)
Creatinine, Ser: 0.68 mg/dL (ref 0.50–1.10)
GFR calc Af Amer: >90 mL/min (ref >90)
GFR calc non Af Amer: 87 mL/min — ABNORMAL LOW (ref >90)

## 2011-08-28 MED ORDER — METOPROLOL TARTRATE 25 MG/10 ML ORAL SUSPENSION
25.0000 mg | Freq: Two times a day (BID) | ORAL | Status: DC
  Filled 2011-08-28: qty 10

## 2011-08-28 MED ORDER — SODIUM CHLORIDE 0.9 % IJ SOLN
10.0000 mL | Freq: Two times a day (BID) | INTRAMUSCULAR | Status: DC
  Administered 2011-08-28: 13 mL
  Administered 2011-08-29: 10 mL

## 2011-08-28 MED ORDER — FUROSEMIDE 10 MG/ML IJ SOLN
20.0000 mg | Freq: Four times a day (QID) | INTRAMUSCULAR | Status: AC
  Administered 2011-08-28 (×3): 20 mg via INTRAVENOUS
  Filled 2011-08-28 (×3): qty 2

## 2011-08-28 MED ORDER — METOPROLOL TARTRATE 25 MG PO TABS
25.0000 mg | ORAL_TABLET | Freq: Two times a day (BID) | ORAL | Status: DC
  Filled 2011-08-28: qty 1

## 2011-08-28 MED ORDER — INSULIN GLARGINE 100 UNIT/ML ~~LOC~~ SOLN
20.0000 [IU] | Freq: Two times a day (BID) | SUBCUTANEOUS | Status: DC
  Administered 2011-08-28 – 2011-08-29 (×3): 20 [IU] via SUBCUTANEOUS

## 2011-08-28 MED ORDER — METOCLOPRAMIDE HCL 5 MG/ML IJ SOLN
5.0000 mg | Freq: Four times a day (QID) | INTRAMUSCULAR | Status: DC | PRN
  Administered 2011-08-28: 5 mg via INTRAVENOUS
  Filled 2011-08-28: qty 1

## 2011-08-28 MED ORDER — PANTOPRAZOLE SODIUM 40 MG PO TBEC
40.0000 mg | DELAYED_RELEASE_TABLET | Freq: Every day | ORAL | Status: DC
  Administered 2011-08-28: 40 mg via ORAL
  Filled 2011-08-28 (×2): qty 1

## 2011-08-28 MED ORDER — CARVEDILOL 12.5 MG PO TABS
12.5000 mg | ORAL_TABLET | Freq: Two times a day (BID) | ORAL | Status: DC
  Administered 2011-08-28 – 2011-08-31 (×7): 12.5 mg via ORAL
  Filled 2011-08-28 (×9): qty 1

## 2011-08-28 MED ORDER — SODIUM CHLORIDE 0.9 % IJ SOLN: 10.0000 mL | INTRAMUSCULAR | Status: DC | PRN

## 2011-08-28 MED ORDER — INSULIN ASPART 100 UNIT/ML ~~LOC~~ SOLN
0.0000 [IU] | SUBCUTANEOUS | Status: DC
  Administered 2011-08-28: 8 [IU] via SUBCUTANEOUS

## 2011-08-28 NOTE — Progress Notes (Signed)
   CARDIOTHORACIC SURGERY PROGRESS NOTE   R1 Day Post-Op Procedure(s) (LRB): AORTIC VALVE REPLACEMENT (AVR) (N/A)  Subjective: Looks good.  Mild soreness in chest.  Objective: Vital signs: BP Readings from Last 1 Encounters:  08/28/11 139/62   Pulse Readings from Last 1 Encounters:  08/28/11 94   Resp Readings from Last 1 Encounters:  08/28/11 23   Temp Readings from Last 1 Encounters:  08/28/11 98.8 F (37.1 C) Core (Comment)    Hemodynamics: PAP: (28-55)/(15-28) 38/16 mmHg CO:  [2.9 L/min-5.1 L/min] 4.5 L/min CI:  [1.5 L/min/m2-2.7 L/min/m2] 2.4 L/min/m2  Physical Exam:  Rhythm:   sinus  Breath sounds: clear  Heart sounds:  RRR  Incisions:  Dressings dry  Abdomen:  soft  Extremities:  warm   Intake/Output from previous day: 03/22 0701 - 03/23 0700 In: 5765.3 [I.V.:3960.3; Blood:400; IV Piggyback:1405] Out: 4098 [Urine:6905; Chest Tube:282] Intake/Output this shift: Total I/O In: 155 [I.V.:155] Out: 275 [Urine:275]  Lab Results:  Basename 08/28/11 0400 08/27/11 2002 08/27/11 2000  WBC 15.6* -- 11.7*  HGB 9.7* 9.5* --  HCT 29.4* 28.0* --  PLT 126* -- 119*   BMET:  Basename 08/28/11 0400 08/27/11 2002  NA 144 146*  K 4.2 4.5  CL 107 107  CO2 29 --  GLUCOSE 169* 131*  BUN 15 17  CREATININE 0.68 0.80  CALCIUM 8.6 --    CBG (last 3)   Basename 08/28/11 0748 08/27/11 2349 08/27/11 0547  GLUCAP 165* 148* 201*   ABG    Component Value Date/Time   PHART 7.393 08/27/2011 1639   HCO3 27.6* 08/27/2011 1639   TCO2 27 08/27/2011 2002   ACIDBASEDEF 3.5* 06/04/2008 0914   O2SAT 99.0 08/27/2011 1639   CXR: clear  Assessment/Plan: S/P Procedure(s) (LRB): AORTIC VALVE REPLACEMENT (AVR) (N/A)  Doing well POD1 Expected post op acute blood loss anemia, mild, stable Expected post op volume excess, mild DMII with stable glycemic control   Mobilize  D/C tubes and lines  Increase beta blocker  Start lasix  Add lantus insulin   Chairty Toman  H 08/28/2011 10:16 AM

## 2011-08-28 NOTE — Progress Notes (Signed)
1630 Blood glucose 287. Resumed insulin drip per stabilizer protocol.

## 2011-08-29 ENCOUNTER — Encounter (HOSPITAL_COMMUNITY): Payer: Self-pay | Admitting: *Deleted

## 2011-08-29 ENCOUNTER — Inpatient Hospital Stay (HOSPITAL_COMMUNITY): Payer: Medicare Other

## 2011-08-29 LAB — BASIC METABOLIC PANEL
Chloride: 104 mEq/L (ref 96–112)
Creatinine, Ser: 0.69 mg/dL (ref 0.50–1.10)
GFR calc Af Amer: >90 mL/min (ref >90)
GFR calc non Af Amer: 87 mL/min — ABNORMAL LOW (ref >90)
Potassium: 3.8 mEq/L (ref 3.5–5.1)

## 2011-08-29 LAB — GLUCOSE, CAPILLARY
Glucose-Capillary: 100 mg/dL — ABNORMAL HIGH (ref 70–99)
Glucose-Capillary: 105 mg/dL — ABNORMAL HIGH (ref 70–99)
Glucose-Capillary: 107 mg/dL — ABNORMAL HIGH (ref 70–99)
Glucose-Capillary: 118 mg/dL — ABNORMAL HIGH (ref 70–99)
Glucose-Capillary: 91 mg/dL (ref 70–99)

## 2011-08-29 LAB — CBC
HCT: 27.7 % — ABNORMAL LOW (ref 36.0–46.0)
Hemoglobin: 8.8 g/dL — ABNORMAL LOW (ref 12.0–15.0)
RDW: 22.2 % — ABNORMAL HIGH (ref 11.5–15.5)
WBC: 13.1 10*3/uL — ABNORMAL HIGH (ref 4.0–10.5)

## 2011-08-29 MED ORDER — SODIUM CHLORIDE 0.9 % IJ SOLN: 3.0000 mL | INTRAMUSCULAR | Status: DC | PRN

## 2011-08-29 MED ORDER — POTASSIUM CHLORIDE CRYS ER 20 MEQ PO TBCR
20.0000 meq | EXTENDED_RELEASE_TABLET | Freq: Every day | ORAL | Status: DC
  Administered 2011-08-30 – 2011-08-31 (×2): 20 meq via ORAL
  Filled 2011-08-29 (×2): qty 1

## 2011-08-29 MED ORDER — PANTOPRAZOLE SODIUM 40 MG PO TBEC
40.0000 mg | DELAYED_RELEASE_TABLET | Freq: Two times a day (BID) | ORAL | Status: DC
  Administered 2011-08-29 – 2011-08-31 (×4): 40 mg via ORAL
  Filled 2011-08-29 (×3): qty 1

## 2011-08-29 MED ORDER — INSULIN ASPART 100 UNIT/ML ~~LOC~~ SOLN
0.0000 [IU] | Freq: Three times a day (TID) | SUBCUTANEOUS | Status: DC
  Administered 2011-08-29: 2 [IU] via SUBCUTANEOUS
  Administered 2011-08-29 – 2011-08-30 (×2): 8 [IU] via SUBCUTANEOUS
  Administered 2011-08-30: 4 [IU] via SUBCUTANEOUS
  Administered 2011-08-30: 2 [IU] via SUBCUTANEOUS
  Administered 2011-08-30: 8 [IU] via SUBCUTANEOUS
  Administered 2011-08-31: 2 [IU] via SUBCUTANEOUS
  Administered 2011-08-31: 4 [IU] via SUBCUTANEOUS

## 2011-08-29 MED ORDER — PANTOPRAZOLE SODIUM 40 MG PO TBEC: 40.0000 mg | DELAYED_RELEASE_TABLET | Freq: Two times a day (BID) | ORAL | Status: DC

## 2011-08-29 MED ORDER — SODIUM CHLORIDE 0.9 % IV SOLN: 250.0000 mL | INTRAVENOUS | Status: DC | PRN

## 2011-08-29 MED ORDER — TIOTROPIUM BROMIDE MONOHYDRATE 18 MCG IN CAPS
18.0000 ug | ORAL_CAPSULE | Freq: Every day | RESPIRATORY_TRACT | Status: DC
  Administered 2011-08-31: 18 ug via RESPIRATORY_TRACT
  Filled 2011-08-29 (×3): qty 5

## 2011-08-29 MED ORDER — INSULIN GLARGINE 100 UNIT/ML ~~LOC~~ SOLN
30.0000 [IU] | Freq: Two times a day (BID) | SUBCUTANEOUS | Status: DC
  Administered 2011-08-29: 30 [IU] via SUBCUTANEOUS
  Administered 2011-08-29: 10 [IU] via SUBCUTANEOUS
  Administered 2011-08-30 – 2011-08-31 (×3): 30 [IU] via SUBCUTANEOUS

## 2011-08-29 MED ORDER — SIMVASTATIN 20 MG PO TABS
20.0000 mg | ORAL_TABLET | Freq: Every day | ORAL | Status: DC
  Administered 2011-08-29 – 2011-08-30 (×2): 20 mg via ORAL
  Filled 2011-08-29 (×3): qty 1

## 2011-08-29 MED ORDER — INSULIN ASPART 100 UNIT/ML ~~LOC~~ SOLN
6.0000 [IU] | Freq: Three times a day (TID) | SUBCUTANEOUS | Status: DC
  Administered 2011-08-29 – 2011-08-30 (×4): 6 [IU] via SUBCUTANEOUS

## 2011-08-29 MED ORDER — FUROSEMIDE 10 MG/ML IJ SOLN
40.0000 mg | Freq: Two times a day (BID) | INTRAMUSCULAR | Status: AC
  Administered 2011-08-29 – 2011-08-30 (×2): 40 mg via INTRAVENOUS
  Filled 2011-08-29 (×2): qty 4

## 2011-08-29 MED ORDER — FLUTICASONE-SALMETEROL 250-50 MCG/DOSE IN AEPB
1.0000 | INHALATION_SPRAY | Freq: Two times a day (BID) | RESPIRATORY_TRACT | Status: DC
  Administered 2011-08-30 – 2011-08-31 (×3): 1 via RESPIRATORY_TRACT
  Filled 2011-08-29 (×2): qty 14

## 2011-08-29 MED ORDER — GUAIFENESIN ER 600 MG PO TB12
1200.0000 mg | ORAL_TABLET | Freq: Two times a day (BID) | ORAL | Status: DC
  Administered 2011-08-29 – 2011-08-31 (×5): 1200 mg via ORAL
  Filled 2011-08-29 (×6): qty 2

## 2011-08-29 MED ORDER — POTASSIUM CHLORIDE 10 MEQ/50ML IV SOLN
10.0000 meq | INTRAVENOUS | Status: AC
  Administered 2011-08-29 (×4): 10 meq via INTRAVENOUS
  Filled 2011-08-29: qty 200

## 2011-08-29 MED ORDER — MOVING RIGHT ALONG BOOK
Freq: Once | Status: AC
  Administered 2011-08-29: 16:00:00
  Filled 2011-08-29: qty 1

## 2011-08-29 MED ORDER — SODIUM CHLORIDE 0.9 % IJ SOLN
3.0000 mL | Freq: Two times a day (BID) | INTRAMUSCULAR | Status: DC
  Administered 2011-08-29 – 2011-08-31 (×5): 3 mL via INTRAVENOUS

## 2011-08-29 MED ORDER — SPIRONOLACTONE 12.5 MG HALF TABLET
12.5000 mg | ORAL_TABLET | Freq: Every day | ORAL | Status: DC
  Administered 2011-08-29 – 2011-08-31 (×3): 12.5 mg via ORAL
  Filled 2011-08-29 (×3): qty 1

## 2011-08-29 MED ORDER — FUROSEMIDE 40 MG PO TABS
40.0000 mg | ORAL_TABLET | Freq: Every day | ORAL | Status: DC
  Administered 2011-08-31: 40 mg via ORAL
  Filled 2011-08-29 (×2): qty 1

## 2011-08-29 NOTE — Progress Notes (Signed)
Report to 2000 RN 

## 2011-08-29 NOTE — Progress Notes (Signed)
Patient transferred by wheelchair on 2L oxygen and cardiac monitor.  Received in 2016 by staff and placed in bed.

## 2011-08-29 NOTE — Progress Notes (Signed)
   CARDIOTHORACIC SURGERY PROGRESS NOTE   R2 Days Post-Op Procedure(s) (LRB): AORTIC VALVE REPLACEMENT (AVR) (N/A)  Subjective: Feels better.  Looks good.  Didn't sleep any last night.  Objective: Vital signs: BP Readings from Last 1 Encounters:  08/29/11 125/52   Pulse Readings from Last 1 Encounters:  08/29/11 86   Resp Readings from Last 1 Encounters:  08/29/11 17   Temp Readings from Last 1 Encounters:  08/29/11 97.8 F (36.6 C) Oral    Hemodynamics:    Physical Exam:  Rhythm:   sinus  Breath sounds: clear  Heart sounds:  RRR  Incisions:  Clean and dry  Abdomen:  soft  Extremities:  warm   Intake/Output from previous day: 03/23 0701 - 03/24 0700 In: 2022.7 [P.O.:1080; I.V.:836.7; IV Piggyback:106] Out: 2180 [Urine:2180] Intake/Output this shift: Total I/O In: 225.6 [P.O.:100; I.V.:125.6] Out: 180 [Urine:180]  Lab Results:  Basename 08/29/11 0355 08/28/11 1614  WBC 13.1* 14.9*  HGB 8.8* 9.3*  HCT 27.7* 29.0*  PLT 102* 117*   BMET:  Basename 08/29/11 0355 08/28/11 1614 08/28/11 1606 08/28/11 0400  NA 141 -- 142 --  K 3.8 -- 4.4 --  CL 104 -- 102 --  CO2 32 -- -- 29  GLUCOSE 90 -- 287* --  BUN 15 -- 16 --  CREATININE 0.69 0.77 -- --  CALCIUM 8.6 -- -- 8.6    CBG (last 3)   Basename 08/29/11 1003 08/29/11 0921 08/29/11 0820  GLUCAP 100* 106* 104*   ABG    Component Value Date/Time   PHART 7.393 08/27/2011 1639   HCO3 27.6* 08/27/2011 1639   TCO2 30 08/28/2011 1606   ACIDBASEDEF 3.5* 06/04/2008 0914   O2SAT 99.0 08/27/2011 1639   CXR: clear  Assessment/Plan: S/P Procedure(s) (LRB): AORTIC VALVE REPLACEMENT (AVR) (N/A)  Doing well POD2 Expected post op acute blood loss anemia, mild, stable Expected post op volume excess, mild, diuresing Type II diabetes mellitus, glycemic control excellent but still on insulin drip   Mobilize  Diuresis  Increase lantus and add meal coverage to sliding scale  Transfer step  down  Tianna Baus H 08/29/2011 10:48 AM

## 2011-08-29 NOTE — Plan of Care (Signed)
Problem: Phase II Progression Outcomes Goal: CBGs/Blood glucose < or equal to 120 Reinforced the use of insulin drip/ lantus and glucose stabilizer for appropriate post-op glucose control; extensive conversations r/t ADA computer program, necessity to check CBG's frequently, when to notify the RN of hypoglycemic s/s ETC; extensive re-assurence provided to Pt r/t apprehension over hypoglycemic events. Will cont' to monitor and assess for cont' verbalized concerns.

## 2011-08-30 ENCOUNTER — Ambulatory Visit: Payer: Medicare Other | Admitting: Cardiology

## 2011-08-30 ENCOUNTER — Encounter (HOSPITAL_COMMUNITY): Payer: Self-pay

## 2011-08-30 ENCOUNTER — Inpatient Hospital Stay (HOSPITAL_COMMUNITY): Payer: Medicare Other

## 2011-08-30 LAB — GLUCOSE, CAPILLARY
Glucose-Capillary: 115 mg/dL — ABNORMAL HIGH (ref 70–99)
Glucose-Capillary: 104 mg/dL — ABNORMAL HIGH (ref 70–99)
Glucose-Capillary: 82 mg/dL (ref 70–99)
Glucose-Capillary: 77 mg/dL (ref 70–99)
Glucose-Capillary: 91 mg/dL (ref 70–99)
Glucose-Capillary: 122 mg/dL — ABNORMAL HIGH (ref 70–99)
Glucose-Capillary: 169 mg/dL — ABNORMAL HIGH (ref 70–99)
Glucose-Capillary: 196 mg/dL — ABNORMAL HIGH (ref 70–99)
Glucose-Capillary: 211 mg/dL — ABNORMAL HIGH (ref 70–99)

## 2011-08-30 LAB — BASIC METABOLIC PANEL
BUN: 21 mg/dL (ref 6–23)
CO2: 33 mEq/L — ABNORMAL HIGH (ref 19–32)
Calcium: 9 mg/dL (ref 8.4–10.5)
Creatinine, Ser: 0.78 mg/dL (ref 0.50–1.10)
Glucose, Bld: 210 mg/dL — ABNORMAL HIGH (ref 70–99)

## 2011-08-30 LAB — CBC
MCH: 26.5 pg (ref 26.0–34.0)
MCV: 83.6 fL (ref 78.0–100.0)
Platelets: 112 10*3/uL — ABNORMAL LOW (ref 150–400)
RBC: 3.36 MIL/uL — ABNORMAL LOW (ref 3.87–5.11)

## 2011-08-30 MED ORDER — OXYCODONE HCL 5 MG PO TABS
5.0000 mg | ORAL_TABLET | ORAL | Status: DC | PRN
  Administered 2011-08-30 – 2011-08-31 (×4): 5 mg via ORAL
  Filled 2011-08-30 (×4): qty 1

## 2011-08-30 MED ORDER — ASPIRIN 325 MG PO TBEC: 325.0000 mg | DELAYED_RELEASE_TABLET | Freq: Every day | ORAL | Status: AC

## 2011-08-30 MED ORDER — OXYCODONE HCL 5 MG PO TABS: 5.0000 mg | ORAL_TABLET | ORAL | Status: AC | PRN

## 2011-08-30 MED ORDER — TRAMADOL HCL 50 MG PO TABS: 50.0000 mg | ORAL_TABLET | ORAL | Status: DC | PRN

## 2011-08-30 MED FILL — Magnesium Sulfate Inj 50%: INTRAMUSCULAR | Qty: 10 | Status: AC

## 2011-08-30 MED FILL — Potassium Chloride Inj 2 mEq/ML: INTRAVENOUS | Qty: 40 | Status: AC

## 2011-08-30 NOTE — Progress Notes (Signed)
CARDIAC REHAB PHASE I   PRE:  Rate/Rhythm: 91 SR    BP: sitting 124/60    SaO2: 98 2L, 86 RA, 94 2L  MODE:  Ambulation: 350 ft   POST:  Rate/Rhythm: 114 ST    BP: sitting 100/60     SaO2: 94 2L  SaO2 86 RA with lightheadedness after being off 2L for 5 min. Reapplied and SaO2 increased to 94 2L. Pt difficult to stand from low chair. Had been in it since 4 am. Attempted x2 with assist x1 and rocking. Pt unable to stand. Then stood pt with assist x2 and gait belt. Able to walk steady assist x1 rest of walk on 2L O2 with RW. Requests RW for home. Pt admits to increased fatigue to day. To recliner after walk. Will f/u. [phone]  Harriet Masson CES, ACSM

## 2011-08-30 NOTE — Progress Notes (Addendum)
Subjective:   Kathryn Morgan complains of incisional pain this AM.  She asked if she could have something stronger than Ultram. The patient also questioned when she may be able to be discharged home   Objective:  Vital Signs in the last 24 hours: Temp:  [97.8 F (36.6 C)-98.3 F (36.8 C)] 98.3 F (36.8 C) (03/25 0500) Pulse Rate:  [85-99] 99  (03/25 0500) Resp:  [13-22] 19  (03/25 0500) BP: (112-134)/(52-71) 124/71 mmHg (03/25 0500) SpO2:  [90 %-96 %] 94 % (03/25 0500) Weight:  [199 lb (90.266 kg)] 199 lb (90.266 kg) (03/25 0500)  Intake/Output from previous day: 03/24 0701 - 03/25 0700 In: 535.6 [P.O.:220; I.V.:165.6; IV Piggyback:150] Out: 1565 [Urine:1565] Intake/Output from this shift: Total I/O In: 480 [P.O.:480] Out: -   Physical Exam: General appearance: alert and no distress Lungs: clear to auscultation bilaterally Heart: regular rate and rhythm, S1, S2 normal, no murmur, click, rub or gallop Abdomen: soft, non-tender; bowel sounds normal; no masses,  no organomegaly Extremities: edema 1+ Skin: incision healing well, no erythema or drainage present  Lab Results:  [facility] 08/30/11 0605 08/29/11 0355  WBC 12.1* 13.1*  HGB 8.9* 8.8*  PLT 112* 102*    Basename 08/30/11 0605 08/29/11 0355  NA 138 141  K 4.3 3.8  CL 100 104  CO2 33* 32  GLUCOSE 210* 90  BUN 21 15  CREATININE 0.78 0.69   No results found for this basename: TROPONINI:2,CK,MB:2 in the last 72 hours Hepatic Function Panel No results found for this basename: PROT,ALBUMIN,AST,ALT,ALKPHOS,BILITOT,BILIDIR,IBILI in the last 72 hours No results found for this basename: CHOL in the last 72 hours No results found for this basename: PROTIME in the last 72 hours    Assessment/Plan:   1. S/P AVR doing well. 2. CV- patient in NSR, blood pressure and heart rate controlled, on Coreg 12.5mg  daily 3. DM- CBGs- well controlled, will continue insulin regimen 4. Volume Overload- will continue diuresis 5.  Resp- no shortness of breath, will wean oxygen as tolerated 6. Dispo- patient is doing well, if able to wean off oxygen and obtain pain control will likely be able to dc in 1-2 days    LOS: 3 days    BARRETT, ERIN 08/30/2011, 9:12 AM    I have seen and examined the patient and agree with the assessment and plan as outlined.  Purcell Nails 08/30/2011 6:47 PM

## 2011-08-30 NOTE — Discharge Summary (Addendum)
Physician Discharge Summary  Patient ID: Kathryn Morgan MRN: [id number] DOB/AGE: 10/24/1940 71 y.o.  Admit date: 08/27/2011 Discharge date: 08/30/2011  Admission Diagnoses:  1. Aortic Stenosis 2. Systolic Heart Failure, chronic 3. COPD 4. DM 5. Hyperlipidemia 6. Hypertension 7. Obesity  Discharge Diagnoses:    *S/P aortic valve replacement  1. Aortic Stenosis 2. Systolic Heart Failure, chronic 3. COPD 4. DM 5. Hyperlipidemia 6. Hypertension 7. Obesity  Discharged Condition: good  History of Present Illness:  [name] is a 71 yo female with a long history of a heart murmur with rheumatic fever as a child who was recently diagnosed with severe aortic stenosis.  The patient has developed worsening symptoms of exertional shortness of breath resulting in hospitalization in December with an exacerbation of congestive heart failure which was classified as stage IV.  She underwent Echocardiography during that admission which revealed severe aortic stenosis with moderate to severe left ventricular dysfunction.  The patients valve area was 0.51cm squared and a peak gradient of .  She also underwent cardiac catheterization which revealed normal coronaries an aortic valve area of 1.2 cm squared and a peak gradient of 24 mmHg.  At that time based on the catheterization findings it was felt the patient's aortic stenosis was moderate in severity and she was treated with medical management.  She has been routinely followed by Cardiology who has repeated Echocardiograms which are still suggestive of severe aortic stenosis.  It was therefore felt the patient would benefit from undergoing a Dobutamine stress echocardiogram which confirmed the presence of severe aortic stenosis with a peak gradient suggestive of severe aortic stenosis. She was subsequently referred to Dr. Cornelius Moras for evaluation for elective Aortic Valve Replacement.  Dr. Cornelius Moras evaluated [name] on 08/23/2011 and discussed in  depth the risks and benefits of an Aortic Valve Replacement.  The patient was comfortable with the risk and wished to proceed with surgery which was scheduled for 08/27/2011.    Hospital Course:   Mrs. Minahan presented to [facility] on 08/27/2011 and underwent an Aortic Valve Replacement utilizing a 23mm Edwards Pericardial Magna-Ease valve conduit.  The patient tolerated the procedure well and was taken to the surgical ICU in stable condition.  POD #0 the patient was extubated without difficulty.  POD #1 the patient's chest tubes and lines were removed without difficulty.  The patient was placed on Lantus for additional hyperglycemic control.  POD #2 the patient was doing well.  She was transferred to step down in stable condition.  POD #3 the patient is doing very well.  She is questioning when she will be able to be discharged home.  She remained on oxygen this morning with good air movement bilaterally.  We will wean off oxygen as tolerated.  The patient is ambulating and tolerating her diet.  The patient has not had a bowel movement since surgery.  We will continue to monitor and if necessary will prescribe a laxative.  Her final CXR still reveals some atelectasis and small bilateral pleural effusions.  Kathryn Morgan is doing very well. She is experiencing hypoxia on ambulation.  We will arrange for her to be discharged home on 2L of oxygen.  She will be discharged home today Discharge Exam: Blood pressure 112/64, pulse 89, temperature 98.2 F (36.8 C), temperature source Oral, resp. rate 18, height 5\' 1"  (1.549 m), weight 201 lb 15.1 oz (91.6 kg), SpO2 95.00%.   General appearance: alert and no distress Resp: clear to auscultation bilaterally Cardio:  regular rate and rhythm, S1, S2 normal, no murmur, click, rub or gallop GI: soft, non-tender; bowel sounds normal; no masses,  no organomegaly Extremities: edema 1+ Skin: incision healing well, no erythema or drainage present  Disposition:  01-Home or Self Care  Discharge Orders    Future Appointments: Provider: Department: Dept Phone: Center:   09/20/2011 3:30 PM [facility] [place] [phone] [facility]     Medication List  As of 08/30/2011 10:57 AM   TAKE these medications         ADVAIR DISKUS 250-50 MCG/DOSE Aepb   Generic drug: Fluticasone-Salmeterol   Inhale 1 puff into the lungs every 12 (twelve) hours.      alendronate 35 MG tablet   Commonly known as: FOSAMAX   Take 70 mg by mouth every 7 (seven) days.      aspirin 325 MG EC tablet   Take 1 tablet (325 mg total) by mouth daily.      carvedilol 12.5 MG tablet   Commonly known as: COREG   Take 1 tablet (12.5 mg total) by mouth 2 (two) times daily with a meal.      cetirizine 10 MG tablet   Commonly known as: ZYRTEC   Take 10 mg by mouth daily.      docusate sodium 100 MG capsule   Commonly known as: COLACE   Take 100 mg by mouth 2 (two) times daily.      furosemide 20 MG tablet   Commonly known as: LASIX   Take 40 mg by mouth daily.      guaiFENesin 600 MG 12 hr tablet   Commonly known as: MUCINEX   Take 1,200 mg by mouth 2 (two) times daily.      HUMALOG KWIKPEN [place]   Inject 10-16 Units into the skin 3 (three) times daily with meals. Per sliding scale      HUMULIN R 100 units/mL injection   Generic drug: insulin regular      insulin detemir 100 UNIT/ML injection   Commonly known as: LEVEMIR   Inject 44 Units into the skin at bedtime.      mulitivitamin with minerals Tabs   Take 1 tablet by mouth daily.      oxyCODONE 5 MG immediate release tablet   Commonly known as: Oxy IR/ROXICODONE   Take 1 tablet (5 mg total) by mouth every 4 (four) hours as needed.      pantoprazole 40 MG tablet   Commonly known as: PROTONIX   Take 1 tablet (40 mg total) by mouth 2 (two) times daily before a meal.      PROBIOTIC FORMULA PO   Take 1 tablet by mouth daily.      simvastatin 20 MG tablet   Commonly known as: ZOCOR   Take 20 mg by mouth at  bedtime.      sodium chloride 0.65 % nasal spray   Commonly known as: OCEAN   Place 1 spray into the nose daily as needed. For dryness      spironolactone 25 MG tablet   Commonly known as: ALDACTONE   Take 12.5 mg by mouth daily.      tiotropium 18 MCG inhalation capsule   Commonly known as: SPIRIVA   Place 18 mcg into inhaler and inhale daily.      traMADol 50 MG tablet   Commonly known as: ULTRAM   Take 1-2 tablets (50-100 mg total) by mouth every 4 (four) hours as needed.  Vitamin D (Ergocalciferol) 50000 UNITS Caps   Commonly known as: DRISDOL   Take 50,000 Units by mouth every 7 (seven) days. For 12 weeks           Follow-up Information    Follow up with Purcell Nails, MD in 3 weeks. (Please have CXR performed 45 min prior to appointemt)    Contact information:   [address] [place] [address] [phone]       Follow up with Nona Dell, MD in 2 weeks.   Contact information:   [address] [place] [address] [phone]          Signed: Lowella Dandy 08/30/2011, 10:57 AM

## 2011-08-30 NOTE — Anesthesia Postprocedure Evaluation (Signed)
  Anesthesia Post-op Note  Patient: Kathryn Morgan  Procedure(s) Performed: Procedure(s) (LRB): AORTIC VALVE REPLACEMENT (AVR) (N/A)  Patient Location: PACU and Nursing Unit  Anesthesia Type: General  Level of Consciousness: awake, alert  and oriented  Airway and Oxygen Therapy: Patient Spontanous Breathing and Patient connected to nasal cannula oxygen  Post-op Pain: none  Post-op Assessment: Post-op Vital signs reviewed, Patient's Cardiovascular Status Stable, Respiratory Function Stable, Patent Airway, No signs of Nausea or vomiting, Adequate PO intake and Pain level controlled  Post-op Vital Signs: Reviewed and stable  Complications: No apparent anesthesia complications, patient very satisfied with anesthetic care

## 2011-08-30 NOTE — Progress Notes (Signed)
Inpatient Diabetes Program Recommendations  AACE/ADA: New Consensus Statement on Inpatient Glycemic Control  Target Ranges:  Prepandial:   less than 140 mg/dL      Peak postprandial:   less than 180 mg/dL (1-2 hours)      Critically ill patients:  140 - 180 mg/dL  Pager:  [phone] Hours:  8 am-10pm   Reason for Visit: Elevated glucose:  Results for Kathryn Morgan, Kathryn Morgan (MRN [id number]) as of 08/30/2011 12:44  Ref. Range 08/29/2011 17:45 08/29/2011 22:05 08/30/2011 06:21 08/30/2011 11:23  Glucose-Capillary Latest Range: 70-99 mg/dL 147 (H) 829 (H) 562 (H) 211 (H)     Inpatient Diabetes Program Recommendations Insulin - Meal Coverage: Increase meal coverage to Novolog 10 units TID

## 2011-08-30 NOTE — Progress Notes (Signed)
   CARE MANAGEMENT NOTE 08/30/2011  Patient:  Kathryn Morgan, Kathryn Morgan   Account Number:  [id number]  Date Initiated:  08/30/2011  Documentation initiated by:  Denis Carreon  Subjective/Objective Assessment:   PT S/P AVR ON 08/27/11.  PTA, PT INDEPENDENT, LIVES WITH SPOUSE.     Action/Plan:   MET WITH PT TO DISCUSS DC PLANS.  PT STATES HUSBAND AND DAUGHTER TO PROVIDE 24HR CARE AT DISCHARGE.   Anticipated DC Date:  09/02/2011   Anticipated DC Plan:  HOME W HOME HEALTH SERVICES      DC Planning Services  CM consult      [facility] Choice  HOME HEALTH   Choice offered to / List presented to:  C-1 Patient   DME arranged  Levan Hurst      DME agency  [facility]     [facility] arranged  HH-1 RN  HH-2 PT      [facility] agency  [facility]   Status of service:  In process, will continue to follow Medicare Important Message given?   (If response is "NO", the following Medicare IM given date fields will be blank) Date Medicare IM given:   Date Additional Medicare IM given:    Discharge Disposition:  HOME W HOME HEALTH SERVICES  Per UR Regulation:    If discussed at Long Length of Stay Meetings, dates discussed:    Comments:  08/30/11 Zaelyn Barbary,RN,BSN 1110 MET WITH PT AND DAUGHTER TO DISCUSS DC PLANS.  PT REQUESTS RW FOR HOME; STATES SHE HAS HAD HOME HEALTH AFTER LAST HOSPITAL ADMISSION, AND WISHES TO USE [facility] FOR HOME HEALTH NEEDS AGAIN.  WILL ARRANGE HHRN FOR RESTORATIVE CARE AND HHPT, AS PT WOULD BENEFIT FROM CONT THERAPY AT HOME.  WILL FOLLOW FOR ADDITONAL NEEDS. Phone #[phone]

## 2011-08-30 NOTE — Progress Notes (Signed)
Patient walked 22ft with rolling walker on 2 liters 02 with sats 92. Patient tolerated well.

## 2011-08-30 NOTE — Anesthesia Postprocedure Evaluation (Signed)
  Anesthesia Post-op Note  Patient: Kathryn Morgan  Procedure(s) Performed: Procedure(s) (LRB): AORTIC VALVE REPLACEMENT (AVR) (N/A)  Patient Location: PACU and Nursing Unit  Anesthesia Type: General  Level of Consciousness: awake, alert  and oriented  Airway and Oxygen Therapy: Patient Spontanous Breathing  Post-op Pain: none  Post-op Assessment: Post-op Vital signs reviewed, Patient's Cardiovascular Status Stable and Respiratory Function Stable  Post-op Vital Signs: Reviewed and stable  Complications: No apparent anesthesia complications and , PONV in the evening on the DOS

## 2011-08-30 NOTE — Discharge Summary (Signed)
I agree with the above discharge summary and plan for follow-up.  Kathryn Morgan H  

## 2011-08-30 NOTE — Progress Notes (Signed)
   CARE MANAGEMENT NOTE 08/30/2011  Patient:  Kathryn Morgan, Kathryn Morgan   Account Number:  [id number]  Date Initiated:  08/30/2011  Documentation initiated by:  Braniyah Besse  Subjective/Objective Assessment:   PT S/P AVR ON 08/27/11.  PTA, PT INDEPENDENT, LIVES WITH SPOUSE.     Action/Plan:   MET WITH PT TO DISCUSS DC PLANS.  PT STATES HUSBAND AND DAUGHTER TO PROVIDE 24HR CARE AT DISCHARGE.   Anticipated DC Date:  09/02/2011   Anticipated DC Plan:  HOME W HOME HEALTH SERVICES      DC Planning Services  CM consult      [facility] Choice  HOME HEALTH   Choice offered to / List presented to:  C-1 Patient   DME arranged  Levan Hurst      DME agency  [facility]     [facility] arranged  HH-1 RN  HH-2 PT      [facility] agency  [facility]   Status of service:  In process, will continue to follow Medicare Important Message given?   (If response is "NO", the following Medicare IM given date fields will be blank) Date Medicare IM given:   Date Additional Medicare IM given:    Discharge Disposition:  HOME W HOME HEALTH SERVICES  Per UR Regulation:    If discussed at Long Length of Stay Meetings, dates discussed:    Comments:  08/30/11 Derrich Gaby,RN,BSN 1110 MET WITH PT AND DAUGHTER TO DISCUSS DC PLANS.  PT REQUESTS RW FOR HOME; STATES SHE HAS HAD HOME HEALTH AFTER LAST HOSPITAL ADMISSION, AND WISHES TO USE [facility] FOR HOME HEALTH NEEDS AGAIN.  WILL ARRANGE HHRN FOR RESTORATIVE CARE AND HHPT, AS PT WOULD BENEFIT FROM CONT THERAPY AT HOME.  WILL FOLLOW FOR ADDITONAL NEEDS.  PT MAY NEED HOME O2 AT DISCHARGE; WILL CONT TO FOLLOW FOR POSSIBLE HOME O2 AT DISCHARGE.

## 2011-08-31 LAB — GLUCOSE, CAPILLARY: Glucose-Capillary: 175 mg/dL — ABNORMAL HIGH (ref 70–99)

## 2011-08-31 MED ORDER — INSULIN ASPART 100 UNIT/ML ~~LOC~~ SOLN
10.0000 [IU] | Freq: Three times a day (TID) | SUBCUTANEOUS | Status: DC
  Administered 2011-08-31: 10 [IU] via SUBCUTANEOUS

## 2011-08-31 MED ORDER — DIPHENHYDRAMINE HCL 25 MG PO CAPS: 25.0000 mg | ORAL_CAPSULE | Freq: Four times a day (QID) | ORAL | Status: AC | PRN

## 2011-08-31 MED ORDER — DIPHENHYDRAMINE HCL 25 MG PO CAPS: 25.0000 mg | ORAL_CAPSULE | Freq: Four times a day (QID) | ORAL | Status: DC | PRN

## 2011-08-31 NOTE — Progress Notes (Signed)
   CARE MANAGEMENT NOTE 08/31/2011  Patient:  GUDRUN, AXE   Account Number:  [id number]  Date Initiated:  08/30/2011  Documentation initiated by:  Chaddrick Brue  Subjective/Objective Assessment:   PT S/P AVR ON 08/27/11.  PTA, PT INDEPENDENT, LIVES WITH SPOUSE.     Action/Plan:   MET WITH PT TO DISCUSS DC PLANS.  PT STATES HUSBAND AND DAUGHTER TO PROVIDE 24HR CARE AT DISCHARGE.   Anticipated DC Date:  09/02/2011   Anticipated DC Plan:  HOME W HOME HEALTH SERVICES      DC Planning Services  CM consult      [facility] Choice  HOME HEALTH   Choice offered to / List presented to:  C-1 Patient   DME arranged  WALKER - ROLLING  OXYGEN  BEDSIDE COMMODE      DME agency  [facility]     HH arranged  HH-1 RN  HH-2 PT      [facility] agency  [facility]   Status of service:  Completed, signed off Medicare Important Message given?   (If response is "NO", the following Medicare IM given date fields will be blank) Date Medicare IM given:   Date Additional Medicare IM given:    Discharge Disposition:  HOME W HOME HEALTH SERVICES  Per UR Regulation:  Reviewed for med. necessity/level of care/duration of stay  If discussed at Long Length of Stay Meetings, dates discussed:    Comments:  08/31/11 Mikaylah Libbey,RN,BSN 1300 PT WILL NEED HOME O2 AND ALSO REQUESTS [facility] FOR HOME. PLANNING DC HOME TODAY.  NOTIFIED [facility] OF DME NEEDS AND DC HOME TODAY.  PORTABLE TANK AND DME DELIVERED TO PT'S ROOM PRIOR TO DC HOME. Phone #[phone]   08/30/11 Thanh Mottern,RN,BSN 1110 MET WITH PT AND DAUGHTER TO DISCUSS DC PLANS.  PT REQUESTS RW FOR HOME; STATES SHE HAS HAD HOME HEALTH AFTER LAST HOSPITAL ADMISSION, AND WISHES TO USE [facility] FOR HOME HEALTH NEEDS AGAIN.  WILL ARRANGE HHRN FOR RESTORATIVE CARE AND HHPT, AS PT WOULD BENEFIT FROM CONT THERAPY AT HOME.  WILL FOLLOW FOR ADDITONAL NEEDS. Phone #[phone]

## 2011-08-31 NOTE — Progress Notes (Signed)
UR Completed.  Dorse Locy Jane [phone] 08/31/2011  

## 2011-08-31 NOTE — Progress Notes (Addendum)
Subjective:  Kathryn Morgan has no complaints this morning.  She states she would like to go home  Objective:  Vital Signs in the last 24 hours: Temp:  [97.8 F (36.6 C)-98.7 F (37.1 C)] 98.2 F (36.8 C) (03/26 0555) Pulse Rate:  [85-98] 89  (03/26 0555) Resp:  [18] 18  (03/26 0555) BP: (93-113)/(54-64) 112/64 mmHg (03/26 0555) SpO2:  [97 %-100 %] 100 % (03/26 0555) Weight:  [201 lb 15.1 oz (91.6 kg)] 201 lb 15.1 oz (91.6 kg) (03/26 0511)  Intake/Output from previous day: 03/25 0701 - 03/26 0700 In: 960 [P.O.:960] Out: 1450 [Urine:1450] Intake/Output from this shift:    Physical Exam: General appearance: alert and no distress Lungs: clear to auscultation bilaterally Heart: regular rate and rhythm Abdomen: soft, non-tender; bowel sounds normal; no masses,  no organomegaly Extremities: edema 1+ Skin: incision healing well  Lab Results:  Basename 08/30/11 0605 08/29/11 0355  WBC 12.1* 13.1*  HGB 8.9* 8.8*  PLT 112* 102*    Basename 08/30/11 0605 08/29/11 0355  NA 138 141  K 4.3 3.8  CL 100 104  CO2 33* 32  GLUCOSE 210* 90  BUN 21 15  CREATININE 0.78 0.69   No results found for this basename: TROPONINI:2,CK,MB:2 in the last 72 hours Hepatic Function Panel No results found for this basename: PROT,ALBUMIN,AST,ALT,ALKPHOS,BILITOT,BILIDIR,IBILI in the last 72 hours No results found for this basename: CHOL in the last 72 hours No results found for this basename: PROTIME in the last 72 hours  Assessment/Plan:   1. S/P AVR doing very well 2. CV- patient in NSR with good blood pressure control on Coreg 3. Resp- patient on oxygen this morning which was discontinued, saturations on room air were 95%, will recheck when patient ambulates.  Also encouraged continued use of IS 4. Pain control- patient started on Oxycontin IR yesterday which relieved her pain, she states she did experience some minor itching which was relieved with Benadryl 5. DM-CBGs moderately controlled-  will adjust insulin according to diabetes management recommendations 6. Dispo- patient states would really like to go home today. Patient doing very well.  If patient does not require oxygen on ambulation will d/c home this afternoon.   LOS: 4 days    BARRETT, ERIN 08/31/2011, 8:05 AM    I have seen and examined the patient and agree with the assessment and plan as outlined.  D/C home today.  Bradrick Kamau H 08/31/2011 9:03 AM

## 2011-08-31 NOTE — Progress Notes (Signed)
EPW and CTS x 2 discontinued per protocol. Tips intact. Patient tolerated well. Last INR INR/Prothrombin Time on   .  Patient advised Bedrest X 1 hour. Ave Filter

## 2011-08-31 NOTE — Progress Notes (Addendum)
SATURATION QUALIFICATIONS:  Patient Saturations on Room Air at Rest = 86-93%  Patient Saturations on [employer] while Ambulating = 83%  Patient Saturations on 2 Liters of oxygen while Ambulating = 92-97%  CARDIAC REHAB PHASE I   PRE:  Rate/Rhythm: 91SR  BP:  Supine:   Sitting: 105/46  Standing:    SaO2: 97%1L room, 86-93%RA room, 83%RA at beginning of walk  MODE:  Ambulation: 350 ft   POST:  Rate/Rhythem: 110  BP:  Supine:   Sitting: 88/38    103/41  Standing:    SaO2: 92-97%2L 0924-1030 Documented for home oxygen as above. Pt walked 350 ft on 2L with rolling walker and asst x 1. Stopped several times to rest. C/o lightheadedness at times. BP low after walk and better with rest.  Tires easily. To recliner after walk. Ed completed. Permission given to refer to  Phase 2.  Duanne Limerick

## 2011-08-31 NOTE — Discharge Summary (Signed)
I agree with the above discharge summary and plan for follow-up.  Fernanda Twaddell H  

## 2011-09-01 ENCOUNTER — Other Ambulatory Visit: Payer: Self-pay | Admitting: Thoracic Surgery (Cardiothoracic Vascular Surgery)

## 2011-09-01 ENCOUNTER — Telehealth: Payer: Self-pay | Admitting: Cardiology

## 2011-09-01 LAB — TYPE AND SCREEN
ABO/RH(D): B POS
Antibody Screen: NEGATIVE
Donor AG Type: NEGATIVE
Unit division: 0
Unit division: 0
Unit division: 0

## 2011-09-01 NOTE — Telephone Encounter (Signed)
I reviewed the records. Actually she underwent aortic valve replacement on 3/22, recently discharged by Dr. Cornelius Moras. Discharge summary indicates Lasix to be taken at 40 mg daily and Aldactone at 12.5 mg daily. Please check and see that she has been following her weights. She could double up her Lasix dose over the next few days, however needs to be seen within a week for clinical evaluation.

## 2011-09-01 NOTE — Telephone Encounter (Signed)
PT HAD VALVE REPLACEMENT YESTERDAY AND SHE FEELS LIKE SHE HAS TO MUCH FLUID TODAY WOULD LIKE TO TAKE A EXTRA PILL, PLUS SHE WANTS TO START BACK ON HER FISH OIL PILL.

## 2011-09-01 NOTE — Telephone Encounter (Signed)
**Note De-Identified Kathryn Morgan Obfuscation** Dr. Cornelius Moras performed valve replacement yesterday. Please advise./LV

## 2011-09-01 NOTE — Telephone Encounter (Signed)
**Note De-Identified Kathryn Morgan Obfuscation** Pt. states that she has been taking Lasix 40 mg and Aldactone 12.5 mg daily. She states that she has been weighing daily but has not recorded any weights, she is advised to write weights down daily and to try to weigh at around the same time in similar amount of clothing daily. Pt. advised that she may double her Lasix dose over the next few days if needed.  She has been given an appt. with Jacolyn Reedy, PAC on April 10 @ 1:00 pm on a day that Dr. Diona Browner is here as Dr. Diona Browner has no openings. Pt. states she understands instructions given./LV

## 2011-09-06 MED FILL — Mannitol IV Soln 20%: INTRAVENOUS | Qty: 500 | Status: AC

## 2011-09-06 MED FILL — Heparin Sodium (Porcine) Inj 1000 Unit/ML: INTRAMUSCULAR | Qty: 10 | Status: AC

## 2011-09-06 MED FILL — Sodium Bicarbonate IV Soln 8.4%: INTRAVENOUS | Qty: 50 | Status: AC

## 2011-09-06 MED FILL — Heparin Sodium (Porcine) Inj 1000 Unit/ML: INTRAMUSCULAR | Qty: 30 | Status: AC

## 2011-09-06 MED FILL — Lidocaine HCl IV Inj 20 MG/ML: INTRAVENOUS | Qty: 5 | Status: AC

## 2011-09-06 MED FILL — Electrolyte-R (PH 7.4) Solution: INTRAVENOUS | Qty: 6000 | Status: AC

## 2011-09-06 MED FILL — Sodium Chloride IV Soln 0.9%: INTRAVENOUS | Qty: 1000 | Status: AC

## 2011-09-06 MED FILL — Sodium Chloride Irrigation Soln 0.9%: Qty: 3000 | Status: AC

## 2011-09-09 ENCOUNTER — Telehealth: Payer: Self-pay

## 2011-09-09 DIAGNOSIS — G8918 Other acute postprocedural pain: Secondary | ICD-10-CM

## 2011-09-09 MED ORDER — TRAMADOL HCL 50 MG PO TABS: 50.0000 mg | ORAL_TABLET | Freq: Four times a day (QID) | ORAL | Status: AC | PRN

## 2011-09-09 NOTE — Telephone Encounter (Signed)
RX called to pharm

## 2011-09-13 ENCOUNTER — Encounter: Payer: Self-pay | Admitting: Thoracic Surgery (Cardiothoracic Vascular Surgery)

## 2011-09-15 ENCOUNTER — Ambulatory Visit (INDEPENDENT_AMBULATORY_CARE_PROVIDER_SITE_OTHER): Payer: Medicare Other | Admitting: Physician Assistant

## 2011-09-15 ENCOUNTER — Encounter: Payer: Self-pay | Admitting: Physician Assistant

## 2011-09-15 ENCOUNTER — Other Ambulatory Visit: Payer: Self-pay | Admitting: Thoracic Surgery (Cardiothoracic Vascular Surgery)

## 2011-09-15 VITALS — BP 122/72 | HR 91 | Resp 18 | Ht 63.0 in | Wt 189.0 lb

## 2011-09-15 DIAGNOSIS — I5022 Chronic systolic (congestive) heart failure: Secondary | ICD-10-CM

## 2011-09-15 DIAGNOSIS — Z952 Presence of prosthetic heart valve: Secondary | ICD-10-CM

## 2011-09-15 DIAGNOSIS — I10 Essential (primary) hypertension: Secondary | ICD-10-CM

## 2011-09-15 DIAGNOSIS — I359 Nonrheumatic aortic valve disorder, unspecified: Secondary | ICD-10-CM

## 2011-09-15 DIAGNOSIS — Z954 Presence of other heart-valve replacement: Secondary | ICD-10-CM

## 2011-09-15 NOTE — Assessment & Plan Note (Signed)
No evidence of heart failure on exam today. 

## 2011-09-15 NOTE — Assessment & Plan Note (Signed)
Blood pressure control. 

## 2011-09-15 NOTE — Progress Notes (Signed)
HPI:  This is a 71 year old female patient who recently underwent aortic valve replacement with a  Edwards Magna Ease Pericardial Tissue Valve (size 23mm, model #3300TFX, serial [id number]) by Dr. Cornelius Moras 08/27/11 for severe aortic stenosis. She has had a nonischemic cardiomyopathy ejection fraction about 40-45%. Cardiac catheter showed normal coronary arteries. Follow-up TEE Intra-Op showed no change in ejection fraction.  The patient was sent home with home oxygen. She's been very slow to get around. She has been wearing her oxygen all day until yesterday when she was told she could take it off periodically by the respiratory therapist. She has some pain in her shoulders and chest from the incision. She denies palpitations, orthopnea, or excessive edema. She did have some edema after her diuretics were decreased but she took extra Lasix and a fluid resolved. She is scheduled to see Dr. Cornelius Moras next week.  Allergies  Allergen Reactions  . Codeine Itching    Pt states itching with cough syrup in the past  . Phenergan Other (See Comments)    Pt and husband state severe hallucinations and paranoia from phenegran  . Ace Inhibitors Cough  . Levaquin Itching    Current Outpatient Prescriptions on File Prior to Visit  Medication Sig Dispense Refill  . alendronate (FOSAMAX) 35 MG tablet Take 70 mg by mouth every 7 (seven) days.       . carvedilol (COREG) 12.5 MG tablet Take 1 tablet (12.5 mg total) by mouth 2 (two) times daily with a meal.  60 tablet  6  . cetirizine (ZYRTEC) 10 MG tablet Take 10 mg by mouth daily.      Marland Kitchen docusate sodium (COLACE) 100 MG capsule Take 100 mg by mouth 2 (two) times daily.        . Fluticasone-Salmeterol (ADVAIR DISKUS) 250-50 MCG/DOSE AEPB Inhale 1 puff into the lungs every 12 (twelve) hours.        . furosemide (LASIX) 20 MG tablet Take 40 mg by mouth daily.      Marland Kitchen guaiFENesin (MUCINEX) 600 MG 12 hr tablet Take 1,200 mg by mouth 2 (two) times daily.      . insulin detemir  (LEVEMIR) 100 UNIT/ML injection Inject 44 Units into the skin as directed.       . Insulin Lispro, Human, (HUMALOG KWIKPEN [place]) Inject 10-16 Units into the skin 3 (three) times daily with meals. Per sliding scale      . Multiple Vitamin (MULITIVITAMIN WITH MINERALS) TABS Take 1 tablet by mouth daily.      . pantoprazole (PROTONIX) 40 MG tablet Take 1 tablet (40 mg total) by mouth 2 (two) times daily before a meal.  60 tablet  3  . Probiotic Product (PROBIOTIC FORMULA PO) Take 1 tablet by mouth daily.      . simvastatin (ZOCOR) 20 MG tablet Take 20 mg by mouth at bedtime.        . sodium chloride (OCEAN) 0.65 % nasal spray Place 1 spray into the nose daily as needed. For dryness      . spironolactone (ALDACTONE) 25 MG tablet Take 12.5 mg by mouth daily.      Marland Kitchen tiotropium (SPIRIVA) 18 MCG inhalation capsule Place 18 mcg into inhaler and inhale daily.        . traMADol (ULTRAM) 50 MG tablet Take 1-2 tablets (50-100 mg total) by mouth every 6 (six) hours as needed for pain.  40 tablet  0  . Vitamin D, Ergocalciferol, (DRISDOL) 50000 UNITS CAPS Take 50,000 Units by  mouth every 7 (seven) days. For 12 weeks        Past Medical History  Diagnosis Date  . Type 2 diabetes mellitus     Insulin pump  . Essential hypertension, benign   . Dyslipidemia   . Depression   . Necrotizing pancreatitis   . Neuropathy   . Rhabdomyolysis   . Gallstone pancreatitis     2005  . Chronic systolic heart failure     LVEF 35%  . Aortic stenosis     Moderate to severe  . Respiratory failure     Home O2 ordered 05/2011  . Right bundle-branch block   . Rheumatic fever   . Obesity (BMI 30-39.9) 08/10/2011  . Osteoporosis 08/10/2011  . GERD (gastroesophageal reflux disease)   . Allergy   . CHF (congestive heart failure)   . Heart murmur     AORTIC STENOSIS  . COPD (chronic obstructive pulmonary disease)   . Shortness of breath     WITH EXERTION, DRY COUGH   . Diabetes mellitus   . Blood dyscrasia     VIT D DEF    . Hepatitis     2005 OR 06  ? HEP C    . Blood transfusion   . Chronic kidney disease     KIDNEYS WORKING 50 PERCENT   . Arthritis     PRE OP RT LEG  . S/P aortic valve replacement 08/27/2011    23mm [facility] Ease pericardial tissue valve    Past Surgical History  Procedure Date  . Esophagogastroduodenoscopy 04/21/04    Normal esophagus/normal D1,D2; attempted  transgastric drainage/stent placement of pancreatic pseudocyst, no obvious location for gastrotomy, therefore transferred to [facility]  . Debridement pancreas   . Pancreatic pseudocyst drainage   . Cholecystectomy   . Cardiac catheterization     DR MCDOWELL   . Aortic valve replacement 08/27/2011    Procedure: AORTIC VALVE REPLACEMENT (AVR);  Surgeon: Purcell Nails, MD;  Location: [facility] OR;  Service: Open Heart Surgery;  Laterality: N/A;    Family History  Problem Relation Age of Onset  . Colon cancer Neg Hx   . Anesthesia problems Neg Hx   . Heart disease      CHF    History   Social History  . Marital Status: Married    Spouse Name: N/A    Number of Children: N/A  . Years of Education: N/A   Occupational History  . Not on file.   Social History Main Topics  . Smoking status: Former Smoker -- 3.0 packs/day for 15 years    Types: Cigarettes    Quit date: 06/09/1981  . Smokeless tobacco: Not on file   Comment: quit about 20+ yrs ago  . Alcohol Use: No  . Drug Use: No  . Sexually Active: Not on file   Other Topics Concern  . Not on file   Social History Narrative  . No narrative on file    ROS:see history of present illness otherwise negative   PHYSICAL EXAM: Well-nournished, in no acute distress. Neck: No JVD, HJR, Bruit, or thyroid enlargement Lungs: Decreased breath sounds throughout with scattered crackles left greater than right Cardiovascular:Incision healing well. There is a small area where a scab came off but the tissue looks healthy and no signs of infection, RRR, PMI not displaced,  heart sounds normal, no murmurs, gallops, bruit, thrill, or heave. Abdomen: BS normal. Soft without organomegaly, masses, lesions or tenderness. Extremities: without cyanosis, clubbing or  edema. Good distal pulses bilateral SKin: Warm, no lesions or rashes  Musculoskeletal: No deformities Neuro: no focal signs  BP 122/72  Pulse 91  Resp 18  Ht 5\' 3"  (1.6 m)  Wt 189 lb (85.73 kg)  BMI 33.48 kg/m2   WUJ:WJXBJY sinus rhythm with right bundle branch block nonspecific ST-T wave changes  TEE intraop 08/27/11: Study Conclusions  - Left ventricle: Systolic function was mildly to moderately   reduced. The estimated ejection fraction was in the range   of 40% to 45%. - Aortic valve: Normal-sized, mildly calcified annulus.   Functionally bicuspid; moderately calcified leaflets;   fusion of the left-noncoronary commissure. Cusp separation   was severely reduced, with the Left and Non-coronary cusps   hardly mobile at all. Valve mobility was restricted.   Transvalvular velocity was increased, due to stenosis.   There was severe stenosis. AVA 1.03 cm2 by VTI, 0.99 cm2   by Vmax, and 0.651 cm2 by plainimetry. Mild regurgitation   originating from the central coaptation point and directed   centrally in the LVOT. The LVOT measures 2.2 cm in   diameter. - Right ventricle: The cavity size was mildly dilated. - Staged echo: Post CBP: the patient separated easily from   CPB. LVEF is essentially unchanged from preop, with EF   35-40%. Prosthetic valve well seated in the Aortic   annulus, with an intact suture line. No AI seen in LV   outflow tract. Post replacement images demonstrate no   residual valvular insufficiency or perivalvular leak. Intraoperative transesophageal echocardiography

## 2011-09-15 NOTE — Assessment & Plan Note (Signed)
Patient is doing very well status post pericardial tissue valve aortic valve replacement  in March.The patient is still on home O2. I encouraged her to resume incentive spirometry and begin to ambulate more.

## 2011-09-15 NOTE — Patient Instructions (Addendum)
Your physician recommends that you schedule a follow-up appointment in: 4-6 weeks  Continue incentive spirometry at home  Low salt diet

## 2011-09-20 ENCOUNTER — Ambulatory Visit
Admission: RE | Admit: 2011-09-20 | Discharge: 2011-09-20 | Disposition: A | Discharge status: Home / Self Care | Payer: Medicare Other | Source: Ambulatory Visit | Attending: Thoracic Surgery (Cardiothoracic Vascular Surgery) | Admitting: Thoracic Surgery (Cardiothoracic Vascular Surgery)

## 2011-09-20 ENCOUNTER — Ambulatory Visit (INDEPENDENT_AMBULATORY_CARE_PROVIDER_SITE_OTHER): Payer: Self-pay | Admitting: Physician Assistant

## 2011-09-20 VITALS — BP 123/68 | HR 96 | Resp 16 | Ht 63.5 in | Wt 190.0 lb

## 2011-09-20 DIAGNOSIS — Z954 Presence of other heart-valve replacement: Secondary | ICD-10-CM

## 2011-09-20 DIAGNOSIS — I359 Nonrheumatic aortic valve disorder, unspecified: Secondary | ICD-10-CM

## 2011-09-20 DIAGNOSIS — Z952 Presence of prosthetic heart valve: Secondary | ICD-10-CM

## 2011-09-20 DIAGNOSIS — I350 Nonrheumatic aortic (valve) stenosis: Secondary | ICD-10-CM

## 2011-09-20 NOTE — Progress Notes (Signed)
HPI: Patient returns for routine postoperative follow-up having undergone Aortic Valve Replacement utilizing a 23mm Edwards Pericardial Magna Ease Valve conduit on 08/27/2011  The patient's early postoperative recovery while in the hospital was notable for hypoxemia with ambulation requiring home oxygen at discharge. Since hospital discharge the patient reports she is doing better.  She states she still feels tired especially with exertion.  The patient denies chest pain and shortness of breath.  She questions what activities she is allowed to be performing at this time, because she is anxious to be allowed to do housework and resume cooking.  Current Outpatient Prescriptions  Medication Sig Dispense Refill  . alendronate (FOSAMAX) 35 MG tablet Take 70 mg by mouth every 7 (seven) days.       Marland Kitchen aspirin EC 325 MG tablet Take 325 mg by mouth daily.      . carvedilol (COREG) 12.5 MG tablet Take 1 tablet (12.5 mg total) by mouth 2 (two) times daily with a meal.  60 tablet  6  . cetirizine (ZYRTEC) 10 MG tablet Take 10 mg by mouth daily.      Marland Kitchen docusate sodium (COLACE) 100 MG capsule Take 100 mg by mouth 2 (two) times daily.        . Fluticasone-Salmeterol (ADVAIR DISKUS) 250-50 MCG/DOSE AEPB Inhale 1 puff into the lungs every 12 (twelve) hours.        . furosemide (LASIX) 20 MG tablet Take 40 mg by mouth daily.      Marland Kitchen guaiFENesin (MUCINEX) 600 MG 12 hr tablet Take 1,200 mg by mouth 2 (two) times daily.      . insulin detemir (LEVEMIR) 100 UNIT/ML injection Inject 44 Units into the skin as directed.       . Insulin Lispro, Human, (HUMALOG KWIKPEN [place]) Inject 10-16 Units into the skin 3 (three) times daily with meals. Per sliding scale      . Multiple Vitamin (MULITIVITAMIN WITH MINERALS) TABS Take 1 tablet by mouth daily.      . pantoprazole (PROTONIX) 40 MG tablet Take 1 tablet (40 mg total) by mouth 2 (two) times daily before a meal.  60 tablet  3  . Probiotic Product (PROBIOTIC FORMULA PO) Take 1  tablet by mouth daily.      . simvastatin (ZOCOR) 20 MG tablet Take 20 mg by mouth at bedtime.        . sodium chloride (OCEAN) 0.65 % nasal spray Place 1 spray into the nose daily as needed. For dryness      . spironolactone (ALDACTONE) 25 MG tablet Take 12.5 mg by mouth daily.      Marland Kitchen tiotropium (SPIRIVA) 18 MCG inhalation capsule Place 18 mcg into inhaler and inhale daily.        . traMADol (ULTRAM) 50 MG tablet Take 50 mg by mouth every 6 (six) hours as needed.      . Vitamin D, Ergocalciferol, (DRISDOL) 50000 UNITS CAPS Take 50,000 Units by mouth every 7 (seven) days. For 12 weeks      . oxycodone (OXY-IR) 5 MG capsule Take 5 mg by mouth every 4 (four) hours as needed.      . traMADol (ULTRAM) 50 MG tablet Take 1-2 tablets (50-100 mg total) by mouth every 6 (six) hours as needed for pain.  40 tablet  0    Physical Exam:  BP 123/68  Pulse 96  Resp 16  Ht 5' 3.5" (1.613 m)  Wt 190 lb (86.183 kg)  BMI 33.13 kg/m2  SpO2 94%  Gen: no apparent distress Lungs: CTA bilaterally Heart: RRR Abd: soft non-tender, non-distended Skin: incisions healing well, there is one chest tube site that hasn't quite healed entirely, but no evidence of acute infectious process Neuro: grossly intact  Diagnostic Tests:  CXR:  Cardiomegaly, stable in appearance  Impression:  [name] is S/P AVR doing very well.  She continues to fatigue easily which is not unexpected for the surgery the patient underwent.  Overall, she states she definitely feels better than she did before surgery.  I encouraged the patient to continue ambulating as much as she can tolerate and rest when she needs to.  In regards to her resuming her normal activities, I stressed to the patient that she needs to wait until her sternal precautions have resolved to resume cleaning her house.  However, I did tell the patient that she can help her husband cook meals as long as she observes her sternal precautions.  She understands and will  obey her sternal precautions as instructed.  Plan:  1. S/P AVR doing well 2. Fatigue- encouraged patient to continue ambulating as tolerating, and reassured patient that she will notice improvement as times passes 3. Hypoxemia- resolved patient is no longer using home oxygen 4. RTC in one month with a CXR to see Dr. Cornelius Moras

## 2011-09-27 ENCOUNTER — Telehealth: Payer: Self-pay | Admitting: Cardiology

## 2011-09-27 NOTE — Telephone Encounter (Signed)
Wants to know if you can fax order to [facility] to d/c oxygen.  Patient states that she does not need it anymore. Fax number is [phone] / tg

## 2011-09-27 NOTE — Telephone Encounter (Signed)
Pt. Is advised to contact Dr. Orvan July office to notify them that she is no longer using oxygen and to ask them to d/c order, she verbalized understanding./LV

## 2011-09-28 ENCOUNTER — Encounter (HOSPITAL_COMMUNITY)
Admission: RE | Admit: 2011-09-28 | Discharge: 2011-09-28 | Disposition: A | Discharge status: Home / Self Care | Payer: Medicare Other | Source: Ambulatory Visit | Attending: Cardiology | Admitting: Cardiology

## 2011-09-28 ENCOUNTER — Encounter (HOSPITAL_COMMUNITY): Payer: Self-pay

## 2011-09-28 DIAGNOSIS — Z7982 Long term (current) use of aspirin: Secondary | ICD-10-CM | POA: Insufficient documentation

## 2011-09-28 DIAGNOSIS — E119 Type 2 diabetes mellitus without complications: Secondary | ICD-10-CM | POA: Insufficient documentation

## 2011-09-28 DIAGNOSIS — B1920 Unspecified viral hepatitis C without hepatic coma: Secondary | ICD-10-CM | POA: Insufficient documentation

## 2011-09-28 DIAGNOSIS — J449 Chronic obstructive pulmonary disease, unspecified: Secondary | ICD-10-CM | POA: Insufficient documentation

## 2011-09-28 DIAGNOSIS — N189 Chronic kidney disease, unspecified: Secondary | ICD-10-CM | POA: Insufficient documentation

## 2011-09-28 DIAGNOSIS — I509 Heart failure, unspecified: Secondary | ICD-10-CM | POA: Insufficient documentation

## 2011-09-28 DIAGNOSIS — E785 Hyperlipidemia, unspecified: Secondary | ICD-10-CM | POA: Insufficient documentation

## 2011-09-28 DIAGNOSIS — Z5189 Encounter for other specified aftercare: Secondary | ICD-10-CM | POA: Insufficient documentation

## 2011-09-28 DIAGNOSIS — J4489 Other specified chronic obstructive pulmonary disease: Secondary | ICD-10-CM | POA: Insufficient documentation

## 2011-09-28 DIAGNOSIS — Z794 Long term (current) use of insulin: Secondary | ICD-10-CM | POA: Insufficient documentation

## 2011-09-28 DIAGNOSIS — Z954 Presence of other heart-valve replacement: Secondary | ICD-10-CM | POA: Insufficient documentation

## 2011-09-28 DIAGNOSIS — M810 Age-related osteoporosis without current pathological fracture: Secondary | ICD-10-CM | POA: Insufficient documentation

## 2011-09-28 DIAGNOSIS — K219 Gastro-esophageal reflux disease without esophagitis: Secondary | ICD-10-CM | POA: Insufficient documentation

## 2011-09-28 DIAGNOSIS — I129 Hypertensive chronic kidney disease with stage 1 through stage 4 chronic kidney disease, or unspecified chronic kidney disease: Secondary | ICD-10-CM | POA: Insufficient documentation

## 2011-09-28 DIAGNOSIS — E669 Obesity, unspecified: Secondary | ICD-10-CM | POA: Insufficient documentation

## 2011-09-28 DIAGNOSIS — I5022 Chronic systolic (congestive) heart failure: Secondary | ICD-10-CM | POA: Insufficient documentation

## 2011-09-28 NOTE — Progress Notes (Signed)
Patient is referred to Cardiac Rehab by Dr. Diona Browner due to Aortic Valve Replacement. During orientation advised patient on arrival and appointment times what to wear, what to do before, during and after exercise. Reviewed attendance and class policy. Talked about inclement weather and class consultation policy. Pt is scheduled to start Cardiac Rehab on 10/04/11 at 11:00 am. Pt was advised to come to class 5 minutes before class starts. He was also given instructions on meeting with the dietician and attending the Family Structure classes. Pt is eager to get started.

## 2011-09-28 NOTE — Patient Instructions (Signed)
Pt has finished orientation and is scheduled to start CR on 10/04/11 at 11:00 am. Pt has been instructed to arrive to class 15 minutes early for scheduled class. Pt has been instructed to wear comfortable clothing and shoes with rubber soles. Pt has been told to take their medications 1 hour prior to coming to class.  If the patient is not going to attend class, he/she has been instructed to call.

## 2011-10-04 ENCOUNTER — Encounter (HOSPITAL_COMMUNITY)
Admission: RE | Admit: 2011-10-04 | Discharge: 2011-10-04 | Disposition: A | Discharge status: Home / Self Care | Payer: Medicare Other | Source: Ambulatory Visit | Attending: Cardiology | Admitting: Cardiology

## 2011-10-06 ENCOUNTER — Encounter (HOSPITAL_COMMUNITY)
Admission: RE | Admit: 2011-10-06 | Discharge: 2011-10-06 | Disposition: A | Discharge status: Home / Self Care | Payer: Medicare Other | Source: Ambulatory Visit | Attending: Cardiology | Admitting: Cardiology

## 2011-10-06 DIAGNOSIS — N189 Chronic kidney disease, unspecified: Secondary | ICD-10-CM | POA: Insufficient documentation

## 2011-10-06 DIAGNOSIS — B1920 Unspecified viral hepatitis C without hepatic coma: Secondary | ICD-10-CM | POA: Insufficient documentation

## 2011-10-06 DIAGNOSIS — Z7982 Long term (current) use of aspirin: Secondary | ICD-10-CM | POA: Insufficient documentation

## 2011-10-06 DIAGNOSIS — Z954 Presence of other heart-valve replacement: Secondary | ICD-10-CM | POA: Insufficient documentation

## 2011-10-06 DIAGNOSIS — I509 Heart failure, unspecified: Secondary | ICD-10-CM | POA: Insufficient documentation

## 2011-10-06 DIAGNOSIS — J449 Chronic obstructive pulmonary disease, unspecified: Secondary | ICD-10-CM | POA: Insufficient documentation

## 2011-10-06 DIAGNOSIS — I129 Hypertensive chronic kidney disease with stage 1 through stage 4 chronic kidney disease, or unspecified chronic kidney disease: Secondary | ICD-10-CM | POA: Insufficient documentation

## 2011-10-06 DIAGNOSIS — E669 Obesity, unspecified: Secondary | ICD-10-CM | POA: Insufficient documentation

## 2011-10-06 DIAGNOSIS — K219 Gastro-esophageal reflux disease without esophagitis: Secondary | ICD-10-CM | POA: Insufficient documentation

## 2011-10-06 DIAGNOSIS — I5022 Chronic systolic (congestive) heart failure: Secondary | ICD-10-CM | POA: Insufficient documentation

## 2011-10-06 DIAGNOSIS — M810 Age-related osteoporosis without current pathological fracture: Secondary | ICD-10-CM | POA: Insufficient documentation

## 2011-10-06 DIAGNOSIS — E119 Type 2 diabetes mellitus without complications: Secondary | ICD-10-CM | POA: Insufficient documentation

## 2011-10-06 DIAGNOSIS — J4489 Other specified chronic obstructive pulmonary disease: Secondary | ICD-10-CM | POA: Insufficient documentation

## 2011-10-06 DIAGNOSIS — Z5189 Encounter for other specified aftercare: Secondary | ICD-10-CM | POA: Insufficient documentation

## 2011-10-06 DIAGNOSIS — Z794 Long term (current) use of insulin: Secondary | ICD-10-CM | POA: Insufficient documentation

## 2011-10-06 DIAGNOSIS — E785 Hyperlipidemia, unspecified: Secondary | ICD-10-CM | POA: Insufficient documentation

## 2011-10-08 ENCOUNTER — Encounter (HOSPITAL_COMMUNITY)
Admission: RE | Admit: 2011-10-08 | Discharge: 2011-10-08 | Disposition: A | Discharge status: Home / Self Care | Payer: Medicare Other | Source: Ambulatory Visit | Attending: Cardiology | Admitting: Cardiology

## 2011-10-08 ENCOUNTER — Other Ambulatory Visit: Payer: Self-pay | Admitting: Gastroenterology

## 2011-10-11 ENCOUNTER — Encounter (HOSPITAL_COMMUNITY)
Admission: RE | Admit: 2011-10-11 | Discharge: 2011-10-11 | Disposition: A | Discharge status: Home / Self Care | Payer: Medicare Other | Source: Ambulatory Visit | Attending: Cardiology | Admitting: Cardiology

## 2011-10-13 ENCOUNTER — Encounter (HOSPITAL_COMMUNITY)
Admission: RE | Admit: 2011-10-13 | Discharge: 2011-10-13 | Disposition: A | Discharge status: Home / Self Care | Payer: Medicare Other | Source: Ambulatory Visit | Attending: Cardiology | Admitting: Cardiology

## 2011-10-14 ENCOUNTER — Other Ambulatory Visit: Payer: Self-pay | Admitting: Thoracic Surgery (Cardiothoracic Vascular Surgery)

## 2011-10-14 DIAGNOSIS — I359 Nonrheumatic aortic valve disorder, unspecified: Secondary | ICD-10-CM

## 2011-10-15 ENCOUNTER — Encounter (HOSPITAL_COMMUNITY)
Admission: RE | Admit: 2011-10-15 | Discharge: 2011-10-15 | Disposition: A | Discharge status: Home / Self Care | Payer: Medicare Other | Source: Ambulatory Visit | Attending: Cardiology | Admitting: Cardiology

## 2011-10-18 ENCOUNTER — Ambulatory Visit
Admission: RE | Admit: 2011-10-18 | Discharge: 2011-10-18 | Disposition: A | Discharge status: Home / Self Care | Payer: Medicare Other | Source: Ambulatory Visit | Attending: Thoracic Surgery (Cardiothoracic Vascular Surgery) | Admitting: Thoracic Surgery (Cardiothoracic Vascular Surgery)

## 2011-10-18 ENCOUNTER — Ambulatory Visit (INDEPENDENT_AMBULATORY_CARE_PROVIDER_SITE_OTHER): Payer: Self-pay | Admitting: Thoracic Surgery (Cardiothoracic Vascular Surgery)

## 2011-10-18 ENCOUNTER — Encounter (HOSPITAL_COMMUNITY): Payer: Medicare Other

## 2011-10-18 ENCOUNTER — Encounter: Payer: Self-pay | Admitting: Thoracic Surgery (Cardiothoracic Vascular Surgery)

## 2011-10-18 VITALS — BP 121/77 | HR 96 | Resp 20 | Ht 63.5 in | Wt 194.0 lb

## 2011-10-18 DIAGNOSIS — Z952 Presence of prosthetic heart valve: Secondary | ICD-10-CM

## 2011-10-18 DIAGNOSIS — Z954 Presence of other heart-valve replacement: Secondary | ICD-10-CM

## 2011-10-18 DIAGNOSIS — I359 Nonrheumatic aortic valve disorder, unspecified: Secondary | ICD-10-CM

## 2011-10-18 NOTE — Progress Notes (Signed)
[address]            [place] [address]          [phone]     CARDIOTHORACIC SURGERY OFFICE NOTE  Referring Provider is Nona Dell, MD PCP is [name], MD, MD   HPI:  Patient returns for further followup status post aortic valve replacement on 08/27/2011.  Postoperatively she has done quite well. She returns to the office today for routine followup. She has mild residual soreness in her chest. Her exercise tolerance is improving and she is actively participating in the cardiac rehabilitation program. She has no shortness of breath. She has no other complaints other than the fact that she is having some mild pain in her lower back.   Current Outpatient Prescriptions  Medication Sig Dispense Refill  . alendronate (FOSAMAX) 35 MG tablet Take 70 mg by mouth every 7 (seven) days.       Marland Kitchen aspirin EC 325 MG tablet Take 325 mg by mouth daily.      . carvedilol (COREG) 12.5 MG tablet Take 1 tablet (12.5 mg total) by mouth 2 (two) times daily with a meal.  60 tablet  6  . cetirizine (ZYRTEC) 10 MG tablet Take 10 mg by mouth daily.      Marland Kitchen docusate sodium (COLACE) 100 MG capsule Take 100 mg by mouth 2 (two) times daily.        . fish oil-omega-3 fatty acids 1000 MG capsule Take 2 g by mouth daily.      . Fluticasone-Salmeterol (ADVAIR DISKUS) 250-50 MCG/DOSE AEPB Inhale 1 puff into the lungs every 12 (twelve) hours.        . furosemide (LASIX) 20 MG tablet Take 40 mg by mouth daily.      Marland Kitchen guaiFENesin (MUCINEX) 600 MG 12 hr tablet Take 1,200 mg by mouth 2 (two) times daily.      . insulin detemir (LEVEMIR) 100 UNIT/ML injection Inject 44 Units into the skin as directed.       . Insulin Lispro, Human, (HUMALOG KWIKPEN [place]) Inject 10-16 Units into the skin 3 (three) times daily with meals. Per sliding scale      . Multiple Vitamin (MULITIVITAMIN WITH MINERALS) TABS Take 1 tablet by mouth daily.      Marland Kitchen oxycodone (OXY-IR) 5 MG capsule Take 5 mg by  mouth every 4 (four) hours as needed.      . Probiotic Product (PROBIOTIC FORMULA PO) Take 1 tablet by mouth daily.      Marland Kitchen PROTONIX 40 MG tablet TAKE 1 TABLET BY MOUTH TWICE DAILY BEFORE A MEAL.  60 each  5  . simvastatin (ZOCOR) 20 MG tablet Take 20 mg by mouth at bedtime.        . sodium chloride (OCEAN) 0.65 % nasal spray Place 1 spray into the nose daily as needed. For dryness      . spironolactone (ALDACTONE) 25 MG tablet Take 12.5 mg by mouth daily.      Marland Kitchen tiotropium (SPIRIVA) 18 MCG inhalation capsule Place 18 mcg into inhaler and inhale daily.        . traMADol (ULTRAM) 50 MG tablet Take 50 mg by mouth every 6 (six) hours as needed.      . Vitamin D, Ergocalciferol, (DRISDOL) 50000 UNITS CAPS Take 50,000 Units by mouth every 7 (seven) days. For 12 weeks  Physical Exam:   BP 121/77  Pulse 96  Resp 20  Ht 5' 3.5" (1.613 m)  Wt 194 lb (87.998 kg)  BMI 33.83 kg/m2  SpO2 95%  General:  Well-appearing  Chest:   Clear to auscultation with symmetrical breath sounds  CV:   Regular rate and rhythm without murmur  Incisions:  Sternotomy scar is healing nicely and the sternum is stable on palpation  Abdomen:  Soft nontender  Extremities:  Warm and well-perfused  Diagnostic Tests:   *RADIOLOGY REPORT*  Clinical Data: Postop. heart surgery.  CHEST - 2 VIEW  Comparison: 09/20/2011  Findings: Prior valve replacement. Mild cardiomegaly. Linear  atelectasis or scarring in the left base. Right lung is clear. No  effusions. No acute bony abnormality.  IMPRESSION:  Cardiomegaly. Left basilar scarring or atelectasis.  Original Report Authenticated By: Cyndie Chime, M.D.   Impression:  Excellent progress following aortic valve replacement on 08/27/2011  Plan:  I've encouraged patient to continue to gradually increase her physical activity as tolerated without any particular limitations of the fact that she should continue to refrain from any heavy lifting or strenuous use  of her on her shoulders for another 6 or 8 weeks. I've encouraged her to continue to participate in the cardiac rehabilitation program. All of her questions been addressed. In the future the patient will call and return to see [place] as needed.   Salvatore Decent. Cornelius Moras, MD 10/18/2011 1:39 PM

## 2011-10-18 NOTE — Patient Instructions (Signed)
The patient has been reminded to continue to avoid any heavy lifting or strenuous use of arms or shoulders for at least a total of three months from the time of surgery.  

## 2011-10-19 LAB — PULMONARY FUNCTION TEST

## 2011-10-20 ENCOUNTER — Ambulatory Visit (INDEPENDENT_AMBULATORY_CARE_PROVIDER_SITE_OTHER): Payer: Medicare Other | Admitting: Adult Health

## 2011-10-20 ENCOUNTER — Ambulatory Visit: Payer: Medicare Other | Admitting: Cardiology

## 2011-10-20 ENCOUNTER — Encounter (HOSPITAL_COMMUNITY)
Admission: RE | Admit: 2011-10-20 | Discharge: 2011-10-20 | Disposition: A | Discharge status: Home / Self Care | Payer: Medicare Other | Source: Ambulatory Visit | Attending: Cardiology | Admitting: Cardiology

## 2011-10-20 ENCOUNTER — Encounter: Payer: Self-pay | Admitting: Adult Health

## 2011-10-20 VITALS — BP 120/67 | HR 89 | Ht 63.0 in | Wt 189.0 lb

## 2011-10-20 DIAGNOSIS — I350 Nonrheumatic aortic (valve) stenosis: Secondary | ICD-10-CM

## 2011-10-20 DIAGNOSIS — I359 Nonrheumatic aortic valve disorder, unspecified: Secondary | ICD-10-CM

## 2011-10-20 DIAGNOSIS — I10 Essential (primary) hypertension: Secondary | ICD-10-CM

## 2011-10-20 MED ORDER — CARVEDILOL 12.5 MG PO TABS: 12.5000 mg | ORAL_TABLET | Freq: Two times a day (BID) | ORAL | Status: DC

## 2011-10-20 MED ORDER — SPIRONOLACTONE 25 MG PO TABS: 12.5000 mg | ORAL_TABLET | Freq: Every day | ORAL | Status: DC

## 2011-10-20 MED ORDER — FUROSEMIDE 40 MG PO TABS: 40.0000 mg | ORAL_TABLET | Freq: Every day | ORAL | Status: DC

## 2011-10-20 NOTE — Patient Instructions (Signed)
Your physician recommends that you schedule a follow-up appointment in: 3 months with Dr McDowell  

## 2011-10-20 NOTE — Assessment & Plan Note (Signed)
Blood pressure is well controlled. She is intolerant to ACE inhibitors due to cough. She will continue carvedilol. I will check BMET on lasix and spironolactone. Will recheck echo in 6 months to evaluate LV fx.

## 2011-10-20 NOTE — Progress Notes (Signed)
HPI:  This is a 71 year old female patient who recently underwent aortic valve replacement with a  [employer] Magna Ease Pericardial Tissue Valve (size 23mm, model #3300TFX, serial [id number]) by Dr. Cornelius Moras 08/27/11 for severe aortic stenosis. She has had a nonischemic cardiomyopathy ejection fraction about 40-45%. Cardiac catheter showed normal coronary arteries. Follow-up TEE Intra-Op showed no change in ejection fraction.  The patient was sent home with home oxygen. She's been very slow to get around. She has been wearing her oxygen all day until yesterday when she was told she could take it off periodically by the respiratory therapist. She has some pain in her shoulders and chest from the incision. She denies palpitations, orthopnea, or excessive edema. She did have some edema after her diuretics were decreased but she took extra Lasix and a fluid resolved.  She has been seen by Dr. Cornelius Moras this week and released from a CVTS standpoint. She is going to cardiac rehab and is enjoying it. She has questions concerning the need for abx prophylaxis with the prosthetic tissue valve.  She is without complaint and please with the outcome of the surgery.  Allergies  Allergen Reactions  . Codeine Itching    Pt states itching with cough syrup in the past  . Promethazine Hcl Other (See Comments)    Pt and husband state severe hallucinations and paranoia from phenegran  . Ace Inhibitors Cough  . Levofloxacin Itching    Current Outpatient Prescriptions on File Prior to Visit  Medication Sig Dispense Refill  . alendronate (FOSAMAX) 35 MG tablet Take 70 mg by mouth every 7 (seven) days.       Marland Kitchen aspirin EC 325 MG tablet Take 325 mg by mouth daily.      . cetirizine (ZYRTEC) 10 MG tablet Take 10 mg by mouth daily.      Marland Kitchen docusate sodium (COLACE) 100 MG capsule Take 100 mg by mouth 2 (two) times daily.        . fish oil-omega-3 fatty acids 1000 MG capsule Take 2 g by mouth daily.      . Fluticasone-Salmeterol (ADVAIR  DISKUS) 250-50 MCG/DOSE AEPB Inhale 1 puff into the lungs every 12 (twelve) hours.        Marland Kitchen guaiFENesin (MUCINEX) 600 MG 12 hr tablet Take 1,200 mg by mouth 2 (two) times daily.      . insulin detemir (LEVEMIR) 100 UNIT/ML injection Inject 44 Units into the skin as directed.       . Insulin Lispro, Human, (HUMALOG KWIKPEN [place]) Inject 10-16 Units into the skin 3 (three) times daily with meals. Per sliding scale      . Multiple Vitamin (MULITIVITAMIN WITH MINERALS) TABS Take 1 tablet by mouth daily.      . Probiotic Product (PROBIOTIC FORMULA PO) Take 1 tablet by mouth daily.      Marland Kitchen PROTONIX 40 MG tablet TAKE 1 TABLET BY MOUTH TWICE DAILY BEFORE A MEAL.  60 each  5  . simvastatin (ZOCOR) 20 MG tablet Take 20 mg by mouth at bedtime.        . sodium chloride (OCEAN) 0.65 % nasal spray Place 1 spray into the nose daily as needed. For dryness      . tiotropium (SPIRIVA) 18 MCG inhalation capsule Place 18 mcg into inhaler and inhale daily.        . Vitamin D, Ergocalciferol, (DRISDOL) 50000 UNITS CAPS Take 50,000 Units by mouth every 7 (seven) days. For 12 weeks      .  DISCONTD: carvedilol (COREG) 12.5 MG tablet Take 1 tablet (12.5 mg total) by mouth 2 (two) times daily with a meal.  60 tablet  6  . DISCONTD: spironolactone (ALDACTONE) 25 MG tablet Take 12.5 mg by mouth daily.        Past Medical History  Diagnosis Date  . Type 2 diabetes mellitus     Insulin pump  . Essential hypertension, benign   . Dyslipidemia   . Depression   . Necrotizing pancreatitis   . Neuropathy   . Rhabdomyolysis   . Gallstone pancreatitis     2005  . Chronic systolic heart failure     LVEF 35%  . Aortic stenosis     Moderate to severe  . Respiratory failure     Home O2 ordered 05/2011  . Right bundle-branch block   . Rheumatic fever   . Obesity (BMI 30-39.9) 08/10/2011  . Osteoporosis 08/10/2011  . GERD (gastroesophageal reflux disease)   . Allergy   . CHF (congestive heart failure)   . Heart murmur      AORTIC STENOSIS  . COPD (chronic obstructive pulmonary disease)   . Shortness of breath     WITH EXERTION, DRY COUGH   . Diabetes mellitus   . Blood dyscrasia     VIT D DEF  . Hepatitis     2005 OR 06  ? HEP C    . Blood transfusion   . Chronic kidney disease     KIDNEYS WORKING 50 PERCENT   . Arthritis     PRE OP RT LEG  . S/P aortic valve replacement 08/27/2011    23mm [facility] Ease pericardial tissue valve    Past Surgical History  Procedure Date  . Esophagogastroduodenoscopy 04/21/04    Normal esophagus/normal D1,D2; attempted  transgastric drainage/stent placement of pancreatic pseudocyst, no obvious location for gastrotomy, therefore transferred to [facility]  . Debridement pancreas   . Pancreatic pseudocyst drainage   . Cholecystectomy   . Cardiac catheterization     DR MCDOWELL   . Aortic valve replacement 08/27/2011    Procedure: AORTIC VALVE REPLACEMENT (AVR);  Surgeon: Purcell Nails, MD;  Location: [facility] OR;  Service: Open Heart Surgery;  Laterality: N/A;    Family History  Problem Relation Age of Onset  . Colon cancer Neg Hx   . Anesthesia problems Neg Hx   . Heart disease      CHF  . Heart attack Father     History   Social History  . Marital Status: Married    Spouse Name: N/A    Number of Children: N/A  . Years of Education: N/A   Occupational History  . Not on file.   Social History Main Topics  . Smoking status: Former Smoker -- 3.0 packs/day for 15 years    Types: Cigarettes    Quit date: 06/09/1981  . Smokeless tobacco: Not on file   Comment: quit about 20+ yrs ago  . Alcohol Use: No  . Drug Use: No  . Sexually Active: Not on file   Other Topics Concern  . Not on file   Social History Narrative  . No narrative on file    ROS:see history of present illness otherwise negative   PHYSICAL EXAM: Well-nournished, in no acute distress. Neck: No JVD, HJR, Bruit, or thyroid enlargement Lungs: Clear to  auscultation Cardiovascular:Incision healing well. No evidence of infection or evisceration. , RRR, PMI not displaced, heart sounds normal, no murmurs, gallops, bruit, thrill, or  heave. Abdomen: BS normal. Soft without organomegaly, masses, lesions or tenderness. Extremities: without cyanosis, clubbing or edema. Good distal pulses bilateral SKin: Warm, no lesions or rashes  Musculoskeletal: No deformities Neuro: no focal signs  BP 120/67  Pulse 89  Ht 5\' 3"  (1.6 m)  Wt 189 lb (85.73 kg)  BMI 33.48 kg/m2   NWG:NFAOZH sinus rhythm with right bundle branch block nonspecific ST-T wave changes  TEE intraop 08/27/11: Study Conclusions  - Left ventricle: Systolic function was mildly to moderately   reduced. The estimated ejection fraction was in the range   of 40% to 45%. - Aortic valve: Normal-sized, mildly calcified annulus.   Functionally bicuspid; moderately calcified leaflets;   fusion of the left-noncoronary commissure. Cusp separation   was severely reduced, with the Left and Non-coronary cusps   hardly mobile at all. Valve mobility was restricted.   Transvalvular velocity was increased, due to stenosis.   There was severe stenosis. AVA 1.03 cm2 by VTI, 0.99 cm2   by Vmax, and 0.651 cm2 by plainimetry. Mild regurgitation   originating from the central coaptation point and directed   centrally in the LVOT. The LVOT measures 2.2 cm in   diameter. - Right ventricle: The cavity size was mildly dilated. - Staged echo: Post CBP: the patient separated easily from   CPB. LVEF is essentially unchanged from preop, with EF   35-40%. Prosthetic valve well seated in the Aortic   annulus, with an intact suture line. No AI seen in LV   outflow tract. Post replacement images demonstrate no   residual valvular insufficiency or perivalvular leak. Intraoperative transesophageal echocardiography  Assessment and Plan

## 2011-10-20 NOTE — Assessment & Plan Note (Signed)
She is 6 weeks s/p tissue valve replacement. Doing very well and without complaint. ACC does not recommend antibiotic prophylaxis prior to dental surgery and therefore none is recommended. She will continue all medications as directed. Continue cardiac rehab as this is clearly beneficial to her.

## 2011-10-21 ENCOUNTER — Other Ambulatory Visit: Payer: Self-pay | Admitting: *Deleted

## 2011-10-21 LAB — BASIC METABOLIC PANEL
BUN: 28 mg/dL — ABNORMAL HIGH (ref 6–23)
CO2: 30 mEq/L (ref 19–32)
Chloride: 102 mEq/L (ref 96–112)
Creat: 1.24 mg/dL — ABNORMAL HIGH (ref 0.50–1.10)
Glucose, Bld: 213 mg/dL — ABNORMAL HIGH (ref 70–99)

## 2011-10-22 ENCOUNTER — Encounter (HOSPITAL_COMMUNITY)
Admission: RE | Admit: 2011-10-22 | Discharge: 2011-10-22 | Disposition: A | Discharge status: Home / Self Care | Payer: Medicare Other | Source: Ambulatory Visit | Attending: Cardiology | Admitting: Cardiology

## 2011-10-25 ENCOUNTER — Encounter (HOSPITAL_COMMUNITY)
Admission: RE | Admit: 2011-10-25 | Discharge: 2011-10-25 | Disposition: A | Discharge status: Home / Self Care | Payer: Medicare Other | Source: Ambulatory Visit | Attending: Cardiology | Admitting: Cardiology

## 2011-10-27 ENCOUNTER — Encounter (HOSPITAL_COMMUNITY)
Admission: RE | Admit: 2011-10-27 | Discharge: 2011-10-27 | Disposition: A | Discharge status: Home / Self Care | Payer: Medicare Other | Source: Ambulatory Visit | Attending: Cardiology | Admitting: Cardiology

## 2011-10-29 ENCOUNTER — Encounter (HOSPITAL_COMMUNITY): Payer: Medicare Other

## 2011-11-01 ENCOUNTER — Encounter (HOSPITAL_COMMUNITY): Payer: Medicare Other

## 2011-11-03 ENCOUNTER — Encounter (HOSPITAL_COMMUNITY): Payer: Medicare Other

## 2011-11-05 ENCOUNTER — Encounter (HOSPITAL_COMMUNITY)
Admission: RE | Admit: 2011-11-05 | Discharge: 2011-11-05 | Disposition: A | Discharge status: Home / Self Care | Payer: Medicare Other | Source: Ambulatory Visit | Attending: Cardiology | Admitting: Cardiology

## 2011-11-08 ENCOUNTER — Encounter (HOSPITAL_COMMUNITY)
Admission: RE | Admit: 2011-11-08 | Discharge: 2011-11-08 | Disposition: A | Discharge status: Home / Self Care | Payer: Medicare Other | Source: Ambulatory Visit | Attending: Cardiology | Admitting: Cardiology

## 2011-11-08 DIAGNOSIS — Z954 Presence of other heart-valve replacement: Secondary | ICD-10-CM | POA: Insufficient documentation

## 2011-11-08 DIAGNOSIS — Z5189 Encounter for other specified aftercare: Secondary | ICD-10-CM | POA: Insufficient documentation

## 2011-11-08 DIAGNOSIS — M810 Age-related osteoporosis without current pathological fracture: Secondary | ICD-10-CM | POA: Insufficient documentation

## 2011-11-08 DIAGNOSIS — I129 Hypertensive chronic kidney disease with stage 1 through stage 4 chronic kidney disease, or unspecified chronic kidney disease: Secondary | ICD-10-CM | POA: Insufficient documentation

## 2011-11-08 DIAGNOSIS — Z794 Long term (current) use of insulin: Secondary | ICD-10-CM | POA: Insufficient documentation

## 2011-11-08 DIAGNOSIS — J449 Chronic obstructive pulmonary disease, unspecified: Secondary | ICD-10-CM | POA: Insufficient documentation

## 2011-11-08 DIAGNOSIS — I5022 Chronic systolic (congestive) heart failure: Secondary | ICD-10-CM | POA: Insufficient documentation

## 2011-11-08 DIAGNOSIS — B1920 Unspecified viral hepatitis C without hepatic coma: Secondary | ICD-10-CM | POA: Insufficient documentation

## 2011-11-08 DIAGNOSIS — K219 Gastro-esophageal reflux disease without esophagitis: Secondary | ICD-10-CM | POA: Insufficient documentation

## 2011-11-08 DIAGNOSIS — I509 Heart failure, unspecified: Secondary | ICD-10-CM | POA: Insufficient documentation

## 2011-11-08 DIAGNOSIS — E785 Hyperlipidemia, unspecified: Secondary | ICD-10-CM | POA: Insufficient documentation

## 2011-11-08 DIAGNOSIS — E119 Type 2 diabetes mellitus without complications: Secondary | ICD-10-CM | POA: Insufficient documentation

## 2011-11-08 DIAGNOSIS — Z7982 Long term (current) use of aspirin: Secondary | ICD-10-CM | POA: Insufficient documentation

## 2011-11-08 DIAGNOSIS — J4489 Other specified chronic obstructive pulmonary disease: Secondary | ICD-10-CM | POA: Insufficient documentation

## 2011-11-08 DIAGNOSIS — E669 Obesity, unspecified: Secondary | ICD-10-CM | POA: Insufficient documentation

## 2011-11-08 DIAGNOSIS — N189 Chronic kidney disease, unspecified: Secondary | ICD-10-CM | POA: Insufficient documentation

## 2011-11-10 ENCOUNTER — Encounter (HOSPITAL_COMMUNITY)
Admission: RE | Admit: 2011-11-10 | Discharge: 2011-11-10 | Disposition: A | Discharge status: Home / Self Care | Payer: Medicare Other | Source: Ambulatory Visit | Attending: Cardiology | Admitting: Cardiology

## 2011-11-12 ENCOUNTER — Encounter (HOSPITAL_COMMUNITY)
Admission: RE | Admit: 2011-11-12 | Discharge: 2011-11-12 | Disposition: A | Discharge status: Home / Self Care | Payer: Medicare Other | Source: Ambulatory Visit | Attending: Cardiology | Admitting: Cardiology

## 2011-11-15 ENCOUNTER — Encounter (HOSPITAL_COMMUNITY)
Admission: RE | Admit: 2011-11-15 | Discharge: 2011-11-15 | Disposition: A | Discharge status: Home / Self Care | Payer: Medicare Other | Source: Ambulatory Visit | Attending: Cardiology | Admitting: Cardiology

## 2011-11-17 ENCOUNTER — Encounter (HOSPITAL_COMMUNITY)
Admission: RE | Admit: 2011-11-17 | Discharge: 2011-11-17 | Disposition: A | Discharge status: Home / Self Care | Payer: Medicare Other | Source: Ambulatory Visit | Attending: Cardiology | Admitting: Cardiology

## 2011-11-19 ENCOUNTER — Encounter (HOSPITAL_COMMUNITY): Payer: Medicare Other

## 2011-11-22 ENCOUNTER — Encounter (HOSPITAL_COMMUNITY): Payer: Medicare Other

## 2011-11-24 ENCOUNTER — Encounter (HOSPITAL_COMMUNITY)
Admission: RE | Admit: 2011-11-24 | Discharge: 2011-11-24 | Disposition: A | Discharge status: Home / Self Care | Payer: Medicare Other | Source: Ambulatory Visit | Attending: Cardiology | Admitting: Cardiology

## 2011-11-26 ENCOUNTER — Encounter (HOSPITAL_COMMUNITY)
Admission: RE | Admit: 2011-11-26 | Discharge: 2011-11-26 | Disposition: A | Discharge status: Home / Self Care | Payer: Medicare Other | Source: Ambulatory Visit | Attending: Cardiology | Admitting: Cardiology

## 2011-11-29 ENCOUNTER — Encounter (HOSPITAL_COMMUNITY)
Admission: RE | Admit: 2011-11-29 | Discharge: 2011-11-29 | Disposition: A | Discharge status: Home / Self Care | Payer: Medicare Other | Source: Ambulatory Visit | Attending: Cardiology | Admitting: Cardiology

## 2011-12-01 ENCOUNTER — Encounter (HOSPITAL_COMMUNITY)
Admission: RE | Admit: 2011-12-01 | Discharge: 2011-12-01 | Disposition: A | Discharge status: Home / Self Care | Payer: Medicare Other | Source: Ambulatory Visit | Attending: Cardiology | Admitting: Cardiology

## 2011-12-03 ENCOUNTER — Encounter (HOSPITAL_COMMUNITY)
Admission: RE | Admit: 2011-12-03 | Discharge: 2011-12-03 | Disposition: A | Discharge status: Home / Self Care | Payer: Medicare Other | Source: Ambulatory Visit | Attending: Cardiology | Admitting: Cardiology

## 2011-12-06 ENCOUNTER — Encounter (HOSPITAL_COMMUNITY)
Admission: RE | Admit: 2011-12-06 | Discharge: 2011-12-06 | Disposition: A | Discharge status: Home / Self Care | Payer: Medicare Other | Source: Ambulatory Visit | Attending: Cardiology | Admitting: Cardiology

## 2011-12-06 DIAGNOSIS — I5022 Chronic systolic (congestive) heart failure: Secondary | ICD-10-CM | POA: Insufficient documentation

## 2011-12-06 DIAGNOSIS — Z794 Long term (current) use of insulin: Secondary | ICD-10-CM | POA: Insufficient documentation

## 2011-12-06 DIAGNOSIS — J4489 Other specified chronic obstructive pulmonary disease: Secondary | ICD-10-CM | POA: Insufficient documentation

## 2011-12-06 DIAGNOSIS — I129 Hypertensive chronic kidney disease with stage 1 through stage 4 chronic kidney disease, or unspecified chronic kidney disease: Secondary | ICD-10-CM | POA: Insufficient documentation

## 2011-12-06 DIAGNOSIS — K219 Gastro-esophageal reflux disease without esophagitis: Secondary | ICD-10-CM | POA: Insufficient documentation

## 2011-12-06 DIAGNOSIS — E785 Hyperlipidemia, unspecified: Secondary | ICD-10-CM | POA: Insufficient documentation

## 2011-12-06 DIAGNOSIS — M810 Age-related osteoporosis without current pathological fracture: Secondary | ICD-10-CM | POA: Insufficient documentation

## 2011-12-06 DIAGNOSIS — B1920 Unspecified viral hepatitis C without hepatic coma: Secondary | ICD-10-CM | POA: Insufficient documentation

## 2011-12-06 DIAGNOSIS — Z954 Presence of other heart-valve replacement: Secondary | ICD-10-CM | POA: Insufficient documentation

## 2011-12-06 DIAGNOSIS — E669 Obesity, unspecified: Secondary | ICD-10-CM | POA: Insufficient documentation

## 2011-12-06 DIAGNOSIS — Z7982 Long term (current) use of aspirin: Secondary | ICD-10-CM | POA: Insufficient documentation

## 2011-12-06 DIAGNOSIS — J449 Chronic obstructive pulmonary disease, unspecified: Secondary | ICD-10-CM | POA: Insufficient documentation

## 2011-12-06 DIAGNOSIS — N189 Chronic kidney disease, unspecified: Secondary | ICD-10-CM | POA: Insufficient documentation

## 2011-12-06 DIAGNOSIS — I509 Heart failure, unspecified: Secondary | ICD-10-CM | POA: Insufficient documentation

## 2011-12-06 DIAGNOSIS — E119 Type 2 diabetes mellitus without complications: Secondary | ICD-10-CM | POA: Insufficient documentation

## 2011-12-06 DIAGNOSIS — Z5189 Encounter for other specified aftercare: Secondary | ICD-10-CM | POA: Insufficient documentation

## 2011-12-08 ENCOUNTER — Encounter (HOSPITAL_COMMUNITY)
Admission: RE | Admit: 2011-12-08 | Discharge: 2011-12-08 | Disposition: A | Discharge status: Home / Self Care | Payer: Medicare Other | Source: Ambulatory Visit | Attending: Cardiology | Admitting: Cardiology

## 2011-12-10 ENCOUNTER — Encounter (HOSPITAL_COMMUNITY)
Admission: RE | Admit: 2011-12-10 | Discharge: 2011-12-10 | Disposition: A | Discharge status: Home / Self Care | Payer: Medicare Other | Source: Ambulatory Visit | Attending: Cardiology | Admitting: Cardiology

## 2011-12-10 NOTE — Progress Notes (Signed)
Cardiac Rehabilitation Program Outcomes Report   Orientation:  09/28/2011 Graduate Date:  tbd Discharge Date:  tbd # of sessions completed: 3 DX: Aortic Valve replacement  Cardiologist: Nona Dell Family MD:  Cherre Huger Class Time:  11:00  A.  Exercise Program:  Tolerates exercise @ 2.0 METS for 15 minutes  B.  Mental Health:  Good mental attitude  C.  Education/Instruction/Skills  Knows THR for exercise and Uses Perceived Exertion Scale and/or Dyspnea Scale  Uses Perceived Exertion Scale and/or Dyspnea Scale  D.  Nutrition/Weight Control/Body Composition:  Adherence to prescribed nutrition program: good    E.  Blood Lipids    Lab Results  Component Value Date   CHOL  Value: 145        ATP III CLASSIFICATION:  <200     mg/dL   Desirable  409-811  mg/dL   Borderline High  >=914    mg/dL   High        7/82/9562   HDL 51 10/04/2009   LDLCALC  Value: 66        Total Cholesterol/HDL:CHD Risk Coronary Heart Disease Risk Table                     Men   Women  1/2 Average Risk   3.4   3.3  Average Risk       5.0   4.4  2 X Average Risk   9.6   7.1  3 X Average Risk  23.4   11.0        Use the calculated Patient Ratio above and the CHD Risk Table to determine the patient's CHD Risk.        ATP III CLASSIFICATION (LDL):  <100     mg/dL   Optimal  130-865  mg/dL   Near or Above                    Optimal  130-159  mg/dL   Borderline  784-696  mg/dL   High  >295     mg/dL   Very High 2/84/1324   TRIG 138 10/04/2009   CHOLHDL 2.8 10/04/2009    F.  Lifestyle Changes:  Making positive lifestyle changes  G.  Symptoms noted with exercise:  Asymptomatic  Report Completed By:  Kathryn Sine l. Jasmarie Coppock RN   Comments:  This is patients 1 st week report. She achieved a peak METS of 2.0. Her resting HR is 90 and her resting BP is 120/70. Her peak HR is 104 and her peak BP is 130/60. A report will follow on her 18th visit Halfway point in program.

## 2011-12-13 ENCOUNTER — Encounter (HOSPITAL_COMMUNITY)
Admission: RE | Admit: 2011-12-13 | Discharge: 2011-12-13 | Disposition: A | Discharge status: Home / Self Care | Payer: Medicare Other | Source: Ambulatory Visit | Attending: Cardiology | Admitting: Cardiology

## 2011-12-15 ENCOUNTER — Encounter (HOSPITAL_COMMUNITY)
Admission: RE | Admit: 2011-12-15 | Discharge: 2011-12-15 | Disposition: A | Discharge status: Home / Self Care | Payer: Medicare Other | Source: Ambulatory Visit | Attending: Cardiology | Admitting: Cardiology

## 2011-12-17 ENCOUNTER — Encounter (HOSPITAL_COMMUNITY)
Admission: RE | Admit: 2011-12-17 | Discharge: 2011-12-17 | Disposition: A | Discharge status: Home / Self Care | Payer: Medicare Other | Source: Ambulatory Visit | Attending: Cardiology | Admitting: Cardiology

## 2011-12-20 ENCOUNTER — Encounter (HOSPITAL_COMMUNITY)
Admission: RE | Admit: 2011-12-20 | Discharge: 2011-12-20 | Disposition: A | Discharge status: Home / Self Care | Payer: Medicare Other | Source: Ambulatory Visit | Attending: Cardiology | Admitting: Cardiology

## 2011-12-22 ENCOUNTER — Encounter (HOSPITAL_COMMUNITY): Payer: Medicare Other

## 2011-12-24 ENCOUNTER — Encounter (HOSPITAL_COMMUNITY): Payer: Medicare Other

## 2011-12-27 ENCOUNTER — Encounter (HOSPITAL_COMMUNITY): Payer: Medicare Other

## 2011-12-29 ENCOUNTER — Encounter (HOSPITAL_COMMUNITY)
Admission: RE | Admit: 2011-12-29 | Discharge: 2011-12-29 | Disposition: A | Discharge status: Home / Self Care | Payer: Medicare Other | Source: Ambulatory Visit | Attending: Cardiology | Admitting: Cardiology

## 2011-12-31 ENCOUNTER — Encounter (HOSPITAL_COMMUNITY)
Admission: RE | Admit: 2011-12-31 | Discharge: 2011-12-31 | Disposition: A | Discharge status: Home / Self Care | Payer: Medicare Other | Source: Ambulatory Visit | Attending: Cardiology | Admitting: Cardiology

## 2012-01-03 ENCOUNTER — Encounter (HOSPITAL_COMMUNITY)
Admission: RE | Admit: 2012-01-03 | Discharge: 2012-01-03 | Disposition: A | Discharge status: Home / Self Care | Payer: Medicare Other | Source: Ambulatory Visit | Attending: Cardiology | Admitting: Cardiology

## 2012-01-05 ENCOUNTER — Encounter (HOSPITAL_COMMUNITY)
Admission: RE | Admit: 2012-01-05 | Discharge: 2012-01-05 | Disposition: A | Discharge status: Home / Self Care | Payer: Medicare Other | Source: Ambulatory Visit | Attending: Cardiology | Admitting: Cardiology

## 2012-01-06 ENCOUNTER — Other Ambulatory Visit: Payer: Self-pay | Admitting: *Deleted

## 2012-01-06 MED ORDER — CARVEDILOL 12.5 MG PO TABS: 12.5000 mg | ORAL_TABLET | Freq: Two times a day (BID) | ORAL | Status: DC

## 2012-01-06 NOTE — Progress Notes (Signed)
Cardiac Rehabilitation Program Outcomes Report   Orientation:  09/28/2011 Graduate Date:  tbd Discharge Date:  tbd # of sessions completed: 18 DX: Aortic Valve Replacement  Cardiologist: McDowell,Samuel Family MD:  Karleen Hampshire Class Time:  11:00  A.  Exercise Program:  Tolerates exercise @ 4.0 METS for 15 minutes  B.  Mental Health:  Good mental attitude  C.  Education/Instruction/Skills  Knows THR for exercise and Uses Perceived Exertion Scale and/or Dyspnea Scale  Uses Perceived Exertion Scale and/or Dyspnea Scale  D.  Nutrition/Weight Control/Body Composition:  Adherence to prescribed nutrition program: good    E.  Blood Lipids    Lab Results  Component Value Date   CHOL  Value: 145        ATP III CLASSIFICATION:  <200     mg/dL   Desirable  161-096  mg/dL   Borderline High  >=045    mg/dL   High        09/13/8117   HDL 51 10/04/2009   LDLCALC  Value: 66        Total Cholesterol/HDL:CHD Risk Coronary Heart Disease Risk Table                     Men   Women  1/2 Average Risk   3.4   3.3  Average Risk       5.0   4.4  2 X Average Risk   9.6   7.1  3 X Average Risk  23.4   11.0        Use the calculated Patient Ratio above and the CHD Risk Table to determine the patient's CHD Risk.        ATP III CLASSIFICATION (LDL):  <100     mg/dL   Optimal  147-829  mg/dL   Near or Above                    Optimal  130-159  mg/dL   Borderline  562-130  mg/dL   High  >865     mg/dL   Very High 7/84/6962   TRIG 138 10/04/2009   CHOLHDL 2.8 10/04/2009    F.  Lifestyle Changes:  Making positive lifestyle changes  G.  Symptoms noted with exercise:  Asymptomatic  Report Completed By:  Kathryn Huh. Sheriden Archibeque RN   Comments:  Patient has completed her halfway  Session. Her resting HR is 77 and her resting BP was 110/62. Her peak HR was 113 and her  Peak BP was 150/80. A graduation report will follow.

## 2012-01-07 ENCOUNTER — Encounter (HOSPITAL_COMMUNITY): Payer: Medicare Other

## 2012-01-07 ENCOUNTER — Emergency Department (HOSPITAL_COMMUNITY): Payer: Medicare Other

## 2012-01-07 ENCOUNTER — Encounter (HOSPITAL_COMMUNITY): Payer: Self-pay | Admitting: *Deleted

## 2012-01-07 ENCOUNTER — Observation Stay (HOSPITAL_COMMUNITY)
Admission: EM | Admit: 2012-01-07 | Discharge: 2012-01-08 | Disposition: A | Discharge status: Home / Self Care | Payer: Medicare Other | Attending: Internal Medicine | Admitting: Internal Medicine

## 2012-01-07 DIAGNOSIS — J449 Chronic obstructive pulmonary disease, unspecified: Secondary | ICD-10-CM

## 2012-01-07 DIAGNOSIS — R079 Chest pain, unspecified: Secondary | ICD-10-CM | POA: Insufficient documentation

## 2012-01-07 DIAGNOSIS — E119 Type 2 diabetes mellitus without complications: Secondary | ICD-10-CM | POA: Diagnosis present

## 2012-01-07 DIAGNOSIS — R0902 Hypoxemia: Principal | ICD-10-CM | POA: Insufficient documentation

## 2012-01-07 DIAGNOSIS — IMO0001 Reserved for inherently not codable concepts without codable children: Secondary | ICD-10-CM

## 2012-01-07 DIAGNOSIS — I10 Essential (primary) hypertension: Secondary | ICD-10-CM | POA: Diagnosis present

## 2012-01-07 DIAGNOSIS — I509 Heart failure, unspecified: Secondary | ICD-10-CM | POA: Insufficient documentation

## 2012-01-07 DIAGNOSIS — E785 Hyperlipidemia, unspecified: Secondary | ICD-10-CM | POA: Insufficient documentation

## 2012-01-07 DIAGNOSIS — R0789 Other chest pain: Secondary | ICD-10-CM | POA: Diagnosis present

## 2012-01-07 DIAGNOSIS — E669 Obesity, unspecified: Secondary | ICD-10-CM | POA: Insufficient documentation

## 2012-01-07 DIAGNOSIS — I350 Nonrheumatic aortic (valve) stenosis: Secondary | ICD-10-CM

## 2012-01-07 DIAGNOSIS — Z794 Long term (current) use of insulin: Secondary | ICD-10-CM | POA: Insufficient documentation

## 2012-01-07 DIAGNOSIS — E782 Mixed hyperlipidemia: Secondary | ICD-10-CM | POA: Diagnosis present

## 2012-01-07 DIAGNOSIS — I429 Cardiomyopathy, unspecified: Secondary | ICD-10-CM

## 2012-01-07 DIAGNOSIS — E1165 Type 2 diabetes mellitus with hyperglycemia: Secondary | ICD-10-CM

## 2012-01-07 DIAGNOSIS — Z6834 Body mass index (BMI) 34.0-34.9, adult: Secondary | ICD-10-CM | POA: Insufficient documentation

## 2012-01-07 DIAGNOSIS — Z952 Presence of prosthetic heart valve: Secondary | ICD-10-CM

## 2012-01-07 DIAGNOSIS — I5023 Acute on chronic systolic (congestive) heart failure: Secondary | ICD-10-CM | POA: Diagnosis present

## 2012-01-07 DIAGNOSIS — Z954 Presence of other heart-valve replacement: Secondary | ICD-10-CM | POA: Insufficient documentation

## 2012-01-07 DIAGNOSIS — R071 Chest pain on breathing: Secondary | ICD-10-CM

## 2012-01-07 DIAGNOSIS — R0600 Dyspnea, unspecified: Secondary | ICD-10-CM | POA: Diagnosis present

## 2012-01-07 DIAGNOSIS — I129 Hypertensive chronic kidney disease with stage 1 through stage 4 chronic kidney disease, or unspecified chronic kidney disease: Secondary | ICD-10-CM | POA: Insufficient documentation

## 2012-01-07 DIAGNOSIS — R0781 Pleurodynia: Secondary | ICD-10-CM

## 2012-01-07 DIAGNOSIS — R739 Hyperglycemia, unspecified: Secondary | ICD-10-CM

## 2012-01-07 DIAGNOSIS — R110 Nausea: Secondary | ICD-10-CM | POA: Insufficient documentation

## 2012-01-07 DIAGNOSIS — R0602 Shortness of breath: Secondary | ICD-10-CM | POA: Insufficient documentation

## 2012-01-07 DIAGNOSIS — J4489 Other specified chronic obstructive pulmonary disease: Secondary | ICD-10-CM | POA: Insufficient documentation

## 2012-01-07 DIAGNOSIS — N189 Chronic kidney disease, unspecified: Secondary | ICD-10-CM | POA: Insufficient documentation

## 2012-01-07 LAB — CBC
HCT: 36 % (ref 36.0–46.0)
Hemoglobin: 11.8 g/dL — ABNORMAL LOW (ref 12.0–15.0)
MCH: 26.7 pg (ref 26.0–34.0)
MCV: 81.4 fL (ref 78.0–100.0)
Platelets: 168 10*3/uL (ref 150–400)
RBC: 4.42 MIL/uL (ref 3.87–5.11)

## 2012-01-07 LAB — BASIC METABOLIC PANEL
BUN: 26 mg/dL — ABNORMAL HIGH (ref 6–23)
CO2: 31 mEq/L (ref 19–32)
Calcium: 9 mg/dL (ref 8.4–10.5)
Creatinine, Ser: 0.98 mg/dL (ref 0.50–1.10)
Glucose, Bld: 231 mg/dL — ABNORMAL HIGH (ref 70–99)

## 2012-01-07 LAB — POCT I-STAT, CHEM 8
Calcium, Ion: 1.13 mmol/L (ref 1.13–1.30)
Creatinine, Ser: 1.1 mg/dL (ref 0.50–1.10)
Glucose, Bld: 225 mg/dL — ABNORMAL HIGH (ref 70–99)
Hemoglobin: 12.6 g/dL (ref 12.0–15.0)
TCO2: 27 mmol/L (ref 0–100)

## 2012-01-07 LAB — GLUCOSE, CAPILLARY

## 2012-01-07 MED ORDER — DOCUSATE SODIUM 100 MG PO CAPS
100.0000 mg | ORAL_CAPSULE | Freq: Two times a day (BID) | ORAL | Status: DC
Start: 2012-01-07 — End: 2012-01-08
  Administered 2012-01-07 (×2): 100 mg via ORAL
  Filled 2012-01-07 (×2): qty 1

## 2012-01-07 MED ORDER — INSULIN ASPART 100 UNIT/ML ~~LOC~~ SOLN
0.0000 [IU] | Freq: Three times a day (TID) | SUBCUTANEOUS | Status: DC
  Administered 2012-01-07: 15 [IU] via SUBCUTANEOUS
  Administered 2012-01-08: 5 [IU] via SUBCUTANEOUS

## 2012-01-07 MED ORDER — ONDANSETRON HCL 4 MG PO TABS: 4.0000 mg | ORAL_TABLET | Freq: Four times a day (QID) | ORAL | Status: DC | PRN

## 2012-01-07 MED ORDER — SIMVASTATIN 20 MG PO TABS
20.0000 mg | ORAL_TABLET | Freq: Every day | ORAL | Status: DC
  Administered 2012-01-07: 20 mg via ORAL
  Filled 2012-01-07: qty 1

## 2012-01-07 MED ORDER — ACETAMINOPHEN 325 MG PO TABS: 650.0000 mg | ORAL_TABLET | Freq: Four times a day (QID) | ORAL | Status: DC | PRN

## 2012-01-07 MED ORDER — INSULIN ASPART 100 UNIT/ML ~~LOC~~ SOLN
6.0000 [IU] | Freq: Three times a day (TID) | SUBCUTANEOUS | Status: DC
  Administered 2012-01-07 – 2012-01-08 (×2): 6 [IU] via SUBCUTANEOUS

## 2012-01-07 MED ORDER — SODIUM CHLORIDE 0.9 % IV SOLN
Freq: Once | INTRAVENOUS | Status: AC
  Administered 2012-01-07: 09:00:00 via INTRAVENOUS

## 2012-01-07 MED ORDER — SODIUM CHLORIDE 0.9 % IJ SOLN
3.0000 mL | Freq: Two times a day (BID) | INTRAMUSCULAR | Status: DC
  Administered 2012-01-07: 3 mL via INTRAVENOUS
  Filled 2012-01-07: qty 3

## 2012-01-07 MED ORDER — KETOROLAC TROMETHAMINE 30 MG/ML IJ SOLN
30.0000 mg | Freq: Once | INTRAMUSCULAR | Status: AC
Start: 2012-01-07 — End: 2012-01-07
  Administered 2012-01-07: 30 mg via INTRAVENOUS
  Filled 2012-01-07: qty 1

## 2012-01-07 MED ORDER — LORATADINE 10 MG PO TABS
10.0000 mg | ORAL_TABLET | Freq: Every day | ORAL | Status: DC
  Administered 2012-01-07: 10 mg via ORAL
  Filled 2012-01-07: qty 1

## 2012-01-07 MED ORDER — FUROSEMIDE 10 MG/ML IJ SOLN
40.0000 mg | Freq: Every day | INTRAMUSCULAR | Status: DC
  Administered 2012-01-07: 40 mg via INTRAVENOUS
  Filled 2012-01-07: qty 4

## 2012-01-07 MED ORDER — ASPIRIN EC 325 MG PO TBEC
325.0000 mg | DELAYED_RELEASE_TABLET | Freq: Every morning | ORAL | Status: DC
  Administered 2012-01-07: 325 mg via ORAL
  Filled 2012-01-07: qty 1

## 2012-01-07 MED ORDER — HYDROCODONE-ACETAMINOPHEN 5-325 MG PO TABS
1.0000 | ORAL_TABLET | ORAL | Status: DC | PRN
  Administered 2012-01-08: 2 via ORAL
  Filled 2012-01-07: qty 2

## 2012-01-07 MED ORDER — ACETAMINOPHEN 650 MG RE SUPP: 650.0000 mg | Freq: Four times a day (QID) | RECTAL | Status: DC | PRN

## 2012-01-07 MED ORDER — FLUTICASONE-SALMETEROL 250-50 MCG/DOSE IN AEPB
1.0000 | INHALATION_SPRAY | Freq: Two times a day (BID) | RESPIRATORY_TRACT | Status: DC
  Administered 2012-01-07 – 2012-01-08 (×2): 1 via RESPIRATORY_TRACT
  Filled 2012-01-07: qty 14

## 2012-01-07 MED ORDER — SPIRONOLACTONE 25 MG PO TABS
12.5000 mg | ORAL_TABLET | Freq: Every morning | ORAL | Status: DC
Start: 2012-01-07 — End: 2012-01-08
  Administered 2012-01-07: 12.5 mg via ORAL
  Filled 2012-01-07: qty 1

## 2012-01-07 MED ORDER — ENOXAPARIN SODIUM 40 MG/0.4ML ~~LOC~~ SOLN
40.0000 mg | SUBCUTANEOUS | Status: DC
  Administered 2012-01-07: 40 mg via SUBCUTANEOUS
  Filled 2012-01-07: qty 0.4

## 2012-01-07 MED ORDER — SENNOSIDES-DOCUSATE SODIUM 8.6-50 MG PO TABS: 1.0000 | ORAL_TABLET | Freq: Every evening | ORAL | Status: DC | PRN

## 2012-01-07 MED ORDER — SODIUM CHLORIDE 0.9 % IV SOLN: 250.0000 mL | INTRAVENOUS | Status: DC | PRN

## 2012-01-07 MED ORDER — ONDANSETRON HCL 4 MG/2ML IJ SOLN: 4.0000 mg | Freq: Four times a day (QID) | INTRAMUSCULAR | Status: DC | PRN

## 2012-01-07 MED ORDER — CARVEDILOL 12.5 MG PO TABS
12.5000 mg | ORAL_TABLET | Freq: Two times a day (BID) | ORAL | Status: DC
Start: 2012-01-07 — End: 2012-01-08
  Administered 2012-01-07 – 2012-01-08 (×2): 12.5 mg via ORAL
  Filled 2012-01-07 (×2): qty 1

## 2012-01-07 MED ORDER — IOHEXOL 350 MG/ML SOLN
100.0000 mL | Freq: Once | INTRAVENOUS | Status: AC | PRN
  Administered 2012-01-07: 100 mL via INTRAVENOUS

## 2012-01-07 MED ORDER — INSULIN ASPART 100 UNIT/ML ~~LOC~~ SOLN
0.0000 [IU] | Freq: Every day | SUBCUTANEOUS | Status: DC
  Administered 2012-01-07: 5 [IU] via SUBCUTANEOUS

## 2012-01-07 MED ORDER — IBUPROFEN 400 MG PO TABS
400.0000 mg | ORAL_TABLET | Freq: Three times a day (TID) | ORAL | Status: DC
  Administered 2012-01-07: 400 mg via ORAL
  Filled 2012-01-07 (×3): qty 1

## 2012-01-07 MED ORDER — PANTOPRAZOLE SODIUM 40 MG PO TBEC: 40.0000 mg | DELAYED_RELEASE_TABLET | Freq: Every day | ORAL | Status: DC

## 2012-01-07 MED ORDER — DEXTROSE 5 % IV SOLN
1.0000 g | Freq: Once | INTRAVENOUS | Status: AC
  Administered 2012-01-07: 1 g via INTRAVENOUS
  Filled 2012-01-07: qty 10

## 2012-01-07 MED ORDER — ALUM & MAG HYDROXIDE-SIMETH 200-200-20 MG/5ML PO SUSP: 30.0000 mL | Freq: Four times a day (QID) | ORAL | Status: DC | PRN

## 2012-01-07 MED ORDER — TIOTROPIUM BROMIDE MONOHYDRATE 18 MCG IN CAPS
18.0000 ug | ORAL_CAPSULE | Freq: Every day | RESPIRATORY_TRACT | Status: DC
  Administered 2012-01-08: 18 ug via RESPIRATORY_TRACT
  Filled 2012-01-07: qty 5

## 2012-01-07 MED ORDER — ASPIRIN 81 MG PO CHEW
324.0000 mg | CHEWABLE_TABLET | Freq: Once | ORAL | Status: AC
  Administered 2012-01-07: 324 mg via ORAL
  Filled 2012-01-07: qty 4

## 2012-01-07 MED ORDER — ALBUTEROL SULFATE (5 MG/ML) 0.5% IN NEBU: 2.5000 mg | INHALATION_SOLUTION | RESPIRATORY_TRACT | Status: DC | PRN

## 2012-01-07 MED ORDER — INSULIN DETEMIR 100 UNIT/ML ~~LOC~~ SOLN
44.0000 [IU] | Freq: Every day | SUBCUTANEOUS | Status: DC
  Administered 2012-01-07: 44 [IU] via SUBCUTANEOUS
  Filled 2012-01-07: qty 10

## 2012-01-07 MED ORDER — SODIUM CHLORIDE 0.9 % IJ SOLN: 3.0000 mL | INTRAMUSCULAR | Status: DC | PRN

## 2012-01-07 MED ORDER — AZITHROMYCIN 250 MG PO TABS
500.0000 mg | ORAL_TABLET | Freq: Once | ORAL | Status: AC
  Administered 2012-01-07: 500 mg via ORAL
  Filled 2012-01-07: qty 2

## 2012-01-07 NOTE — ED Notes (Signed)
Report given to Jessica, RN on 300 

## 2012-01-07 NOTE — H&P (Signed)
Hospital Admission Note Date: 01/07/2012  Patient name: Kathryn Morgan Medical record number: [id number] Date of birth: [birth year] Age: 71 y.o. Gender: female PCP: [name], MD  Attending physician: Christiane Ha, MD  Chief Complaint: chest pain  History of Present Illness:  Kathryn Morgan is an 71 y.o. female who presents with sharp left chest pain.  Worse with inspiration, lying on left side. Lasts a few seconds.  Started 4 days ago.  Dyspnea started 2 days ago, mainly with exertion.  No cough, fever, chills, wheezing, or swelling.  Has gained 8 pounds recently.  Has h/o COPD, CHF with EF 35-40%.  Takes medications as directed.  No recent dietary indiscretion. CT angio of chest shows no PE, new infiltrate, pericardial effusion.  Oxygen sats with exertion drop to 86 %  Past Medical History  Diagnosis Date  . Type 2 diabetes mellitus     Insulin pump  . Essential hypertension, benign   . Dyslipidemia   . Depression   . Necrotizing pancreatitis   . Neuropathy   . Rhabdomyolysis   . Gallstone pancreatitis     2005  . Chronic systolic heart failure     LVEF 35%  . Aortic stenosis     Moderate to severe  . Respiratory failure     Home O2 ordered 05/2011  . Right bundle-branch block   . Rheumatic fever   . Obesity (BMI 30-39.9) 08/10/2011  . Osteoporosis 08/10/2011  . GERD (gastroesophageal reflux disease)   . Allergy   . CHF (congestive heart failure)   . Heart murmur     AORTIC STENOSIS  . COPD (chronic obstructive pulmonary disease)   . Shortness of breath     WITH EXERTION, DRY COUGH   . Diabetes mellitus   . Blood dyscrasia     VIT D DEF  . Hepatitis     2005 OR 06  ? HEP C    . Blood transfusion   . Chronic kidney disease     KIDNEYS WORKING 50 PERCENT   . Arthritis     PRE OP RT LEG  . S/P aortic valve replacement 08/27/2011    23mm [facility] Ease pericardial tissue valve    Meds: Prescriptions prior to admission  Medication Sig Dispense  Refill  . aspirin EC 325 MG tablet Take 325 mg by mouth every morning.       . carvedilol (COREG) 12.5 MG tablet Take 1 tablet (12.5 mg total) by mouth 2 (two) times daily with a meal.  180 tablet  3  . cetirizine (ZYRTEC) 10 MG tablet Take 10 mg by mouth at bedtime.       . docusate sodium (COLACE) 100 MG capsule Take 100 mg by mouth 2 (two) times daily.        . fish oil-omega-3 fatty acids 1000 MG capsule Take 1 g by mouth 2 (two) times daily.       . Fluticasone-Salmeterol (ADVAIR DISKUS) 250-50 MCG/DOSE AEPB Inhale 1 puff into the lungs every 12 (twelve) hours.        . furosemide (LASIX) 20 MG tablet Take 20 mg by mouth every morning.       Marland Kitchen guaiFENesin (MUCINEX) 600 MG 12 hr tablet Take 1,200 mg by mouth 2 (two) times daily.      . insulin detemir (LEVEMIR) 100 UNIT/ML injection Inject 44 Units into the skin at bedtime.       . Insulin Lispro, Human, (HUMALOG KWIKPEN [place]) Inject 10-16  Units into the skin 3 (three) times daily with meals. Per sliding scale      . Probiotic Product (PROBIOTIC FORMULA PO) Take 1 capsule by mouth every morning.       Marland Kitchen PROTONIX 40 MG tablet TAKE 1 TABLET BY MOUTH TWICE DAILY BEFORE A MEAL.  60 each  5  . simvastatin (ZOCOR) 20 MG tablet Take 20 mg by mouth at bedtime.        . sodium chloride (OCEAN) 0.65 % nasal spray Place 1 spray into the nose daily as needed. For dryness      . spironolactone (ALDACTONE) 25 MG tablet Take 12.5 mg by mouth every morning.      . tiotropium (SPIRIVA) 18 MCG inhalation capsule Place 18 mcg into inhaler and inhale daily.          Allergies: Codeine; Promethazine hcl; Ace inhibitors; Adhesive; and Levofloxacin History   Social History  . Marital Status: Married    Spouse Name: N/A    Number of Children: N/A  . Years of Education: N/A   Occupational History  . Not on file.   Social History Main Topics  . Smoking status: Former Smoker -- 3.0 packs/day for 15 years    Types: Cigarettes    Quit date: 06/09/1981  .  Smokeless tobacco: Not on file   Comment: quit about 20+ yrs ago  . Alcohol Use: No  . Drug Use: No  . Sexually Active: Not on file   Other Topics Concern  . Not on file   Social History Narrative  . No narrative on file   Family History  Problem Relation Age of Onset  . Colon cancer Neg Hx   . Anesthesia problems Neg Hx   . Heart disease      CHF  . Heart attack Father    Past Surgical History  Procedure Date  . Esophagogastroduodenoscopy 04/21/04    Normal esophagus/normal D1,D2; attempted  transgastric drainage/stent placement of pancreatic pseudocyst, no obvious location for gastrotomy, therefore transferred to [facility]  . Debridement pancreas   . Pancreatic pseudocyst drainage   . Cholecystectomy   . Cardiac catheterization     DR MCDOWELL   . Aortic valve replacement 08/27/2011    Procedure: AORTIC VALVE REPLACEMENT (AVR);  Surgeon: Purcell Nails, MD;  Location: [facility] OR;  Service: Open Heart Surgery;  Laterality: N/A;    Review of Systems: Systems reviewed and as per HPI, otherwise negative.  Physical Exam: Blood pressure 104/65, pulse 87, temperature 98 F (36.7 C), temperature source Oral, resp. rate 16, height 5\' 4"  (1.626 m), weight 86.183 kg (190 lb), SpO2 99.00%. BP 115/69  Pulse 78  Temp 98.4 F (36.9 C) (Oral)  Resp 18  Ht 5\' 4"  (1.626 m)  Wt 86.183 kg (190 lb)  BMI 32.61 kg/m2  SpO2 96%  General Appearance:    Alert, cooperative, no distress, appears stated age  Head:    Normocephalic, without obvious abnormality, atraumatic  Eyes:    PERRL, conjunctiva/corneas clear, EOM's intact, fundi    benign, both eyes  Ears:    Normal TM's and external ear canals, both ears  Nose:   Nares normal, septum midline, mucosa normal, no drainage    or sinus tenderness  Throat:   Lips, mucosa, and tongue normal; teeth and gums normal  Neck:   Supple, symmetrical, trachea midline, no adenopathy;    thyroid:  no enlargement/tenderness/nodules; no carotid   bruit or  JVD  Back:  Symmetric, no curvature, ROM normal, no CVA tenderness  Lungs:     Clear to auscultation bilaterally, respirations unlabored  Chest Wall:    Left chest wall tenderness   Heart:    Regular rate and rhythm, S1 and S2 normal, no murmur, rub   or gallop     Abdomen:     Soft, non-tender, bowel sounds active, obese   no masses, no organomegaly  Genitalia:   deferred  Rectal:   deferred  Extremities:   Extremities normal, atraumatic, no cyanosis or edema  Pulses:   2+ and symmetric all extremities  Skin:   Skin color, texture, turgor normal, no rashes or lesions  Lymph nodes:   Cervical, supraclavicular, and axillary nodes normal  Neurologic:   CNII-XII intact, normal strength, sensation and reflexes    throughout    Psychiatric:  Normal affect  Lab results: Basic Metabolic Panel:  Basename 01/07/12 0650 01/07/12 0646  NA 135 136  K 4.8 4.9  CL 97 101  CO2 31 --  GLUCOSE 231* 225*  BUN 26* 30*  CREATININE 0.98 1.10  CALCIUM 9.0 --  MG -- --  PHOS -- --   Liver Function Tests: No results found for this basename: AST:2,ALT:2,ALKPHOS:2,BILITOT:2,PROT:2,ALBUMIN:2 in the last 72 hours No results found for this basename: LIPASE:2,AMYLASE:2 in the last 72 hours No results found for this basename: AMMONIA:2 in the last 72 hours CBC:  Basename 01/07/12 0650 01/07/12 0646  WBC 9.7 --  NEUTROABS -- --  HGB 11.8* 12.6  HCT 36.0 37.0  MCV 81.4 --  PLT 168 --   Cardiac Enzymes: No results found for this basename: CKTOTAL:3,CKMB:3,CKMBINDEX:3,TROPONINI:3 in the last 72 hours BNP: No results found for this basename: PROBNP:3 in the last 72 hours D-Dimer:  [facility] 01/07/12 0650  DDIMER 0.85*   CBG:  Basename 01/07/12 1017  GLUCAP 196*   Imaging results:  Dg Chest 2 View  01/07/2012  *RADIOLOGY REPORT*  Clinical Data: Left chest pain.  CHEST - 2 VIEW  Comparison: 01/07/2012 and 10/18/2011  Findings: Two views of the chest were obtained.  Again noted are left  basilar densities and enlargement of the cardiac silhouette. The upper lungs remain clear.  Evidence for an aortic valve surgery.  Lateral view demonstrates densities at the costophrenic angle and findings suggest pleural fluid.  Bony thorax appears intact.  IMPRESSION: Left basilar densities are suggestive for atelectasis and small pleural effusion.  Original Report Authenticated By: Richarda Overlie, M.D.   Ct Angio Chest Pe W/cm &/or Wo Cm  01/07/2012  *RADIOLOGY REPORT*  Clinical Data: Chest pain  CT ANGIOGRAPHY CHEST  Technique:  Multidetector CT imaging of the chest using the standard protocol during bolus administration of intravenous contrast. Multiplanar reconstructed images including MIPs were obtained and reviewed to evaluate the vascular anatomy.  Contrast: OMNIPAQUE IOHEXOL 350 MG/ML SOLN  Comparison: 08/17/2011  Findings: No filling defects in the pulmonary arterial tree to suggest acute pulmonary thromboembolism.  Negative abnormal mediastinal adenopathy.  Negative pericardial effusion.  No pneumothorax.  Tiny left pleural effusion.  Nonspecific 6 mm patchy density in the right lower lobe is stable compared with March of this year.  It is not significantly changed compared with 2005.  Dependent atelectasis at the lung bases.  Aortic valve prosthetic component in place.  Sternal wires in place.  There is some bony bridging within the mid sternum.  The remainder of the sternotomy has not healed yet.  T5-6 calcified disc herniation effaces the anterior thecal  sac.  IMPRESSION: No evidence of acute pulmonary thromboembolism.  Original Report Authenticated By: Donavan Burnet, M.D.   Chest Portable 1 View  01/07/2012  *RADIOLOGY REPORT*  Clinical Data: Pain under the left breast.  History of aortic valve surgery.  PORTABLE CHEST - 1 VIEW  Comparison: 10/18/2011  Findings: The heart appears enlarged on this single view.  Cannot exclude densities at the left lung base.  There is no evidence for pulmonary  edema.  The right lung is clear.  IMPRESSION: Cardiomegaly and left basilar densities.  Left basilar densities could be related to overlying soft tissue but cannot exclude densities or pleural fluid.  This area may be further evaluated with PA and lateral views.  Original Report Authenticated By: Richarda Overlie, M.D.   EKG:  Normal sinus rhythm Possible Left atrial enlargement Right bundle branch block (old)  Assessment & Plan: Principal Problem:  *Hypoxia Active Problems:  Dyspnea  DM (diabetes mellitus), type 2, uncontrolled  Benign essential HTN  COPD (chronic obstructive pulmonary disease)  Secondary cardiomyopathy, unspecified  Chest wall pain  Mixed hyperlipidemia  Obesity (BMI 30-39.9)  S/P aortic valve replacement  No evidence of pneumonia, PE, COPD exacerbation.  Has gained 8 pounds, so will check pro BNP and increase diuretics.  Chest pain is musculoskeletal,  Will give NSAIDs today.  Monitor blood glucose and adjust meds as needed.  Braylinn Gulden L 01/07/2012, 12:49 PM

## 2012-01-07 NOTE — ED Notes (Signed)
Pt given handout of pleural effusion

## 2012-01-07 NOTE — ED Notes (Signed)
Pt requesting her blood sugar to be checked,

## 2012-01-07 NOTE — ED Notes (Signed)
Dr. Bebe Shaggy in room talking with pt

## 2012-01-07 NOTE — ED Provider Notes (Signed)
History   This chart was scribed for Kathryn Gaskins, MD by Charolett Bumpers . The patient was seen in room APA10/APA10. Patient's care was started at 0659.    CSN: [id number]  Arrival date & time 01/07/12  5284   First MD Initiated Contact with Patient 01/07/12 [phone]      Chief Complaint  Patient presents with  . Chest Pain     HPI Kathryn Morgan is a 71 y.o. female who presents to the Emergency Department complaining of intermittent, moderate chest pain that started 4 days ago. Pt describes the chest pain as shooting. Pt states that her pain started as in her back 4 nights ago that radiated to her chest. Pt reports associated nausea and SOB. Pt denies diaphoresis, vomiting, syncope, headaches, vision changes or weakness/numbness in her extremities. Pt reports that her symptoms are aggravated with walking, laughing, breathing and at times with movement. Pt denies any aggravation with breathing. Pt reports that she has a h/o valve replacement in March 2013 and states that she has had cardiac rehab 3 times weekly for the past 3 months which includes walking on treadmill and lifting weights. Pt states that her symptoms started after rehab on Monday.  She takes an aspirin daily. Pt states that she took oxycodone without relief. Pt reports a h/o COPD. Pt denies smoking and states that she is not on O2 at home. Pt states that she takes Advair.  PCP: Dr. Regino Schultze   Past Medical History  Diagnosis Date  . Type 2 diabetes mellitus     Insulin pump  . Essential hypertension, benign   . Dyslipidemia   . Depression   . Necrotizing pancreatitis   . Neuropathy   . Rhabdomyolysis   . Gallstone pancreatitis     2005  . Chronic systolic heart failure     LVEF 35%  . Aortic stenosis     Moderate to severe  . Respiratory failure     Home O2 ordered 05/2011  . Right bundle-branch block   . Rheumatic fever   . Obesity (BMI 30-39.9) 08/10/2011  . Osteoporosis 08/10/2011  . GERD  (gastroesophageal reflux disease)   . Allergy   . CHF (congestive heart failure)   . Heart murmur     AORTIC STENOSIS  . COPD (chronic obstructive pulmonary disease)   . Shortness of breath     WITH EXERTION, DRY COUGH   . Diabetes mellitus   . Blood dyscrasia     VIT D DEF  . Hepatitis     2005 OR 06  ? HEP C    . Blood transfusion   . Chronic kidney disease     KIDNEYS WORKING 50 PERCENT   . Arthritis     PRE OP RT LEG  . S/P aortic valve replacement 08/27/2011    23mm [facility] Ease pericardial tissue valve    Past Surgical History  Procedure Date  . Esophagogastroduodenoscopy 04/21/04    Normal esophagus/normal D1,D2; attempted  transgastric drainage/stent placement of pancreatic pseudocyst, no obvious location for gastrotomy, therefore transferred to [facility]  . Debridement pancreas   . Pancreatic pseudocyst drainage   . Cholecystectomy   . Cardiac catheterization     DR MCDOWELL   . Aortic valve replacement 08/27/2011    Procedure: AORTIC VALVE REPLACEMENT (AVR);  Surgeon: Purcell Nails, MD;  Location: [facility] OR;  Service: Open Heart Surgery;  Laterality: N/A;    Family History  Problem Relation Age of Onset  .  Colon cancer Neg Hx   . Anesthesia problems Neg Hx   . Heart disease      CHF  . Heart attack Father     History  Substance Use Topics  . Smoking status: Former Smoker -- 3.0 packs/day for 15 years    Types: Cigarettes    Quit date: 06/09/1981  . Smokeless tobacco: Not on file   Comment: quit about 20+ yrs ago  . Alcohol Use: No    OB History    Grav Para Term Preterm Abortions TAB SAB Ect Mult Living                  Review of Systems  Constitutional: Negative for diaphoresis.  Eyes: Negative for visual disturbance.  Respiratory: Positive for shortness of breath.   Cardiovascular: Positive for chest pain.  Gastrointestinal: Positive for nausea. Negative for vomiting.  Musculoskeletal: Positive for back pain.  Neurological: Negative for  syncope, weakness, numbness and headaches.  All other systems reviewed and are negative.    Allergies  Codeine; Promethazine hcl; Ace inhibitors; and Levofloxacin  Home Medications   Current Outpatient Rx  Name Route Sig Dispense Refill  . ALENDRONATE SODIUM 35 MG PO TABS Oral Take 70 mg by mouth every 7 (seven) days.     . ASPIRIN EC 325 MG PO TBEC Oral Take 325 mg by mouth daily.    Marland Kitchen CARVEDILOL 12.5 MG PO TABS Oral Take 1 tablet (12.5 mg total) by mouth 2 (two) times daily with a meal. 180 tablet 3  . CETIRIZINE HCL 10 MG PO TABS Oral Take 10 mg by mouth daily.    Marland Kitchen DOCUSATE SODIUM 100 MG PO CAPS Oral Take 100 mg by mouth 2 (two) times daily.      . OMEGA-3 FATTY ACIDS 1000 MG PO CAPS Oral Take 2 g by mouth daily.    Marland Kitchen FLUTICASONE-SALMETEROL 250-50 MCG/DOSE IN AEPB Inhalation Inhale 1 puff into the lungs every 12 (twelve) hours.      . FUROSEMIDE 20 MG PO TABS Oral Take 20 mg by mouth daily.    . GUAIFENESIN ER 600 MG PO TB12 Oral Take 1,200 mg by mouth 2 (two) times daily.    . INSULIN DETEMIR 100 UNIT/ML [place] SOLN Subcutaneous Inject 44 Units into the skin as directed.     Marland Kitchen HUMALOG KWIKPEN [place] Subcutaneous Inject 10-16 Units into the skin 3 (three) times daily with meals. Per sliding scale    . ADULT MULTIVITAMIN W/MINERALS CH Oral Take 1 tablet by mouth daily.    Marland Kitchen PROBIOTIC FORMULA PO Oral Take 1 tablet by mouth daily.    Marland Kitchen PROTONIX 40 MG PO TBEC  TAKE 1 TABLET BY MOUTH TWICE DAILY BEFORE A MEAL. 60 each 5  . SIMVASTATIN 20 MG PO TABS Oral Take 20 mg by mouth at bedtime.      Marland Kitchen SALINE NASAL SPRAY 0.65 % NA SOLN Nasal Place 1 spray into the nose daily as needed. For dryness    . SPIRONOLACTONE 25 MG PO TABS Oral Take 0.5 tablets (12.5 mg total) by mouth daily. 45 tablet 3  . TIOTROPIUM BROMIDE MONOHYDRATE 18 MCG IN CAPS Inhalation Place 18 mcg into inhaler and inhale daily.        Pulse 96  Temp 99.2 F (37.3 C) (Oral)  Resp 24  Ht 5\' 4"  (1.626 m)  Wt 189 lb (85.73 kg)  BMI  32.44 kg/m2  SpO2 93%  Physical Exam CONSTITUTIONAL: Well developed/well nourished HEAD AND  FACE: Normocephalic/atraumatic EYES: EOMI/PERRL ENMT: Mucous membranes moist NECK: supple no meningeal signs SPINE:entire spine nontender CV: S1/S2 noted, no loud harsh murmurs Chest - nontender, no bruising noted to chest LUNGS: coarse BS noted bilaterally ABDOMEN: soft, nontender, no rebound or guarding GU:no cva tenderness NEURO: Pt is awake/alert, moves all extremitiesx4, no focal motor deficits noted EXTREMITIES: pulses normal, full ROM SKIN: warm, color normal PSYCH: no abnormalities of mood noted  ED Course  Procedures   DIAGNOSTIC STUDIES: Oxygen Saturation is 93% on [place], adequate by my interpretation.    COORDINATION OF CARE:  07:15-Medication Orders: Aspirin chewable tablet 324 mg-once  07:18-Discussed planned course of treatment with the patient, who is agreeable at this time.  Pt with h/o aortic stenosis s/p repair, also has h/o negative cardiac cath last year with nonischemic cardiomyopathy.  Will repeat CXR to include PA/Lateral for further characterization of lung fields  8:17 AM CXR shows pleural effusion.  She reports pleuritic type CP on repeat exam (I am unable to reproduce pain, no signs of bruising to chest)  She also had oxygen desat after walking (no longer on home oxygen)  She reports that this is a significant change for her as she did well post op.  PE is a possibility, but even if ruled out will need admission due to pain/hypoxia.    D/w dr Lendell Caprice, will admit after full workup for PE.  Pt stable at this time.  I discussed this with patient.   Results for orders placed during the hospital encounter of 01/07/12  CBC      Component Value Range   WBC 9.7  4.0 - 10.5 K/uL   RBC 4.42  3.87 - 5.11 MIL/uL   Hemoglobin 11.8 (*) 12.0 - 15.0 g/dL   HCT 16.1  09.6 - 04.5 %   MCV 81.4  78.0 - 100.0 fL   MCH 26.7  26.0 - 34.0 pg   MCHC 32.8  30.0 - 36.0 g/dL   RDW  40.9 (*) 81.1 - 15.5 %   Platelets 168  150 - 400 K/uL  BASIC METABOLIC PANEL      Component Value Range   Sodium 135  135 - 145 mEq/L   Potassium 4.8  3.5 - 5.1 mEq/L   Chloride 97  96 - 112 mEq/L   CO2 31  19 - 32 mEq/L   Glucose, Bld 231 (*) 70 - 99 mg/dL   BUN 26 (*) 6 - 23 mg/dL   Creatinine, Ser 9.14  0.50 - 1.10 mg/dL   Calcium 9.0  8.4 - 78.2 mg/dL   GFR calc non Af Amer 58 (*) >90 mL/min   GFR calc Af Amer 67 (*) >90 mL/min  POCT I-STAT, CHEM 8      Component Value Range   Sodium 136  135 - 145 mEq/L   Potassium 4.9  3.5 - 5.1 mEq/L   Chloride 101  96 - 112 mEq/L   BUN 30 (*) 6 - 23 mg/dL   Creatinine, Ser 9.56  0.50 - 1.10 mg/dL   Glucose, Bld 213 (*) 70 - 99 mg/dL   Calcium, Ion 0.86  5.78 - 1.30 mmol/L   TCO2 27  0 - 100 mmol/L   Hemoglobin 12.6  12.0 - 15.0 g/dL   HCT 46.9  62.9 - 52.8 %  POCT I-STAT TROPONIN I      Component Value Range   Troponin i, poc 0.00  0.00 - 0.08 ng/mL   Comment 3  Dg Chest 2 View  01/07/2012  *RADIOLOGY REPORT*  Clinical Data: Left chest pain.  CHEST - 2 VIEW  Comparison: 01/07/2012 and 10/18/2011  Findings: Two views of the chest were obtained.  Again noted are left basilar densities and enlargement of the cardiac silhouette. The upper lungs remain clear.  Evidence for an aortic valve surgery.  Lateral view demonstrates densities at the costophrenic angle and findings suggest pleural fluid.  Bony thorax appears intact.  IMPRESSION: Left basilar densities are suggestive for atelectasis and small pleural effusion.  Original Report Authenticated By: Richarda Overlie, M.D.   Chest Portable 1 View  01/07/2012  *RADIOLOGY REPORT*  Clinical Data: Pain under the left breast.  History of aortic valve surgery.  PORTABLE CHEST - 1 VIEW  Comparison: 10/18/2011  Findings: The heart appears enlarged on this single view.  Cannot exclude densities at the left lung base.  There is no evidence for pulmonary edema.  The right lung is clear.  IMPRESSION:  Cardiomegaly and left basilar densities.  Left basilar densities could be related to overlying soft tissue but cannot exclude densities or pleural fluid.  This area may be further evaluated with PA and lateral views.  Original Report Authenticated By: Richarda Overlie, M.D.      MDM  Nursing notes including past medical history and social history reviewed and considered in documentation xrays reviewed and considered Labs/vital reviewed and considered Previous records reviewed and considered     Date: 01/07/2012  Rate: 96  Rhythm: normal sinus rhythm  QRS Axis: left  Intervals: normal  ST/T Wave abnormalities: nonspecific ST changes  Conduction Disutrbances:right bundle branch block  Narrative Interpretation:   Old EKG Reviewed: unchanged     I personally performed the services described in this documentation, which was scribed in my presence. The recorded information has been reviewed and considered.         Kathryn Gaskins, MD 01/07/12 [phone]

## 2012-01-07 NOTE — ED Notes (Signed)
Patient walked to the nurses station and back O2 dropped to 84% Room air. RN made aware.

## 2012-01-07 NOTE — ED Notes (Signed)
Pt pain free at present, states that she did experience a sharp stabbing type pain that was located at left lower rib cage area while she was coming back from xray.

## 2012-01-08 DIAGNOSIS — I5023 Acute on chronic systolic (congestive) heart failure: Secondary | ICD-10-CM | POA: Diagnosis present

## 2012-01-08 LAB — GLUCOSE, CAPILLARY
Glucose-Capillary: 378 mg/dL — ABNORMAL HIGH (ref 70–99)
Glucose-Capillary: 218 mg/dL — ABNORMAL HIGH (ref 70–99)

## 2012-01-08 MED ORDER — HYDROCODONE-ACETAMINOPHEN 5-325 MG PO TABS: 1.0000 | ORAL_TABLET | Freq: Four times a day (QID) | ORAL | Status: AC | PRN

## 2012-01-08 MED ORDER — SODIUM CHLORIDE 0.9 % IJ SOLN
INTRAMUSCULAR | Status: AC
  Filled 2012-01-08: qty 3

## 2012-01-08 MED ORDER — KETOROLAC TROMETHAMINE 30 MG/ML IJ SOLN
30.0000 mg | Freq: Once | INTRAMUSCULAR | Status: AC
  Administered 2012-01-08: 30 mg via INTRAVENOUS
  Filled 2012-01-08: qty 1

## 2012-01-08 MED ORDER — FUROSEMIDE 20 MG PO TABS: 40.0000 mg | ORAL_TABLET | Freq: Every morning | ORAL | Status: DC

## 2012-01-08 MED ORDER — FUROSEMIDE 10 MG/ML IJ SOLN
40.0000 mg | Freq: Every day | INTRAMUSCULAR | Status: DC
  Administered 2012-01-08: 40 mg via INTRAVENOUS
  Filled 2012-01-08: qty 4

## 2012-01-08 MED ORDER — ACETAMINOPHEN 325 MG PO TABS: 650.0000 mg | ORAL_TABLET | Freq: Four times a day (QID) | ORAL | Status: DC | PRN

## 2012-01-08 MED ORDER — IBUPROFEN 200 MG PO TABS: 400.0000 mg | ORAL_TABLET | Freq: Three times a day (TID) | ORAL | Status: AC

## 2012-01-08 NOTE — Discharge Summary (Signed)
Physician Discharge Summary  Patient ID: AUDRAY RUMORE MRN: [id number] DOB/AGE: 1941-05-08 71 y.o.  Admit date: 01/07/2012 Discharge date: 01/08/2012  Discharge Diagnoses:  Principal Problem:  *Hypoxia Active Problems:  Dyspnea  Acute on chronic systolic CHF, mild  DM (diabetes mellitus), type 2, uncontrolled  Benign essential HTN  COPD (chronic obstructive pulmonary disease)  Secondary cardiomyopathy, unspecified  Chest wall pain  Mixed hyperlipidemia  Obesity (BMI 30-39.9)  S/P aortic valve replacement   Medication List  As of 01/08/2012  9:30 AM   TAKE these medications         acetaminophen 325 MG tablet   Commonly known as: TYLENOL   Take 2 tablets (650 mg total) by mouth every 6 (six) hours as needed (or Fever >/= 101).      ADVAIR DISKUS 250-50 MCG/DOSE Aepb   Generic drug: Fluticasone-Salmeterol   Inhale 1 puff into the lungs every 12 (twelve) hours.      aspirin EC 325 MG tablet   Take 325 mg by mouth every morning.      carvedilol 12.5 MG tablet   Commonly known as: COREG   Take 1 tablet (12.5 mg total) by mouth 2 (two) times daily with a meal.      cetirizine 10 MG tablet   Commonly known as: ZYRTEC   Take 10 mg by mouth at bedtime.      docusate sodium 100 MG capsule   Commonly known as: COLACE   Take 100 mg by mouth 2 (two) times daily.      fish oil-omega-3 fatty acids 1000 MG capsule   Take 1 g by mouth 2 (two) times daily.      furosemide 20 MG tablet   Commonly known as: LASIX   Take 2 tablets (40 mg total) by mouth every morning. Until back to target weight (about one week)      guaiFENesin 600 MG 12 hr tablet   Commonly known as: MUCINEX   Take 1,200 mg by mouth 2 (two) times daily.      HUMALOG KWIKPEN [place]   Inject 10-16 Units into the skin 3 (three) times daily with meals. Per sliding scale      HYDROcodone-acetaminophen 5-325 MG per tablet   Commonly known as: NORCO/VICODIN   Take 1 tablet by mouth every 6 (six) hours as needed  (severe pain).      ibuprofen 200 MG tablet   Commonly known as: ADVIL,MOTRIN   Take 2 tablets (400 mg total) by mouth 3 (three) times daily with meals. For 2 days only      insulin detemir 100 UNIT/ML injection   Commonly known as: LEVEMIR   Inject 44 Units into the skin at bedtime.      PROBIOTIC FORMULA PO   Take 1 capsule by mouth every morning.      PROTONIX 40 MG tablet   Generic drug: pantoprazole   TAKE 1 TABLET BY MOUTH TWICE DAILY BEFORE A MEAL.      simvastatin 20 MG tablet   Commonly known as: ZOCOR   Take 20 mg by mouth at bedtime.      sodium chloride 0.65 % nasal spray   Commonly known as: OCEAN   Place 1 spray into the nose daily as needed. For dryness      spironolactone 25 MG tablet   Commonly known as: ALDACTONE   Take 12.5 mg by mouth every morning.      tiotropium 18 MCG inhalation capsule   Commonly known  as: SPIRIVA   Place 18 mcg into inhaler and inhale daily.            Discharge Orders    Future Appointments: Provider: Department: Dept Phone: Center:   01/10/2012 11:00 AM [facility] 3 [facility] [phone] [facility]   01/12/2012 11:00 AM [facility] Monitor 3 [facility] [phone] [facility]   01/14/2012 11:00 AM [facility] Monitor 3 [facility] [phone] [facility]   01/17/2012 11:00 AM [facility] Monitor 3 [facility] [phone] [facility]   01/19/2012 11:00 AM [facility] Monitor 3 [facility] [phone] [facility]   01/21/2012 11:00 AM [facility] Monitor 3 [facility] [phone] [facility]   01/26/2012 10:40 AM Jonelle Sidle, MD [facility] [phone] [id number]     Future Orders Please Complete By Expires   Diet - low sodium heart healthy      Diet Carb Modified      Activity as tolerated - No restrictions         Follow-up Information    Follow up with Nona Dell, MD. (as previously scheduled)    Contact information:   [address] [place]  [address] [phone]       Follow up with KERR,JEFFREY, MD. (As needed for diabetes care)    Contact information:   [address] [facility] [place] [address] [phone]          Disposition: 01-Home or Self Care  Discharged Condition: stable  Consults:  none  Labs:   Results for orders placed during the hospital encounter of 01/07/12 (from the past 48 hour(s))  POCT I-STAT TROPONIN I     Status: Normal   Collection Time   01/07/12  6:43 AM      Component Value Range Comment   Troponin i, poc 0.00  0.00 - 0.08 ng/mL    Comment 3            POCT I-STAT, CHEM 8     Status: Abnormal   Collection Time   01/07/12  6:46 AM      Component Value Range Comment   Sodium 136  135 - 145 mEq/L    Potassium 4.9  3.5 - 5.1 mEq/L    Chloride 101  96 - 112 mEq/L    BUN 30 (*) 6 - 23 mg/dL    Creatinine, Ser 5.17  0.50 - 1.10 mg/dL    Glucose, Bld 616 (*) 70 - 99 mg/dL    Calcium, Ion 0.73  7.10 - 1.30 mmol/L    TCO2 27  0 - 100 mmol/L    Hemoglobin 12.6  12.0 - 15.0 g/dL    HCT 62.6  94.8 - 54.6 %   CBC     Status: Abnormal   Collection Time   01/07/12  6:50 AM      Component Value Range Comment   WBC 9.7  4.0 - 10.5 K/uL    RBC 4.42  3.87 - 5.11 MIL/uL    Hemoglobin 11.8 (*) 12.0 - 15.0 g/dL    HCT 27.0  35.0 - 09.3 %    MCV 81.4  78.0 - 100.0 fL    MCH 26.7  26.0 - 34.0 pg    MCHC 32.8  30.0 - 36.0 g/dL    RDW 81.8 (*) 29.9 - 15.5 %    Platelets 168  150 - 400 K/uL   BASIC METABOLIC PANEL     Status: Abnormal   Collection Time  01/07/12  6:50 AM      Component Value Range Comment   Sodium 135  135 - 145 mEq/L    Potassium 4.8  3.5 - 5.1 mEq/L    Chloride 97  96 - 112 mEq/L    CO2 31  19 - 32 mEq/L    Glucose, Bld 231 (*) 70 - 99 mg/dL    BUN 26 (*) 6 - 23 mg/dL    Creatinine, Ser 4.09  0.50 - 1.10 mg/dL    Calcium 9.0  8.4 - 81.1 mg/dL    GFR calc non Af Amer 58 (*) >90 mL/min    GFR calc Af Amer 67 (*) >90 mL/min   D-DIMER,  QUANTITATIVE     Status: Abnormal   Collection Time   01/07/12  6:50 AM      Component Value Range Comment   D-Dimer, Quant 0.85 (*) 0.00 - 0.48 ug/mL-FEU   PRO B NATRIURETIC PEPTIDE     Status: Abnormal   Collection Time   01/07/12  6:50 AM      Component Value Range Comment   Pro B Natriuretic peptide (BNP) 936.8 (*) 0 - 125 pg/mL   GLUCOSE, CAPILLARY     Status: Abnormal   Collection Time   01/07/12 10:17 AM      Component Value Range Comment   Glucose-Capillary 196 (*) 70 - 99 mg/dL   GLUCOSE, CAPILLARY     Status: Abnormal   Collection Time   01/07/12  4:49 PM      Component Value Range Comment   Glucose-Capillary 398 (*) 70 - 99 mg/dL    Comment 1 Notify RN      Comment 2 Documented in Chart     GLUCOSE, CAPILLARY     Status: Abnormal   Collection Time   01/07/12  9:04 PM      Component Value Range Comment   Glucose-Capillary 378 (*) 70 - 99 mg/dL    Comment 1 Notify RN     GLUCOSE, CAPILLARY     Status: Abnormal   Collection Time   01/08/12  7:42 AM      Component Value Range Comment   Glucose-Capillary 218 (*) 70 - 99 mg/dL    Comment 1 Documented in Chart      Comment 2 Notify RN       Diagnostics:  Dg Chest 2 View  01/07/2012  *RADIOLOGY REPORT*  Clinical Data: Left chest pain.  CHEST - 2 VIEW  Comparison: 01/07/2012 and 10/18/2011  Findings: Two views of the chest were obtained.  Again noted are left basilar densities and enlargement of the cardiac silhouette. The upper lungs remain clear.  Evidence for an aortic valve surgery.  Lateral view demonstrates densities at the costophrenic angle and findings suggest pleural fluid.  Bony thorax appears intact.  IMPRESSION: Left basilar densities are suggestive for atelectasis and small pleural effusion.  Original Report Authenticated By: Richarda Overlie, M.D.   Ct Angio Chest Pe W/cm &/or Wo Cm  01/07/2012  *RADIOLOGY REPORT*  Clinical Data: Chest pain  CT ANGIOGRAPHY CHEST  Technique:  Multidetector CT imaging of the chest using the  standard protocol during bolus administration of intravenous contrast. Multiplanar reconstructed images including MIPs were obtained and reviewed to evaluate the vascular anatomy.  Contrast: OMNIPAQUE IOHEXOL 350 MG/ML SOLN  Comparison: 08/17/2011  Findings: No filling defects in the pulmonary arterial tree to suggest acute pulmonary thromboembolism.  Negative abnormal mediastinal adenopathy.  Negative pericardial effusion.  No pneumothorax.  Tiny left pleural effusion.  Nonspecific 6 mm patchy density in the right lower lobe is stable compared with March of this year.  It is not significantly changed compared with 2005.  Dependent atelectasis at the lung bases.  Aortic valve prosthetic component in place.  Sternal wires in place.  There is some bony bridging within the mid sternum.  The remainder of the sternotomy has not healed yet.  T5-6 calcified disc herniation effaces the anterior thecal sac.  IMPRESSION: No evidence of acute pulmonary thromboembolism.  Original Report Authenticated By: Donavan Burnet, M.D.   Chest Portable 1 View  01/07/2012  *RADIOLOGY REPORT*  Clinical Data: Pain under the left breast.  History of aortic valve surgery.  PORTABLE CHEST - 1 VIEW  Comparison: 10/18/2011  Findings: The heart appears enlarged on this single view.  Cannot exclude densities at the left lung base.  There is no evidence for pulmonary edema.  The right lung is clear.  IMPRESSION: Cardiomegaly and left basilar densities.  Left basilar densities could be related to overlying soft tissue but cannot exclude densities or pleural fluid.  This area may be further evaluated with PA and lateral views.  Original Report Authenticated By: Richarda Overlie, M.D.   EKG: Normal sinus rhythm  Possible Left atrial enlargement  Right bundle branch block (old)  Hospital Course: See H&P for complete admission details. The patient is a 71 year old white female with multiple medical problems who presented with pleuritic left chest  pain. She also had dyspnea on exertion. Workup was unremarkable. She had no wheezing cough fevers chills. She thinks she may have pulled a muscle while at cardiac rehabilitation. She admits to dietary indiscretion not recently, but 2 weeks ago while on vacation. She reported an increase of about 8 pounds. Her oxygen saturation dropped to 86% with ambulation in the emergency room. She was given antibiotics in the emergency room, but has no clinical evidence of pneumonia. Also, CT angiogram of the chest shows no new infiltrate. No PE was found. She was placed on observation. Given nonsteroidal anti-inflammatories. IV Lasix. Her pain remains but is improved. Her dyspnea has resolved as has her hypoxia. She had had Pro BNP of 900. She likely had a very mild exacerbation of her chronic systolic heart failure. I will ask her to take a few more days of ibuprofen. Double her Lasix. I've given her a prescription for hydrocodone to take as needed for severe refractory pain. She has an appointment already scheduled with Dr. Diona Browner later this month. She wishes to followup with a new endocrinologist and I've referred her to Dr. Sharl Ma at [facility] end[facility]ology.  Discharge Exam:  Blood pressure 141/81, pulse 87, temperature 98.2 F (36.8 C), temperature source Oral, resp. rate 16, height 5\' 4"  (1.626 m), weight 90.493 kg (199 lb 8 oz), SpO2 97.00%.  General: Comfortable. Talkative. No difficulty ambulating the halls with me. Oxygen saturations remains 95% off oxygen while ambulating. Lungs clear to auscultation bilaterally without wheeze rhonchi or rales Cardiovascular regular rate rhythm without murmurs gallops rubs Abdomen soft nontender nondistended Extremities no clubbing cyanosis or edema  Signed: Kairav Russomanno L 01/08/2012, 9:30 AM

## 2012-01-08 NOTE — Plan of Care (Signed)
Problem: Discharge Progression Outcomes Goal: Other Discharge Outcomes/Goals Pt discharged to home accompanied by spouse. Discharge instructions and rx given and explained.

## 2012-01-10 ENCOUNTER — Encounter (HOSPITAL_COMMUNITY)
Admission: RE | Admit: 2012-01-10 | Discharge: 2012-01-10 | Disposition: A | Discharge status: Home / Self Care | Payer: Medicare Other | Source: Ambulatory Visit | Attending: Cardiology | Admitting: Cardiology

## 2012-01-10 DIAGNOSIS — J4489 Other specified chronic obstructive pulmonary disease: Secondary | ICD-10-CM | POA: Insufficient documentation

## 2012-01-10 DIAGNOSIS — I509 Heart failure, unspecified: Secondary | ICD-10-CM | POA: Insufficient documentation

## 2012-01-10 DIAGNOSIS — E785 Hyperlipidemia, unspecified: Secondary | ICD-10-CM | POA: Insufficient documentation

## 2012-01-10 DIAGNOSIS — E669 Obesity, unspecified: Secondary | ICD-10-CM | POA: Insufficient documentation

## 2012-01-10 DIAGNOSIS — I129 Hypertensive chronic kidney disease with stage 1 through stage 4 chronic kidney disease, or unspecified chronic kidney disease: Secondary | ICD-10-CM | POA: Insufficient documentation

## 2012-01-10 DIAGNOSIS — Z7982 Long term (current) use of aspirin: Secondary | ICD-10-CM | POA: Insufficient documentation

## 2012-01-10 DIAGNOSIS — B1920 Unspecified viral hepatitis C without hepatic coma: Secondary | ICD-10-CM | POA: Insufficient documentation

## 2012-01-10 DIAGNOSIS — I5022 Chronic systolic (congestive) heart failure: Secondary | ICD-10-CM | POA: Insufficient documentation

## 2012-01-10 DIAGNOSIS — Z954 Presence of other heart-valve replacement: Secondary | ICD-10-CM | POA: Insufficient documentation

## 2012-01-10 DIAGNOSIS — N189 Chronic kidney disease, unspecified: Secondary | ICD-10-CM | POA: Insufficient documentation

## 2012-01-10 DIAGNOSIS — J449 Chronic obstructive pulmonary disease, unspecified: Secondary | ICD-10-CM | POA: Insufficient documentation

## 2012-01-10 DIAGNOSIS — K219 Gastro-esophageal reflux disease without esophagitis: Secondary | ICD-10-CM | POA: Insufficient documentation

## 2012-01-10 DIAGNOSIS — M810 Age-related osteoporosis without current pathological fracture: Secondary | ICD-10-CM | POA: Insufficient documentation

## 2012-01-10 DIAGNOSIS — Z5189 Encounter for other specified aftercare: Secondary | ICD-10-CM | POA: Insufficient documentation

## 2012-01-10 DIAGNOSIS — E119 Type 2 diabetes mellitus without complications: Secondary | ICD-10-CM | POA: Insufficient documentation

## 2012-01-10 DIAGNOSIS — Z794 Long term (current) use of insulin: Secondary | ICD-10-CM | POA: Insufficient documentation

## 2012-01-12 ENCOUNTER — Encounter (HOSPITAL_COMMUNITY)
Admission: RE | Admit: 2012-01-12 | Discharge: 2012-01-12 | Disposition: A | Discharge status: Home / Self Care | Payer: Medicare Other | Source: Ambulatory Visit | Attending: Cardiology | Admitting: Cardiology

## 2012-01-14 ENCOUNTER — Encounter (HOSPITAL_COMMUNITY)
Admission: RE | Admit: 2012-01-14 | Discharge: 2012-01-14 | Disposition: A | Discharge status: Home / Self Care | Payer: Medicare Other | Source: Ambulatory Visit | Attending: Cardiology | Admitting: Cardiology

## 2012-01-17 ENCOUNTER — Encounter (HOSPITAL_COMMUNITY)
Admission: RE | Admit: 2012-01-17 | Discharge: 2012-01-17 | Disposition: A | Discharge status: Home / Self Care | Payer: Medicare Other | Source: Ambulatory Visit | Attending: Cardiology | Admitting: Cardiology

## 2012-01-19 ENCOUNTER — Encounter (HOSPITAL_COMMUNITY): Payer: Medicare Other

## 2012-01-21 ENCOUNTER — Encounter (HOSPITAL_COMMUNITY): Payer: Medicare Other

## 2012-01-26 ENCOUNTER — Ambulatory Visit (INDEPENDENT_AMBULATORY_CARE_PROVIDER_SITE_OTHER): Payer: Medicare Other | Admitting: Adult Health

## 2012-01-26 ENCOUNTER — Encounter: Payer: Self-pay | Admitting: Adult Health

## 2012-01-26 VITALS — BP 124/74 | HR 94 | Ht 64.0 in | Wt 200.1 lb

## 2012-01-26 DIAGNOSIS — E1165 Type 2 diabetes mellitus with hyperglycemia: Secondary | ICD-10-CM

## 2012-01-26 DIAGNOSIS — I10 Essential (primary) hypertension: Secondary | ICD-10-CM

## 2012-01-26 DIAGNOSIS — I509 Heart failure, unspecified: Secondary | ICD-10-CM

## 2012-01-26 DIAGNOSIS — I5023 Acute on chronic systolic (congestive) heart failure: Secondary | ICD-10-CM

## 2012-01-26 LAB — BASIC METABOLIC PANEL
BUN: 31 mg/dL — ABNORMAL HIGH (ref 6–23)
CO2: 30 mEq/L (ref 19–32)
Calcium: 9.4 mg/dL (ref 8.4–10.5)
Creat: 1.1 mg/dL (ref 0.50–1.10)
Glucose, Bld: 170 mg/dL — ABNORMAL HIGH (ref 70–99)
Sodium: 136 mEq/L (ref 135–145)

## 2012-01-26 MED ORDER — FUROSEMIDE 40 MG PO TABS: 40.0000 mg | ORAL_TABLET | Freq: Every morning | ORAL | Status: DC

## 2012-01-26 NOTE — Assessment & Plan Note (Signed)
He requests the name of endocrinologist in [facility]. We have provided her phone number for [facility]. She will make that phone call on her own accord.

## 2012-01-26 NOTE — Patient Instructions (Signed)
Your physician recommends that you schedule a follow-up appointment in: 4 months with Dr Diona Browner  Your physician recommends that you return for lab work in: Today  Call Dr Laurena Slimmer office (endocrineologist) for an appointment [phone]

## 2012-01-26 NOTE — Progress Notes (Signed)
HPI: This is Kathryn Morgan is a 71 year old patient of Dr. Simona Huh we are following for ongoing assessment of treatment of valvular heart disease with most recent surgery in March of 2013 for aortic valve replacement with an [facility] Ease pericardial tissue valve by Dr. Barry Dienes. She also has a history of COPD, systolic CHF, and hypertension.   He is recently admitted to [facility] for dyspnea, left-sided chest discomfort, and found to have a small pleural effusion, no evidence of CHF, or PE. She may have had a component of pleurisy, although this was not officially diagnosed.   On discharge, she was placed on higher doses of Lasix for 3 days and 80 mg twice a day. She is then returned back her 40 mg dose twice a day. Other medications were continued as she was taking prior to admission. Is not had any lab work since discharge with medication adjustments. She denies any further discomfort in her chest, increased shortness of breath, or weight gain. He requests the name of an endocrinologist in [place] secondary to diabetes and dissatisfaction with current endocrinologist.  Allergies  Allergen Reactions  . Codeine Itching  . Promethazine Hcl Anxiety and Other (See Comments)    Pt and husband state severe hallucinations and paranoia from phenegran  . Ace Inhibitors Cough  . Adhesive (Tape) Other (See Comments)    Skin tearing  . Levofloxacin Itching and Other (See Comments)    Patient unsure of exact reaction    Current Outpatient Prescriptions  Medication Sig Dispense Refill  . aspirin EC 325 MG tablet Take 325 mg by mouth every morning.       . carvedilol (COREG) 12.5 MG tablet Take 1 tablet (12.5 mg total) by mouth 2 (two) times daily with a meal.  180 tablet  3  . cetirizine (ZYRTEC) 10 MG tablet Take 10 mg by mouth at bedtime.       . docusate sodium (COLACE) 100 MG capsule Take 100 mg by mouth 2 (two) times daily.        . fish oil-omega-3 fatty acids 1000 MG capsule Take 1 g by mouth 2  (two) times daily.       . Fluticasone-Salmeterol (ADVAIR DISKUS) 250-50 MCG/DOSE AEPB Inhale 1 puff into the lungs every 12 (twelve) hours.        . furosemide (LASIX) 20 MG tablet Take 2 tablets (40 mg total) by mouth every morning. Until back to target weight (about one week)  30 tablet    . guaiFENesin (MUCINEX) 600 MG 12 hr tablet Take 1,200 mg by mouth 2 (two) times daily.      . insulin detemir (LEVEMIR) 100 UNIT/ML injection Inject 44 Units into the skin at bedtime.       . Insulin Lispro, Human, (HUMALOG KWIKPEN [place]) Inject 10-16 Units into the skin 3 (three) times daily with meals. Per sliding scale      . Probiotic Product (PROBIOTIC FORMULA PO) Take 1 capsule by mouth every morning.       Marland Kitchen PROTONIX 40 MG tablet TAKE 1 TABLET BY MOUTH TWICE DAILY BEFORE A MEAL.  60 each  5  . simvastatin (ZOCOR) 20 MG tablet Take 20 mg by mouth at bedtime.        . sodium chloride (OCEAN) 0.65 % nasal spray Place 1 spray into the nose daily as needed. For dryness      . spironolactone (ALDACTONE) 25 MG tablet Take 12.5 mg by mouth every morning.      Marland Kitchen  tiotropium (SPIRIVA) 18 MCG inhalation capsule Place 18 mcg into inhaler and inhale daily.          Past Medical History  Diagnosis Date  . Type 2 diabetes mellitus     Insulin pump  . Essential hypertension, benign   . Dyslipidemia   . Depression   . Necrotizing pancreatitis   . Neuropathy   . Rhabdomyolysis   . Gallstone pancreatitis     2005  . Chronic systolic heart failure     LVEF 35%  . Aortic stenosis     Moderate to severe  . Respiratory failure     Home O2 ordered 05/2011  . Right bundle-branch block   . Rheumatic fever   . Obesity (BMI 30-39.9) 08/10/2011  . Osteoporosis 08/10/2011  . GERD (gastroesophageal reflux disease)   . Allergy   . CHF (congestive heart failure)   . Heart murmur     AORTIC STENOSIS  . COPD (chronic obstructive pulmonary disease)   . Shortness of breath     WITH EXERTION, DRY COUGH   . Diabetes  mellitus   . Blood dyscrasia     VIT D DEF  . Hepatitis     2005 OR 06  ? HEP C    . Blood transfusion   . Chronic kidney disease     KIDNEYS WORKING 50 PERCENT   . Arthritis     PRE OP RT LEG  . S/P aortic valve replacement 08/27/2011    23mm [facility] Ease pericardial tissue valve    Past Surgical History  Procedure Date  . Esophagogastroduodenoscopy 04/21/04    Normal esophagus/normal D1,D2; attempted  transgastric drainage/stent placement of pancreatic pseudocyst, no obvious location for gastrotomy, therefore transferred to [facility]  . Debridement pancreas   . Pancreatic pseudocyst drainage   . Cholecystectomy   . Cardiac catheterization     DR MCDOWELL   . Aortic valve replacement 08/27/2011    Procedure: AORTIC VALVE REPLACEMENT (AVR);  Surgeon: Purcell Nails, MD;  Location: [facility] OR;  Service: Open Heart Surgery;  Laterality: N/A;    ION:GEXBMW of systems complete and found to be negative unless listed above  PHYSICAL EXAM BP 124/74  Pulse 94  Ht 5\' 4"  (1.626 m)  Wt 200 lb 1.3 oz (90.756 kg)  BMI 34.34 kg/m2  General: Well developed, well nourished, in no acute distress Head: Eyes PERRLA, No xanthomas.   Normal cephalic and atramatic  Lungs: Clear bilaterally to auscultation and percussion. Heart: HRRR S1 S2, without MRG.  Pulses are 2+ & equal.            No carotid bruit. No JVD.  No abdominal bruits. No femoral bruits. Abdomen: Bowel sounds are positive, abdomen soft and non-tender without masses or                  Hernia's noted. Msk:  Back normal, normal gait. Normal strength and tone for age. Extremities: No clubbing, cyanosis or edema.  DP +1 Neuro: Alert and oriented X 3. Psych:  Good affect, responds appropriately    ASSESSMENT AND PLAN

## 2012-01-26 NOTE — Assessment & Plan Note (Signed)
She is back to baseline weight, and baseline Lasix dosing of 40 mg daily. She continues to wear self daily and is doing her best to avoid salt. She will continue her current medication regimen. I will check a BMET for ongoing assessment of renal function. She will followup with Dr. Diona Browner in 3-4 months. She is to call as for any weight gain, or need to take an additional Lasix. She is given refill on her Lasix.

## 2012-01-26 NOTE — Assessment & Plan Note (Signed)
Excellent control of blood pressure. Continue ACE inhibitor, Coreg, and low sodium diet.

## 2012-01-27 ENCOUNTER — Telehealth: Payer: Self-pay | Admitting: *Deleted

## 2012-01-27 NOTE — Telephone Encounter (Signed)
Message copied by Tarri Fuller on Thu Jan 27, 2012  9:58 AM ------      Message from: MCDOWELL, Illene Bolus      Created: Wed Jan 26, 2012  7:45 PM       Reviewed. Creatinine stable at 1.1, potassium 4.9.

## 2012-01-27 NOTE — Telephone Encounter (Signed)
pt notified of lab results today and gave me verbal understanding. pt asked for results to be mailed to her today, I told pt I will put them in the mail today

## 2012-02-04 ENCOUNTER — Telehealth: Payer: Self-pay | Admitting: Cardiology

## 2012-02-04 NOTE — Telephone Encounter (Signed)
Noted  

## 2012-02-04 NOTE — Telephone Encounter (Signed)
PT HAS TOOK EXTRA FLUID PILLS FOR TWO DAYS SHE WAS UP 5 LBS

## 2012-02-04 NOTE — Telephone Encounter (Signed)
Reviewed recent office note by Samara Deist with recommendation to use additional Lasix when necessary for weight gain. If she has had improvement in her weight with this intervention, would otherwise continue with same course. If she finds that she is having to use additional Lasix frequently, we may need to modify her standing dose.

## 2012-03-31 ENCOUNTER — Other Ambulatory Visit: Payer: Self-pay | Admitting: Family Medicine

## 2012-05-02 ENCOUNTER — Other Ambulatory Visit: Payer: Self-pay

## 2012-05-02 MED ORDER — PANTOPRAZOLE SODIUM 40 MG PO TBEC: 40.0000 mg | DELAYED_RELEASE_TABLET | Freq: Two times a day (BID) | ORAL | Status: DC

## 2012-05-02 NOTE — Telephone Encounter (Signed)
Called pt, she stated that she cannot make an appt right now, she will call us when she can.

## 2012-05-02 NOTE — Telephone Encounter (Signed)
Dawn can please get her an appointment.   Thanks Ginger

## 2012-05-02 NOTE — Telephone Encounter (Signed)
Needs OV.  

## 2012-05-24 ENCOUNTER — Other Ambulatory Visit (HOSPITAL_COMMUNITY): Payer: Self-pay | Admitting: Family Medicine

## 2012-05-24 DIAGNOSIS — Z09 Encounter for follow-up examination after completed treatment for conditions other than malignant neoplasm: Secondary | ICD-10-CM

## 2012-05-25 ENCOUNTER — Ambulatory Visit (INDEPENDENT_AMBULATORY_CARE_PROVIDER_SITE_OTHER): Payer: Medicare Other | Admitting: Cardiology

## 2012-05-25 ENCOUNTER — Encounter: Payer: Self-pay | Admitting: Cardiology

## 2012-05-25 VITALS — BP 132/80 | HR 82 | Ht 63.5 in | Wt 208.4 lb

## 2012-05-25 DIAGNOSIS — E782 Mixed hyperlipidemia: Secondary | ICD-10-CM

## 2012-05-25 DIAGNOSIS — I10 Essential (primary) hypertension: Secondary | ICD-10-CM

## 2012-05-25 DIAGNOSIS — I428 Other cardiomyopathies: Secondary | ICD-10-CM

## 2012-05-25 DIAGNOSIS — Z954 Presence of other heart-valve replacement: Secondary | ICD-10-CM

## 2012-05-25 DIAGNOSIS — I429 Cardiomyopathy, unspecified: Secondary | ICD-10-CM

## 2012-05-25 DIAGNOSIS — Z952 Presence of prosthetic heart valve: Secondary | ICD-10-CM

## 2012-05-25 MED ORDER — LOSARTAN POTASSIUM 25 MG PO TABS: 25.0000 mg | ORAL_TABLET | Freq: Every day | ORAL | Status: DC

## 2012-05-25 MED ORDER — FUROSEMIDE 40 MG PO TABS: 40.0000 mg | ORAL_TABLET | Freq: Every morning | ORAL | Status: DC

## 2012-05-25 NOTE — Assessment & Plan Note (Signed)
Blood pressure trend has been increasing. We discussed diet and exercise. Will also try Cozaar 25 mg daily. Will also be helpful with concurrent cardiomyopathy and diabetes. Followup BMET in 2 weeks.

## 2012-05-25 NOTE — Patient Instructions (Addendum)
Your physician recommends that you schedule a follow-up appointment in: 3 MONTHS WITH SM  Your physician has requested that you have an echocardiogram. Echocardiography is a painless test that uses sound waves to create images of your heart. It provides your doctor with information about the size and shape of your heart and how well your heart's chambers and valves are working. This procedure takes approximately one hour. There are no restrictions for this procedure.  Your physician has recommended you make the following change in your medication:   1) START COZAAR 25MG  DAILY  Your physician recommends that you return for lab work in: 2 WEEKS (BMET) SLIPS GIVEN

## 2012-05-25 NOTE — Assessment & Plan Note (Signed)
LVEF previously documented at 35%. No recent heart failure symptoms although did have exacerbation earlier in the year. Continue medical therapy, addition of ARB, uses standing Lasix with p.r.n. dosing occasionally. Focus on better blood pressure control, also increasing exercise. Will reassess LVEF by pending echocardiogram.

## 2012-05-25 NOTE — Assessment & Plan Note (Signed)
LDL control has been good.

## 2012-05-25 NOTE — Addendum Note (Signed)
Addended by: Derry Lory A on: 05/25/2012 01:35 PM   Modules accepted: Orders

## 2012-05-25 NOTE — Assessment & Plan Note (Signed)
Bioprosthetic aortic valve replacement by Dr. Cornelius Moras back in March. Symptomatically, patient is doing quite well. Followup echocardiogram will be obtained for postoperative baseline.

## 2012-05-25 NOTE — Progress Notes (Signed)
Clinical Summary [name] is a 71 y.o.female presenting for followup. I last saw her in February. More recent visit was with [name] NP back in August.  She states that she has been doing well in general. Is under stress caring for ailing family members, has not had time for regular exercise or careful diet. Admits that her weight is up due to this. She has been compliant with her medications. Brings in home blood pressure checks showing systolics generally in the 140s to 150s. She has not yet had a followup echocardiogram status post aortic valve replacement.  Lab work from September reviewed finding cholesterol 150, HDL 54, cholesterol is 101, LDL 76, hemoglobin A1c 9.3. Lab work in August revealed potassium 4.8, BUN 26, creatinine 0.9.  No recent hospitalizations.  Allergies  Allergen Reactions  . Codeine Itching  . Promethazine Hcl Anxiety and Other (See Comments)    Pt and husband state severe hallucinations and paranoia from phenegran  . Ace Inhibitors Cough  . Adhesive (Tape) Other (See Comments)    Skin tearing  . Levofloxacin Itching and Other (See Comments)    Patient unsure of exact reaction    Current Outpatient Prescriptions  Medication Sig Dispense Refill  . aspirin EC 325 MG tablet Take 325 mg by mouth every morning.       . Biotin (BIOTIN 5000) 5 MG CAPS Take 5,000 mg by mouth daily.      . carvedilol (COREG) 12.5 MG tablet Take 1 tablet (12.5 mg total) by mouth 2 (two) times daily with a meal.  180 tablet  3  . cetirizine (ZYRTEC) 10 MG tablet Take 10 mg by mouth at bedtime.       . docusate sodium (COLACE) 100 MG capsule Take 100 mg by mouth 2 (two) times daily.        . fish oil-omega-3 fatty acids 1000 MG capsule Take 1 g by mouth 2 (two) times daily.       . Fluticasone-Salmeterol (ADVAIR DISKUS) 250-50 MCG/DOSE AEPB Inhale 1 puff into the lungs every 12 (twelve) hours.        . furosemide (LASIX) 40 MG tablet Take 1 tablet (40 mg total) by mouth every  morning.  45 tablet  11  . guaiFENesin (MUCINEX) 600 MG 12 hr tablet Take 1,200 mg by mouth 2 (two) times daily as needed.       . insulin detemir (LEVEMIR) 100 UNIT/ML injection Inject 54 Units into the skin at bedtime.       . Insulin Lispro, Human, (HUMALOG KWIKPEN [place]) Inject 10-16 Units into the skin 3 (three) times daily with meals. Per sliding scale      . Probiotic Product (PROBIOTIC FORMULA PO) Take 1 capsule by mouth every morning.       . simvastatin (ZOCOR) 20 MG tablet Take 20 mg by mouth at bedtime.        . sodium chloride (OCEAN) 0.65 % nasal spray Place 1 spray into the nose daily as needed. For dryness      . spironolactone (ALDACTONE) 25 MG tablet Take 12.5 mg by mouth every morning.      . tiotropium (SPIRIVA) 18 MCG inhalation capsule Place 18 mcg into inhaler and inhale daily.        Marland Kitchen losartan (COZAAR) 25 MG tablet Take 1 tablet (25 mg total) by mouth daily.  90 tablet  1    Past Medical History  Diagnosis Date  . Type 2 diabetes mellitus  Insulin pump  . Essential hypertension, benign   . Dyslipidemia   . Depression   . Necrotizing pancreatitis   . Neuropathy   . Rhabdomyolysis   . Gallstone pancreatitis     2005  . Chronic systolic heart failure     LVEF 35%  . Aortic stenosis     Moderate to severe  . Right bundle-branch block   . Rheumatic fever   . Osteoporosis   . GERD (gastroesophageal reflux disease)   . COPD (chronic obstructive pulmonary disease)   . Hepatitis   . Chronic kidney disease   . S/P aortic valve replacement 08/27/2011    23mm [facility] Ease pericardial tissue valve    Past Surgical History  Procedure Date  . Esophagogastroduodenoscopy 04/21/04    Normal esophagus/normal D1,D2; attempted  transgastric drainage/stent placement of pancreatic pseudocyst, no obvious location for gastrotomy, therefore transferred to [facility]  . Debridement pancreas   . Pancreatic pseudocyst drainage   . Cholecystectomy   . Aortic valve  replacement 08/27/2011    Procedure: AORTIC VALVE REPLACEMENT (AVR);  Surgeon: Purcell Nails, MD;  Location: [facility] OR;  Service: Open Heart Surgery;  Laterality: N/A;    Social History [name] reports that she quit smoking about 30 years ago. Her smoking use included Cigarettes. She has a 45 pack-year smoking history. She does not have any smokeless tobacco history on file. [name] reports that she does not drink alcohol.  Review of Systems No palpitations or syncope. No reported bleeding problems. No orthopnea or PND. Otherwise negative.  Physical Examination Filed Vitals:   05/25/12 0849  BP: 132/80  Pulse: 82   Filed Weights   05/25/12 0849  Weight: 208 lb 6.4 oz (94.53 kg)   No acute distress. HEENT: Conjunctiva and lids normal, oropharynx clear with moist mucosa. Neck: Supple, no elevated JVP, no thyromegaly. Lungs: Clear to auscultation, nonlabored breathing at rest. Cardiac: Regular rate and rhythm, no S3, soft systolic murmur, no pericardial rub. Abdomen: Soft, nontender, bowel sounds present, no guarding or rebound. Extremities: No pitting edema, distal pulses 2+. Skin: Warm and dry. Musculoskeletal: No kyphosis. Neuropsychiatric: Alert and oriented x3, affect grossly appropriate.   Problem List and Plan   S/P aortic valve replacement Bioprosthetic aortic valve replacement by Dr. Cornelius Moras back in March. Symptomatically, patient is doing quite well. Followup echocardiogram will be obtained for postoperative baseline.  Secondary cardiomyopathy, unspecified LVEF previously documented at 35%. No recent heart failure symptoms although did have exacerbation earlier in the year. Continue medical therapy, addition of ARB, uses standing Lasix with p.r.n. dosing occasionally. Focus on better blood pressure control, also increasing exercise. Will reassess LVEF by pending echocardiogram.  Essential hypertension, benign Blood pressure trend has been increasing. We discussed  diet and exercise. Will also try Cozaar 25 mg daily. Will also be helpful with concurrent cardiomyopathy and diabetes. Followup BMET in 2 weeks.  Mixed hyperlipidemia LDL control has been good.    Jonelle Sidle, M.D., F.A.C.C.

## 2012-05-26 ENCOUNTER — Ambulatory Visit (HOSPITAL_COMMUNITY)
Admission: RE | Admit: 2012-05-26 | Discharge: 2012-05-26 | Disposition: A | Discharge status: Home / Self Care | Payer: Medicare Other | Source: Ambulatory Visit | Attending: Cardiology | Admitting: Cardiology

## 2012-05-26 DIAGNOSIS — I517 Cardiomegaly: Secondary | ICD-10-CM

## 2012-05-26 DIAGNOSIS — I2589 Other forms of chronic ischemic heart disease: Secondary | ICD-10-CM | POA: Insufficient documentation

## 2012-05-26 DIAGNOSIS — E119 Type 2 diabetes mellitus without complications: Secondary | ICD-10-CM | POA: Insufficient documentation

## 2012-05-26 DIAGNOSIS — J4489 Other specified chronic obstructive pulmonary disease: Secondary | ICD-10-CM | POA: Insufficient documentation

## 2012-05-26 DIAGNOSIS — J449 Chronic obstructive pulmonary disease, unspecified: Secondary | ICD-10-CM | POA: Insufficient documentation

## 2012-05-26 DIAGNOSIS — I429 Cardiomyopathy, unspecified: Secondary | ICD-10-CM

## 2012-05-26 DIAGNOSIS — I10 Essential (primary) hypertension: Secondary | ICD-10-CM | POA: Insufficient documentation

## 2012-05-26 NOTE — Progress Notes (Signed)
*  PRELIMINARY RESULTS* Echocardiogram 2D Echocardiogram has been performed.  Kathryn Morgan 05/26/2012, 2:23 PM 

## 2012-05-29 ENCOUNTER — Other Ambulatory Visit: Payer: Self-pay | Admitting: *Deleted

## 2012-06-01 ENCOUNTER — Telehealth: Payer: Self-pay | Admitting: *Deleted

## 2012-06-01 DIAGNOSIS — I10 Essential (primary) hypertension: Secondary | ICD-10-CM

## 2012-06-01 MED ORDER — LOSARTAN POTASSIUM 25 MG PO TABS: 25.0000 mg | ORAL_TABLET | Freq: Every day | ORAL | Status: DC

## 2012-06-01 MED ORDER — FUROSEMIDE 40 MG PO TABS: 40.0000 mg | ORAL_TABLET | Freq: Every morning | ORAL | Status: DC

## 2012-06-01 NOTE — Telephone Encounter (Signed)
Pt called to advise that her mail order company did not send out her RX sent for her at this time, as well as refused to overnight them, pt stated " I told the mail order company to cancel my prescriptions so i can get them sent locally, and they said they would cancel them" Pt then requested [facility] and [facility] be removed from her pharmacy list and only [facility] and [facility] be left in her chart, request completed, only 2 pharmacies requested in pt chart, sent Lasix and [place] to [facility] per request, pt aware and advised to allow pharmacy time to receive and fill per busy holiday schedule, pt advised that she will wait til after she starts taking her medications for 2 weeks to get her blood drawn as advised by MD SM on last OV, noted the lab slips given will be expired by the time she goes to the lab, advised pt that lab slips will be faxed for the pt, reprinted new lab forms and faxed to [facility] with directions that pt will come in the next 2 weeks to have drawn and if slips brought in by pt to disregaurd and use new slips received via fax today, as well as notation to call our office with any further questions needed

## 2012-06-14 ENCOUNTER — Ambulatory Visit (HOSPITAL_COMMUNITY)
Admission: RE | Admit: 2012-06-14 | Discharge: 2012-06-14 | Disposition: A | Discharge status: Home / Self Care | Payer: Medicare Other | Source: Ambulatory Visit | Attending: Family Medicine | Admitting: Family Medicine

## 2012-06-14 ENCOUNTER — Other Ambulatory Visit (HOSPITAL_COMMUNITY): Payer: Self-pay | Admitting: Family Medicine

## 2012-06-14 ENCOUNTER — Encounter (HOSPITAL_COMMUNITY): Payer: Self-pay

## 2012-06-14 DIAGNOSIS — N649 Disorder of breast, unspecified: Secondary | ICD-10-CM | POA: Insufficient documentation

## 2012-06-14 DIAGNOSIS — Z09 Encounter for follow-up examination after completed treatment for conditions other than malignant neoplasm: Secondary | ICD-10-CM | POA: Insufficient documentation

## 2012-06-17 LAB — BASIC METABOLIC PANEL
CO2: 35 mEq/L — ABNORMAL HIGH (ref 19–32)
Calcium: 9.8 mg/dL (ref 8.4–10.5)
Creat: 0.91 mg/dL (ref 0.50–1.10)
Sodium: 143 mEq/L (ref 135–145)

## 2012-06-19 ENCOUNTER — Encounter: Payer: Self-pay | Admitting: *Deleted

## 2012-07-11 ENCOUNTER — Other Ambulatory Visit (HOSPITAL_COMMUNITY): Payer: Self-pay | Admitting: Family Medicine

## 2012-07-11 DIAGNOSIS — Z09 Encounter for follow-up examination after completed treatment for conditions other than malignant neoplasm: Secondary | ICD-10-CM

## 2012-07-25 ENCOUNTER — Telehealth: Payer: Self-pay | Admitting: Cardiology

## 2012-07-25 NOTE — Telephone Encounter (Signed)
THEY ARE CALLING TO MAKE SURE WE RECEIVED FAXED FOR A HOME MONITORING DEVICE TO GET FOR THE PATIENT FOR CHF.  THEY ARE RE-FAXING IT.

## 2012-07-25 NOTE — Telephone Encounter (Signed)
.  left message to have patient return my call to advise pt that we did receive the form however MD SM will not be back in the office until next week to review this, the form was placed in his folder for review upon return to the office

## 2012-07-26 NOTE — Telephone Encounter (Signed)
Returned pt call to advise up date on forms received, pt understood Called [facility] to advise receipt of fax, pending MD review at this time,spoke to rep Lanora Manis, who will advise company rep that called the fax was received and pending MD SM review

## 2012-07-31 ENCOUNTER — Emergency Department (HOSPITAL_COMMUNITY): Payer: Medicare Other

## 2012-07-31 ENCOUNTER — Encounter (HOSPITAL_COMMUNITY): Payer: Self-pay | Admitting: Emergency Medicine

## 2012-07-31 ENCOUNTER — Inpatient Hospital Stay (HOSPITAL_COMMUNITY)
Admission: EM | Admit: 2012-07-31 | Discharge: 2012-08-01 | DRG: 194 | Disposition: A | Discharge status: Home / Self Care | Payer: Medicare Other | Attending: Internal Medicine | Admitting: Internal Medicine

## 2012-07-31 DIAGNOSIS — N189 Chronic kidney disease, unspecified: Secondary | ICD-10-CM | POA: Diagnosis present

## 2012-07-31 DIAGNOSIS — K759 Inflammatory liver disease, unspecified: Secondary | ICD-10-CM | POA: Diagnosis present

## 2012-07-31 DIAGNOSIS — I5022 Chronic systolic (congestive) heart failure: Secondary | ICD-10-CM | POA: Diagnosis present

## 2012-07-31 DIAGNOSIS — Z87891 Personal history of nicotine dependence: Secondary | ICD-10-CM

## 2012-07-31 DIAGNOSIS — R091 Pleurisy: Secondary | ICD-10-CM

## 2012-07-31 DIAGNOSIS — R9431 Abnormal electrocardiogram [ECG] [EKG]: Secondary | ICD-10-CM

## 2012-07-31 DIAGNOSIS — R0600 Dyspnea, unspecified: Secondary | ICD-10-CM

## 2012-07-31 DIAGNOSIS — Z6836 Body mass index (BMI) 36.0-36.9, adult: Secondary | ICD-10-CM

## 2012-07-31 DIAGNOSIS — D72829 Elevated white blood cell count, unspecified: Secondary | ICD-10-CM

## 2012-07-31 DIAGNOSIS — R0789 Other chest pain: Secondary | ICD-10-CM

## 2012-07-31 DIAGNOSIS — Z794 Long term (current) use of insulin: Secondary | ICD-10-CM

## 2012-07-31 DIAGNOSIS — I4510 Unspecified right bundle-branch block: Secondary | ICD-10-CM

## 2012-07-31 DIAGNOSIS — R0902 Hypoxemia: Secondary | ICD-10-CM | POA: Diagnosis present

## 2012-07-31 DIAGNOSIS — Z7982 Long term (current) use of aspirin: Secondary | ICD-10-CM

## 2012-07-31 DIAGNOSIS — I10 Essential (primary) hypertension: Secondary | ICD-10-CM

## 2012-07-31 DIAGNOSIS — K219 Gastro-esophageal reflux disease without esophagitis: Secondary | ICD-10-CM | POA: Diagnosis present

## 2012-07-31 DIAGNOSIS — J189 Pneumonia, unspecified organism: Principal | ICD-10-CM

## 2012-07-31 DIAGNOSIS — I429 Cardiomyopathy, unspecified: Secondary | ICD-10-CM

## 2012-07-31 DIAGNOSIS — I350 Nonrheumatic aortic (valve) stenosis: Secondary | ICD-10-CM

## 2012-07-31 DIAGNOSIS — Z79899 Other long term (current) drug therapy: Secondary | ICD-10-CM

## 2012-07-31 DIAGNOSIS — Z952 Presence of prosthetic heart valve: Secondary | ICD-10-CM

## 2012-07-31 DIAGNOSIS — E669 Obesity, unspecified: Secondary | ICD-10-CM

## 2012-07-31 DIAGNOSIS — M810 Age-related osteoporosis without current pathological fracture: Secondary | ICD-10-CM | POA: Diagnosis present

## 2012-07-31 DIAGNOSIS — F3289 Other specified depressive episodes: Secondary | ICD-10-CM | POA: Diagnosis present

## 2012-07-31 DIAGNOSIS — J4489 Other specified chronic obstructive pulmonary disease: Secondary | ICD-10-CM | POA: Diagnosis present

## 2012-07-31 DIAGNOSIS — E782 Mixed hyperlipidemia: Secondary | ICD-10-CM

## 2012-07-31 DIAGNOSIS — J0100 Acute maxillary sinusitis, unspecified: Secondary | ICD-10-CM

## 2012-07-31 DIAGNOSIS — I129 Hypertensive chronic kidney disease with stage 1 through stage 4 chronic kidney disease, or unspecified chronic kidney disease: Secondary | ICD-10-CM | POA: Diagnosis present

## 2012-07-31 DIAGNOSIS — N390 Urinary tract infection, site not specified: Secondary | ICD-10-CM

## 2012-07-31 DIAGNOSIS — F329 Major depressive disorder, single episode, unspecified: Secondary | ICD-10-CM | POA: Diagnosis present

## 2012-07-31 DIAGNOSIS — I509 Heart failure, unspecified: Secondary | ICD-10-CM | POA: Diagnosis present

## 2012-07-31 DIAGNOSIS — J449 Chronic obstructive pulmonary disease, unspecified: Secondary | ICD-10-CM

## 2012-07-31 DIAGNOSIS — I359 Nonrheumatic aortic valve disorder, unspecified: Secondary | ICD-10-CM | POA: Diagnosis present

## 2012-07-31 DIAGNOSIS — E119 Type 2 diabetes mellitus without complications: Secondary | ICD-10-CM

## 2012-07-31 LAB — GLUCOSE, CAPILLARY
Glucose-Capillary: 409 mg/dL — ABNORMAL HIGH (ref 70–99)
Glucose-Capillary: 401 mg/dL — ABNORMAL HIGH (ref 70–99)

## 2012-07-31 LAB — BASIC METABOLIC PANEL
BUN: 32 mg/dL — ABNORMAL HIGH (ref 6–23)
GFR calc Af Amer: 58 mL/min — ABNORMAL LOW (ref >90)
GFR calc non Af Amer: 50 mL/min — ABNORMAL LOW (ref >90)
Potassium: 4.2 mEq/L (ref 3.5–5.1)
Sodium: 136 mEq/L (ref 135–145)

## 2012-07-31 LAB — PRO B NATRIURETIC PEPTIDE: Pro B Natriuretic peptide (BNP): 1237 pg/mL — ABNORMAL HIGH (ref 0–125)

## 2012-07-31 LAB — URINALYSIS, ROUTINE W REFLEX MICROSCOPIC
Hgb urine dipstick: NEGATIVE
Nitrite: NEGATIVE
Specific Gravity, Urine: 1.02 (ref 1.005–1.030)
Urobilinogen, UA: 0.2 mg/dL (ref 0.0–1.0)

## 2012-07-31 LAB — CBC WITH DIFFERENTIAL/PLATELET
Basophils Relative: 0 % (ref 0–1)
Hemoglobin: 12.8 g/dL (ref 12.0–15.0)
MCHC: 33.1 g/dL (ref 30.0–36.0)
Monocytes Relative: 7 % (ref 3–12)
Neutro Abs: 21.4 10*3/uL — ABNORMAL HIGH (ref 1.7–7.7)
Neutrophils Relative %: 88 % — ABNORMAL HIGH (ref 43–77)
RBC: 4.61 MIL/uL (ref 3.87–5.11)

## 2012-07-31 LAB — URINE MICROSCOPIC-ADD ON

## 2012-07-31 LAB — TROPONIN I: Troponin I: <0.3 ng/mL (ref <0.30)

## 2012-07-31 MED ORDER — SALINE SPRAY 0.65 % NA SOLN
2.0000 | Freq: Two times a day (BID) | NASAL | Status: DC
  Administered 2012-07-31: 2 via NASAL
  Filled 2012-07-31: qty 44

## 2012-07-31 MED ORDER — INSULIN ASPART 100 UNIT/ML ~~LOC~~ SOLN: 15.0000 [IU] | Freq: Three times a day (TID) | SUBCUTANEOUS | Status: DC

## 2012-07-31 MED ORDER — IOHEXOL 350 MG/ML SOLN
100.0000 mL | Freq: Once | INTRAVENOUS | Status: AC | PRN
  Administered 2012-07-31: 100 mL via INTRAVENOUS

## 2012-07-31 MED ORDER — ALBUTEROL SULFATE HFA 108 (90 BASE) MCG/ACT IN AERS
2.0000 | INHALATION_SPRAY | Freq: Four times a day (QID) | RESPIRATORY_TRACT | Status: DC
  Administered 2012-07-31 (×2): 2 via RESPIRATORY_TRACT
  Filled 2012-07-31: qty 6.7

## 2012-07-31 MED ORDER — INSULIN DETEMIR 100 UNIT/ML ~~LOC~~ SOLN
54.0000 [IU] | Freq: Every day | SUBCUTANEOUS | Status: DC
  Administered 2012-07-31: 54 [IU] via SUBCUTANEOUS
  Filled 2012-07-31: qty 10

## 2012-07-31 MED ORDER — ONDANSETRON HCL 4 MG/2ML IJ SOLN: 4.0000 mg | Freq: Four times a day (QID) | INTRAMUSCULAR | Status: DC | PRN

## 2012-07-31 MED ORDER — INSULIN ASPART 100 UNIT/ML ~~LOC~~ SOLN: 0.0000 [IU] | Freq: Every day | SUBCUTANEOUS | Status: DC

## 2012-07-31 MED ORDER — TIOTROPIUM BROMIDE MONOHYDRATE 18 MCG IN CAPS
18.0000 ug | ORAL_CAPSULE | Freq: Every day | RESPIRATORY_TRACT | Status: DC
  Administered 2012-08-01: 18 ug via RESPIRATORY_TRACT
  Filled 2012-07-31 (×2): qty 5

## 2012-07-31 MED ORDER — FLUTICASONE PROPIONATE 50 MCG/ACT NA SUSP
2.0000 | Freq: Every day | NASAL | Status: DC
  Filled 2012-07-31: qty 16

## 2012-07-31 MED ORDER — KETOROLAC TROMETHAMINE 30 MG/ML IJ SOLN
30.0000 mg | Freq: Once | INTRAMUSCULAR | Status: AC
  Administered 2012-07-31: 30 mg via INTRAVENOUS
  Filled 2012-07-31: qty 1

## 2012-07-31 MED ORDER — MOMETASONE FURO-FORMOTEROL FUM 100-5 MCG/ACT IN AERO
2.0000 | INHALATION_SPRAY | Freq: Two times a day (BID) | RESPIRATORY_TRACT | Status: DC
  Filled 2012-07-31: qty 8.8

## 2012-07-31 MED ORDER — TRAZODONE HCL 50 MG PO TABS: 50.0000 mg | ORAL_TABLET | Freq: Every evening | ORAL | Status: DC | PRN

## 2012-07-31 MED ORDER — OMEGA-3 FATTY ACIDS 1000 MG PO CAPS: 1.0000 g | ORAL_CAPSULE | Freq: Two times a day (BID) | ORAL | Status: DC

## 2012-07-31 MED ORDER — INSULIN LISPRO 100 UNIT/ML ~~LOC~~ SOLN: 2.0000 [IU] | Freq: Every day | SUBCUTANEOUS | Status: DC

## 2012-07-31 MED ORDER — SALINE NASAL SPRAY 0.65 % NA SOLN
2.0000 | Freq: Two times a day (BID) | NASAL | Status: DC
  Filled 2012-07-31 (×18): qty 0.9

## 2012-07-31 MED ORDER — OXYCODONE HCL 5 MG PO TABS
5.0000 mg | ORAL_TABLET | ORAL | Status: DC | PRN
  Administered 2012-07-31: 5 mg via ORAL
  Filled 2012-07-31: qty 1

## 2012-07-31 MED ORDER — IPRATROPIUM BROMIDE 0.02 % IN SOLN
0.5000 mg | Freq: Once | RESPIRATORY_TRACT | Status: AC
  Administered 2012-07-31: 0.5 mg via RESPIRATORY_TRACT
  Filled 2012-07-31: qty 2.5

## 2012-07-31 MED ORDER — LORATADINE 10 MG PO TABS: 10.0000 mg | ORAL_TABLET | Freq: Every day | ORAL | Status: DC

## 2012-07-31 MED ORDER — CARVEDILOL 12.5 MG PO TABS: 12.5000 mg | ORAL_TABLET | Freq: Two times a day (BID) | ORAL | Status: DC

## 2012-07-31 MED ORDER — FUROSEMIDE 40 MG PO TABS: 40.0000 mg | ORAL_TABLET | Freq: Every morning | ORAL | Status: DC

## 2012-07-31 MED ORDER — SPIRONOLACTONE 25 MG PO TABS: 12.5000 mg | ORAL_TABLET | Freq: Every morning | ORAL | Status: DC

## 2012-07-31 MED ORDER — ALBUTEROL SULFATE (5 MG/ML) 0.5% IN NEBU
5.0000 mg | INHALATION_SOLUTION | Freq: Once | RESPIRATORY_TRACT | Status: AC
  Administered 2012-07-31: 5 mg via RESPIRATORY_TRACT
  Filled 2012-07-31: qty 1

## 2012-07-31 MED ORDER — IBUPROFEN 800 MG PO TABS
400.0000 mg | ORAL_TABLET | Freq: Three times a day (TID) | ORAL | Status: DC
  Administered 2012-07-31 – 2012-08-01 (×2): 400 mg via ORAL
  Filled 2012-07-31 (×2): qty 1

## 2012-07-31 MED ORDER — DEXTROSE 5 % IV SOLN
1.0000 g | Freq: Once | INTRAVENOUS | Status: AC
  Administered 2012-07-31: 1 g via INTRAVENOUS

## 2012-07-31 MED ORDER — ALUM & MAG HYDROXIDE-SIMETH 200-200-20 MG/5ML PO SUSP: 30.0000 mL | Freq: Four times a day (QID) | ORAL | Status: DC | PRN

## 2012-07-31 MED ORDER — DOXYCYCLINE HYCLATE 100 MG PO TABS
100.0000 mg | ORAL_TABLET | Freq: Two times a day (BID) | ORAL | Status: DC
  Administered 2012-07-31: 100 mg via ORAL
  Filled 2012-07-31: qty 1

## 2012-07-31 MED ORDER — SODIUM CHLORIDE 0.9 % IJ SOLN: 3.0000 mL | Freq: Two times a day (BID) | INTRAMUSCULAR | Status: DC

## 2012-07-31 MED ORDER — DEXTROSE 5 % IV SOLN
1.0000 g | INTRAVENOUS | Status: DC
  Filled 2012-07-31: qty 10

## 2012-07-31 MED ORDER — MAGNESIUM HYDROXIDE 400 MG/5ML PO SUSP: 30.0000 mL | Freq: Every day | ORAL | Status: DC | PRN

## 2012-07-31 MED ORDER — INSULIN LISPRO 100 UNIT/ML ~~LOC~~ SOLN
20.0000 [IU] | Freq: Once | SUBCUTANEOUS | Status: AC
  Administered 2012-07-31: 20 [IU] via SUBCUTANEOUS

## 2012-07-31 MED ORDER — DOCUSATE SODIUM 100 MG PO CAPS
100.0000 mg | ORAL_CAPSULE | Freq: Two times a day (BID) | ORAL | Status: DC
  Administered 2012-07-31: 100 mg via ORAL

## 2012-07-31 MED ORDER — HEPARIN SODIUM (PORCINE) 5000 UNIT/ML IJ SOLN
5000.0000 [IU] | Freq: Three times a day (TID) | INTRAMUSCULAR | Status: DC
  Administered 2012-07-31 – 2012-08-01 (×2): 5000 [IU] via SUBCUTANEOUS
  Filled 2012-07-31 (×2): qty 1

## 2012-07-31 MED ORDER — ASPIRIN EC 325 MG PO TBEC: 325.0000 mg | DELAYED_RELEASE_TABLET | Freq: Every morning | ORAL | Status: DC

## 2012-07-31 MED ORDER — INSULIN LISPRO 100 UNIT/ML ~~LOC~~ SOLN: 15.0000 [IU] | Freq: Three times a day (TID) | SUBCUTANEOUS | Status: DC

## 2012-07-31 MED ORDER — SIMVASTATIN 20 MG PO TABS
20.0000 mg | ORAL_TABLET | Freq: Every day | ORAL | Status: DC
  Administered 2012-07-31: 20 mg via ORAL

## 2012-07-31 MED ORDER — DOXYCYCLINE HYCLATE 100 MG IV SOLR
100.0000 mg | Freq: Two times a day (BID) | INTRAVENOUS | Status: DC
  Administered 2012-07-31: 100 mg via INTRAVENOUS
  Filled 2012-07-31 (×3): qty 100

## 2012-07-31 MED ORDER — ACETAMINOPHEN 325 MG PO TABS: 650.0000 mg | ORAL_TABLET | Freq: Four times a day (QID) | ORAL | Status: DC | PRN

## 2012-07-31 MED ORDER — ONDANSETRON HCL 4 MG PO TABS: 4.0000 mg | ORAL_TABLET | Freq: Four times a day (QID) | ORAL | Status: DC | PRN

## 2012-07-31 MED ORDER — LOSARTAN POTASSIUM 50 MG PO TABS: 25.0000 mg | ORAL_TABLET | Freq: Every day | ORAL | Status: DC

## 2012-07-31 MED ORDER — ACETAMINOPHEN 650 MG RE SUPP: 650.0000 mg | Freq: Four times a day (QID) | RECTAL | Status: DC | PRN

## 2012-07-31 MED ORDER — OMEGA-3-ACID ETHYL ESTERS 1 G PO CAPS
1.0000 g | ORAL_CAPSULE | Freq: Two times a day (BID) | ORAL | Status: DC
  Administered 2012-07-31: 1 g via ORAL

## 2012-07-31 MED ORDER — NON FORMULARY: 20.0000 [IU] | Freq: Once | Status: DC

## 2012-07-31 NOTE — ED Notes (Signed)
Pt states has had increased SOB with left side pain with deep breathing. Pt states feels like a pulled muscle, but she has COPD and has had fluid build up before.

## 2012-07-31 NOTE — ED Notes (Signed)
Floor unable to take report at this time. Pt resting well. nad

## 2012-07-31 NOTE — Progress Notes (Addendum)
07/31/12 2012 Order in place for patient to use home medications during admission per Dr Lendell Caprice. Pt states prefers to use her home medications d/t concerns that insurance may not cover them at home and during hospital stay. Home medications brought per patient were in medication box with open pills.  Notified pharmacy this afternoon pt to use home medications. Stated would need medication bottles brought in to verify correct medications and dosages, medications to be sent to pharmacy and dosed as ordered. Discussed with patient, stated would call husband to bring bottles, understood rationale for needing them. Pt administered humalog insulin from home on arrival to floor per T. Corine Shelter, RN. Patient reminded not to use medications from home until RN and pharmacy verified even with order to use home medications. Reinforced this with patient again this afternoon, stated understood. Pt preferred to use humalog pen for correction scale, notified Dr Lendell Caprice, nursing to clarify scale. Clarified scale with patient and notified Dr Lendell Caprice. Orders placed per Dr Lendell Caprice for novolog per her home correction scale. CBG: 409 this evening. Paged Dr Lendell Caprice to notify of CBG and change to humalog nonformulary per pharmacy request. Paged Dr Orvan Falconer this evening since took place around shift change.  Notified of elevated CBG and order to follow home correction scale, but call MD for CBG >400. Order received for humalog 20 units SQ x 1 per patient home medication, stated unable to order at that time, RN or pharmacy to place order. Notified Sue Lush, with pharmacy of order received, stated would verify and then RN could administer as ordered. Notified patient of order received and awaiting pharmacy verification so RN could administer. Pt and husband expressed concerns about having to wait for medication, husband stated "do you want me to take you home?" "I'll just go home and get a pen and bring it for her". Patient stated "no i want  my antibiotics". Reinforced hospital policy for pharmacy to verify medication orders for patient safety. Pt voiced concerns about dinner tray being cold, had earlier stated would wait until later this evening to eat. Reminded patient she would receive dinner. Patient and husband stated understood. Earnstine Regal, RN

## 2012-07-31 NOTE — ED Provider Notes (Signed)
History     CSN: [id number]  Arrival date & time 07/31/12  0819   First MD Initiated Contact with Patient 07/31/12 0827      Chief Complaint  Patient presents with  . Shortness of Breath  . Chest Pain     HPI Pt was seen at 0840.   Per pt, c/o gradual onset and persistence of constant left sided chest "pain" for the past 2 days. Pain worsens with deep breathing and cough.  Has been associated with cough, SOB and "wheezing."  Endorses subjective home fevers/chills, as well as generalized weakness/fatigue.  Denies palpitations, no back pain, no abd pain, no N/V/D, no rash.     Past Medical History  Diagnosis Date  . Type 2 diabetes mellitus     Insulin pump  . Essential hypertension, benign   . Dyslipidemia   . Depression   . Necrotizing pancreatitis   . Neuropathy   . Rhabdomyolysis   . Gallstone pancreatitis     2005  . Chronic systolic heart failure     LVEF 35%  . Aortic stenosis     Moderate to severe  . Right bundle-branch block   . Rheumatic fever   . Osteoporosis   . GERD (gastroesophageal reflux disease)   . COPD (chronic obstructive pulmonary disease)   . Hepatitis   . Chronic kidney disease   . S/P aortic valve replacement 08/27/2011    23mm [facility] Ease pericardial tissue valve    Past Surgical History  Procedure Laterality Date  . Esophagogastroduodenoscopy  04/21/04    Normal esophagus/normal D1,D2; attempted  transgastric drainage/stent placement of pancreatic pseudocyst, no obvious location for gastrotomy, therefore transferred to [facility]  . Debridement pancreas    . Pancreatic pseudocyst drainage    . Cholecystectomy    . Aortic valve replacement  08/27/2011    Procedure: AORTIC VALVE REPLACEMENT (AVR);  Surgeon: Purcell Nails, MD;  Location: [facility] OR;  Service: Open Heart Surgery;  Laterality: N/A;    Family History  Problem Relation Age of Onset  . Colon cancer Neg Hx   . Anesthesia problems Neg Hx   . Heart disease      CHF  . Heart  attack Father     History  Substance Use Topics  . Smoking status: Former Smoker -- 3.00 packs/day for 15 years    Types: Cigarettes    Quit date: 06/09/1981  . Smokeless tobacco: Not on file     Comment: quit about 20+ yrs ago  . Alcohol Use: No    Review of Systems ROS: Statement: All systems negative except as marked or noted in the HPI; Constitutional: Negative for fever and +chills, generalized weakness/fatigue.; ; Eyes: Negative for eye pain, redness and discharge. ; ; ENMT: Negative for ear pain, hoarseness, nasal congestion, sinus pressure and sore throat. ; ; Cardiovascular: +CP. Negative for palpitations, diaphoresis, and peripheral edema. ; ; Respiratory: +SOB, cough, wheezing. Negative for stridor. ; ; Gastrointestinal: Negative for nausea, vomiting, diarrhea, abdominal pain, blood in stool, hematemesis, jaundice and rectal bleeding. . ; ; Genitourinary: Negative for dysuria, flank pain and hematuria. ; ; Musculoskeletal: Negative for back pain and neck pain. Negative for swelling and trauma.; ; Skin: Negative for pruritus, rash, abrasions, blisters, bruising and skin lesion.; ; Neuro: Negative for headache, lightheadedness and neck stiffness. Negative for weakness, altered level of consciousness , altered mental status, extremity weakness, paresthesias, involuntary movement, seizure and syncope.       Allergies  Codeine;  Promethazine hcl; Ace inhibitors; Adhesive; and Levofloxacin  Home Medications   Current Outpatient Rx  Name  Route  Sig  Dispense  Refill  . aspirin EC 325 MG tablet   Oral   Take 325 mg by mouth every morning.          . carvedilol (COREG) 12.5 MG tablet   Oral   Take 1 tablet (12.5 mg total) by mouth 2 (two) times daily with a meal.   180 tablet   3   . cetirizine (ZYRTEC) 10 MG tablet   Oral   Take 10 mg by mouth at bedtime.          . docusate sodium (COLACE) 100 MG capsule   Oral   Take 100 mg by mouth 2 (two) times daily.            . fish oil-omega-3 fatty acids 1000 MG capsule   Oral   Take 1 g by mouth 2 (two) times daily.          . Fluticasone-Salmeterol (ADVAIR DISKUS) 250-50 MCG/DOSE AEPB   Inhalation   Inhale 1 puff into the lungs every 12 (twelve) hours.           . furosemide (LASIX) 40 MG tablet   Oral   Take 1 tablet (40 mg total) by mouth every morning.   45 tablet   5   . insulin detemir (LEVEMIR) 100 UNIT/ML injection   Subcutaneous   Inject 54 Units into the skin at bedtime.          . Insulin Lispro, Human, (HUMALOG KWIKPEN [place])   Subcutaneous   Inject 15-22 Units into the skin 3 (three) times daily with meals. Per sliding scale         . losartan (COZAAR) 25 MG tablet   Oral   Take 1 tablet (25 mg total) by mouth daily.   30 tablet   5   . Probiotic Product (PROBIOTIC FORMULA PO)   Oral   Take 1 capsule by mouth every morning.          . simvastatin (ZOCOR) 20 MG tablet   Oral   Take 20 mg by mouth at bedtime.           . sodium chloride (OCEAN) 0.65 % nasal spray   Nasal   Place 1 spray into the nose daily as needed. For dryness         . spironolactone (ALDACTONE) 25 MG tablet   Oral   Take 12.5 mg by mouth every morning.         . tiotropium (SPIRIVA) 18 MCG inhalation capsule   Inhalation   Place 18 mcg into inhaler and inhale daily.             BP 117/54  Pulse 103  Temp(Src) 99.1 F (37.3 C) (Oral)  Resp 20  Ht 5' 3.5" (1.613 m)  Wt 208 lb (94.348 kg)  BMI 36.26 kg/m2  SpO2 96%  Physical Exam 0845: Physical examination:  Nursing notes reviewed; Vital signs and O2 SAT reviewed;  Constitutional: Well developed, Well nourished, Well hydrated, In no acute distress; Head:  Normocephalic, atraumatic; Eyes: EOMI, PERRL, No scleral icterus; ENMT: Mouth and pharynx normal, Mucous membranes moist; Neck: Supple, Full range of motion, No lymphadenopathy; Cardiovascular: Tachycardic rate and rhythm, No gallop; Respiratory: Breath sounds coarse & equal  bilaterally, No wheezes.  Speaking full sentences with ease, Normal respiratory effort/excursion; Chest: Nontender, No soft tissue crepitus, no rash.  Movement normal; Abdomen: Soft, Nontender, Nondistended, Normal bowel sounds; Genitourinary: No CVA tenderness; Extremities: Pulses normal, No tenderness, No edema, No calf edema or asymmetry.; Neuro: AA&Ox3, Major CN grossly intact.  Speech clear. No gross focal motor or sensory deficits in extremities.; Skin: Color normal, Warm, Dry.    ED Course  Procedures    MDM  MDM Reviewed: previous chart, nursing note and vitals Reviewed previous: labs and ECG Interpretation: labs, ECG, x-ray and CT scan    Date: 07/31/2012  Rate: 104  Rhythm: sinus tachycardia  QRS Axis: left  Intervals: QT prolonged, QTc 518  ST/T Wave abnormalities: normal  Conduction Disutrbances:right bundle branch block  Narrative Interpretation:   Old EKG Reviewed: unchanged; no significant changes from previous EKG dated 01/07/2012.  Results for orders placed during the hospital encounter of 07/31/12  TROPONIN I      Result Value Range   Troponin I <0.30  <0.30 ng/mL  PRO B NATRIURETIC PEPTIDE      Result Value Range   Pro B Natriuretic peptide (BNP) 1237.0 (*) 0 - 125 pg/mL  BASIC METABOLIC PANEL      Result Value Range   Sodium 136  135 - 145 mEq/L   Potassium 4.2  3.5 - 5.1 mEq/L   Chloride 96  96 - 112 mEq/L   CO2 29  19 - 32 mEq/L   Glucose, Bld 210 (*) 70 - 99 mg/dL   BUN 32 (*) 6 - 23 mg/dL   Creatinine, Ser 0.98  0.50 - 1.10 mg/dL   Calcium 11.9  8.4 - 14.7 mg/dL   GFR calc non Af Amer 50 (*) >90 mL/min   GFR calc Af Amer 58 (*) >90 mL/min  CBC WITH DIFFERENTIAL      Result Value Range   WBC 24.2 (*) 4.0 - 10.5 K/uL   RBC 4.61  3.87 - 5.11 MIL/uL   Hemoglobin 12.8  12.0 - 15.0 g/dL   HCT 82.9  56.2 - 13.0 %   MCV 83.9  78.0 - 100.0 fL   MCH 27.8  26.0 - 34.0 pg   MCHC 33.1  30.0 - 36.0 g/dL   RDW 86.5 (*) 78.4 - 69.6 %   Platelets 194  150 -  400 K/uL   Neutrophils Relative 88 (*) 43 - 77 %   Neutro Abs 21.4 (*) 1.7 - 7.7 K/uL   Lymphocytes Relative 4 (*) 12 - 46 %   Lymphs Abs 1.1  0.7 - 4.0 K/uL   Monocytes Relative 7  3 - 12 %   Monocytes Absolute 1.7 (*) 0.1 - 1.0 K/uL   Eosinophils Relative 0  0 - 5 %   Eosinophils Absolute 0.0  0.0 - 0.7 K/uL   Basophils Relative 0  0 - 1 %   Basophils Absolute 0.0  0.0 - 0.1 K/uL  URINALYSIS, ROUTINE W REFLEX MICROSCOPIC      Result Value Range   Color, Urine YELLOW  YELLOW   APPearance CLEAR  CLEAR   Specific Gravity, Urine 1.020  1.005 - 1.030   pH 5.5  5.0 - 8.0   Glucose, UA NEGATIVE  NEGATIVE mg/dL   Hgb urine dipstick NEGATIVE  NEGATIVE   Bilirubin Urine NEGATIVE  NEGATIVE   Ketones, ur TRACE (*) NEGATIVE mg/dL   Protein, ur NEGATIVE  NEGATIVE mg/dL   Urobilinogen, UA 0.2  0.0 - 1.0 mg/dL   Nitrite NEGATIVE  NEGATIVE   Leukocytes, UA SMALL (*) NEGATIVE  D-DIMER, QUANTITATIVE  Result Value Range   D-Dimer, Quant 0.95 (*) 0.00 - 0.48 ug/mL-FEU  URINE MICROSCOPIC-ADD ON      Result Value Range   Squamous Epithelial / LPF FEW (*) RARE   WBC, UA 3-6  <3 WBC/hpf   Bacteria, UA MANY (*) RARE   Dg Chest 2 View 07/31/2012  *RADIOLOGY REPORT*  Clinical Data: Chest pain, shortness of breath.  CHEST - 2 VIEW  Comparison: 01/07/2012  Findings: Prior median sternotomy and valve replacement. Cardiomegaly.  Opacity within the lingula likely reflects scarring although superimposed infiltrate cannot be completely excluded.  No focal opacity in the right lung.  No effusions.  No acute bony abnormality.  IMPRESSION: Increasing lingular opacity, likely scarring.  Recommend clinical correlation to completely exclude superimposed infiltrate/pneumonia.   Original Report Authenticated By: Charlett Nose, M.D.    Ct Angio Chest Pe W/cm &/or Wo Cm 07/31/2012  *RADIOLOGY REPORT*  Clinical Data: Shortness of breath.  CT ANGIOGRAPHY CHEST  Technique:  Multidetector CT imaging of the chest using the  standard protocol during bolus administration of intravenous contrast. Multiplanar reconstructed images including MIPs were obtained and reviewed to evaluate the vascular anatomy.  Contrast: OMNIPAQUE IOHEXOL 350 MG/ML SOLN  Comparison: 01/07/2012  Findings: No filling defects in the pulmonary arteries to suggest pulmonary emboli.  Airspace opacity is noted in the lingula as mentioned on plain films.  This is concerning for area of pneumonia/infection.  There is a very small focal cavitary nodule along the superior aspect of this area of consolidation.  This measures 7 mm.  Patchy ground-glass opacities are noted in the right upper lobe.  I suspect this represents areas of infection/inflammation.  Linear densities in the lung bases bilaterally, likely atelectasis.  No pleural effusions.  There is a 5 mm nodule in the right lower lobe on image 43 of series 5.  Heart is mildly enlarged.  Prior aortic valve replacement.  Aorta is normal caliber.  Scattered small mediastinal and bilateral hilar lymph nodes, likely reactive.  No axillary adenopathy.  No acute bony abnormality.  IMPRESSION: Area of consolidation in the lingula concerning for pneumonia. Small 7 mm cavitary nodule along the superior aspect of this area of consolidation, presumably also infectious.  Patchy ground-glass opacities throughout the light right lung, likely infectious/inflammatory.  5 mm nodule in the right lower lobe.  Linear densities in the lung bases, likely atelectasis.  Recommend follow-up CT in 6 months.   Original Report Authenticated By: Charlett Nose, M.D.    Results for MAURISA, TESMER (MRN [id number]) as of 07/31/2012 12:45  Ref. Range 05/30/2011 13:35 06/04/2011 16:10 01/07/2012 06:50 07/31/2012 09:10  Pro B Natriuretic peptide (BNP) Latest Range: 0-125 pg/mL 8423.0 (H) 10389.0 (H) 936.8 (H) 1237.0 (H)     1230:  Pt with prolonged QTc on EKG; will start IV rocephin and doxycycline for CAP.  IV rocephin with also cover for +UTI  while UC pending.  BNP mildly elevated from first previous, but significantly less than 2 years ago BNP x2 and no overt CHF on CXR; doubt CHF at this time.   Dx and testing d/w pt and family.  Questions answered.  Verb understanding, agreeable to admit. T/C to [facility]. Lendell Caprice, case discussed, including:  HPI, pertinent PM/SHx, VS/PE, dx testing, ED course and treatment:  Agreeable to admit, requests she will come to ED for eval.            Laray Anger, DO 07/31/12 1849

## 2012-07-31 NOTE — H&P (Addendum)
Hospital Admission Note Date: 07/31/2012  Patient name: Kathryn Morgan Medical record number: [id number] Date of birth: 1941-01-17 Age: 72 y.o. Gender: female PCP: [name], MD  Attending physician: Christiane Ha, MD  Chief Complaint: left chest pain  History of Present Illness:  Kathryn Morgan is an 72 y.o. female  who presents with pleuritic left chest pain for several days. She has had subjective fevers at home. Cough productive of green sputum. Anorexia. She vomited once this morning. She also has had a headache and sinus pressure with postnasal drip. Sore throat several days ago has resolved. Fatigue and malaise. Slight shortness of breath with some wheezing over the past day or so. No diarrhea. Workup in the emergency room is significant for white blood cell count of 24,000. Room air saturations in the high 80s. CT angiogram of the chest showing left-sided infiltrate. She has a history of COPD as well as cardiomyopathy. After tissue aortic valve replacement, her ejection fraction as measured 2 months ago has normalized. Patient has received Rocephin and doxycycline. ED physician gave her doxycycline over concerns of QT interval.  Past Medical History  Diagnosis Date  . Type 2 diabetes mellitus     Insulin pump  . Essential hypertension, benign   . Dyslipidemia   . Depression   . Necrotizing pancreatitis   . Neuropathy   . Rhabdomyolysis   . Gallstone pancreatitis     2005  . Chronic systolic heart failure     LVEF 35%  . Aortic stenosis     Moderate to severe  . Right bundle-branch block   . Rheumatic fever   . Osteoporosis   . GERD (gastroesophageal reflux disease)   . COPD (chronic obstructive pulmonary disease)   . Hepatitis   . Chronic kidney disease   . S/P aortic valve replacement 08/27/2011    23mm [facility] Ease pericardial tissue valve   Meds:  See home medication list  Allergies: Codeine; Promethazine hcl; Ace inhibitors; Adhesive; and  Levofloxacin History   Social History  . Marital Status: Married    Spouse Name: N/A    Number of Children: N/A  . Years of Education: N/A   Occupational History  . Not on file.   Social History Main Topics  . Smoking status: Former Smoker -- 3.00 packs/day for 15 years    Types: Cigarettes    Quit date: 06/09/1981  . Smokeless tobacco: Not on file     Comment: quit about 20+ yrs ago  . Alcohol Use: No  . Drug Use: No  . Sexually Active: Not on file   Other Topics Concern  . Not on file   Social History Narrative  . No narrative on file   Family History  Problem Relation Age of Onset  . Colon cancer Neg Hx   . Anesthesia problems Neg Hx   . Heart disease      CHF  . Heart attack Father    Past Surgical History  Procedure Laterality Date  . Esophagogastroduodenoscopy  04/21/04    Normal esophagus/normal D1,D2; attempted  transgastric drainage/stent placement of pancreatic pseudocyst, no obvious location for gastrotomy, therefore transferred to [facility]  . Debridement pancreas    . Pancreatic pseudocyst drainage    . Cholecystectomy    . Aortic valve replacement  08/27/2011    Procedure: AORTIC VALVE REPLACEMENT (AVR);  Surgeon: Purcell Nails, MD;  Location: [facility] OR;  Service: Open Heart Surgery;  Laterality: N/A;    Review of  Systems: Systems reviewed and as per HPI, otherwise negative.  Physical Exam: Blood pressure 107/50, pulse 101, temperature 99.1 F (37.3 C), temperature source Oral, resp. rate 21, height 5' 3.5" (1.613 m), weight 94.348 kg (208 lb), SpO2 94.00%. BP 107/50  Pulse 101  Temp(Src) 99.1 F (37.3 C) (Oral)  Resp 21  Ht 5' 3.5" (1.613 m)  Wt 94.348 kg (208 lb)  BMI 36.26 kg/m2  SpO2 94%  General Appearance:    Alert, cooperative, anxious appearing. Frequently interrupts me. Winces with deep breathing or any movement on the gurney   Head:    Normocephalic, without obvious abnormality, atraumatic  Eyes:    PERRL, conjunctiva/corneas clear,  EOM's intact, fundi    benign, both eyes  Ears:    Normal TM's and external ear canals, both ears  Nose:   boggy turbinates. Tenderness over left maxillary sinus. Nasal vocal quality.   Throat:   Lips, mucosa, and tongue normal; teeth and gums normal.mucous membranes moist   Neck:   Supple, symmetrical, trachea midline, no adenopathy;    thyroid:  no enlargement/tenderness/nodules; no carotid   bruit or JVD  Back:     Symmetric, no curvature, ROM normal, no CVA tenderness  Lungs:     Clear to auscultation bilaterally, respirations unlabored  Chest Wall:    mild left chest wall tenderness which does not reproduce the pain she is describing or deformity   Heart:    Regular rate and rhythm, S1 and S2 normal, no murmur, rub   or gallop     Abdomen:     Soft, non-tender, bowel sounds active all four quadrants,    no masses, no organomegaly  Genitalia:   deferred   Rectal:   deferred   Extremities:   Extremities normal, atraumatic, no cyanosis or edema  Pulses:   2+ and symmetric all extremities  Skin:   Skin color, texture, turgor normal, no rashes or lesions  Lymph nodes:   Cervical, supraclavicular, and axillary nodes normal  Neurologic:   CNII-XII intact, normal strength, sensation and reflexes    throughout    Psychiatric: Slightly anxious appearing  Lab results: Basic Metabolic Panel:  Recent Labs  16/10/96 0910  NA 136  K 4.2  CL 96  CO2 29  GLUCOSE 210*  BUN 32*  CREATININE 1.10  CALCIUM 10.0   Liver Function Tests: No results found for this basename: AST, ALT, ALKPHOS, BILITOT, PROT, ALBUMIN,  in the last 72 hours No results found for this basename: LIPASE, AMYLASE,  in the last 72 hours No results found for this basename: AMMONIA,  in the last 72 hours CBC:  Recent Labs  07/31/12 0910  WBC 24.2*  NEUTROABS 21.4*  HGB 12.8  HCT 38.7  MCV 83.9  PLT 194   Cardiac Enzymes:  Recent Labs  07/31/12 0910  TROPONINI <0.30   BNP:  Recent Labs   07/31/12 0910  PROBNP 1237.0*   D-Dimer:  Recent Labs  07/31/12 0910  DDIMER 0.95*   Urinalysis:  Recent Labs  07/31/12 0909  COLORURINE YELLOW  LABSPEC 1.020  PHURINE 5.5  GLUCOSEU NEGATIVE  HGBUR NEGATIVE  BILIRUBINUR NEGATIVE  KETONESUR TRACE*  PROTEINUR NEGATIVE  UROBILINOGEN 0.2  NITRITE NEGATIVE  LEUKOCYTESUR SMALL*   Imaging results:  Dg Chest 2 View  07/31/2012  *RADIOLOGY REPORT*  Clinical Data: Chest pain, shortness of breath.  CHEST - 2 VIEW  Comparison: 01/07/2012  Findings: Prior median sternotomy and valve replacement. Cardiomegaly.  Opacity within the lingula  likely reflects scarring although superimposed infiltrate cannot be completely excluded.  No focal opacity in the right lung.  No effusions.  No acute bony abnormality.  IMPRESSION: Increasing lingular opacity, likely scarring.  Recommend clinical correlation to completely exclude superimposed infiltrate/pneumonia.   Original Report Authenticated By: Charlett Nose, M.D.    Ct Angio Chest Pe W/cm &/or Wo Cm  07/31/2012  *RADIOLOGY REPORT*  Clinical Data: Shortness of breath.  CT ANGIOGRAPHY CHEST  Technique:  Multidetector CT imaging of the chest using the standard protocol during bolus administration of intravenous contrast. Multiplanar reconstructed images including MIPs were obtained and reviewed to evaluate the vascular anatomy.  Contrast: OMNIPAQUE IOHEXOL 350 MG/ML SOLN  Comparison: 01/07/2012  Findings: No filling defects in the pulmonary arteries to suggest pulmonary emboli.  Airspace opacity is noted in the lingula as mentioned on plain films.  This is concerning for area of pneumonia/infection.  There is a very small focal cavitary nodule along the superior aspect of this area of consolidation.  This measures 7 mm.  Patchy ground-glass opacities are noted in the right upper lobe.  I suspect this represents areas of infection/inflammation.  Linear densities in the lung bases bilaterally, likely  atelectasis.  No pleural effusions.  There is a 5 mm nodule in the right lower lobe on image 43 of series 5.  Heart is mildly enlarged.  Prior aortic valve replacement.  Aorta is normal caliber.  Scattered small mediastinal and bilateral hilar lymph nodes, likely reactive.  No axillary adenopathy.  No acute bony abnormality.  IMPRESSION: Area of consolidation in the lingula concerning for pneumonia. Small 7 mm cavitary nodule along the superior aspect of this area of consolidation, presumably also infectious.  Patchy ground-glass opacities throughout the light right lung, likely infectious/inflammatory.  5 mm nodule in the right lower lobe.  Linear densities in the lung bases, likely atelectasis.  Recommend follow-up CT in 6 months.   Original Report Authenticated By: Charlett Nose, M.D.     Assessment & Plan: Principal Problem:   CAP (community acquired pneumonia) with mild hypoxia: Patient's breathing is nonlabored. She will be admitted to telemetry as an inpatient. Her QTC is actually not high, when corrected for right bundle branch block. However, will continue Rocephin and doxycycline. Continue oxygen. Check urine Legionella and strep pneumonia antigens. Active Problems:   DM (diabetes mellitus), type 2: Continue outpatient medications   Benign essential HTN: Continue outpatient medications   COPD (chronic obstructive pulmonary disease): Not wheezing currently, but patient describes wheezing earlier today. Will continue outpatient medications and add scheduled albuterol inhalers.   Secondary cardiomyopathy, unspecified: Repeat echocardiogram shows normalization of ejection fraction post valve replacement. No evidence of CHF currently.   Mixed hyperlipidemia   Obesity (BMI 30-39.9)   S/P aortic valve replacement   Pleurisy: NSAIDs short-term will help.   RBBB, chronic. QTC when corrected for) or branch block is less than 500 ms. However, will monitor on telemetry for now. Mild UTI: Rocephin will  cover. Acute maxillary sinusitis: Antibiotics will cover. Will add Flonase and continue saline drops Patient wishes to take her own home medications so that her hospital bill is not as high. Will write an order stating she may take her home medicines  Deztinee Lohmeyer L 07/31/2012, 1:36 PM

## 2012-07-31 NOTE — ED Notes (Signed)
Aware awaiting all results. Denies pain "unless I move". NAD

## 2012-08-01 DIAGNOSIS — E119 Type 2 diabetes mellitus without complications: Secondary | ICD-10-CM

## 2012-08-01 DIAGNOSIS — R071 Chest pain on breathing: Secondary | ICD-10-CM

## 2012-08-01 DIAGNOSIS — D72829 Elevated white blood cell count, unspecified: Secondary | ICD-10-CM

## 2012-08-01 LAB — CBC WITH DIFFERENTIAL/PLATELET
Basophils Absolute: 0 10*3/uL (ref 0.0–0.1)
HCT: 33.2 % — ABNORMAL LOW (ref 36.0–46.0)
Lymphocytes Relative: 9 % — ABNORMAL LOW (ref 12–46)
Monocytes Absolute: 1.9 10*3/uL — ABNORMAL HIGH (ref 0.1–1.0)
Neutro Abs: 18.1 10*3/uL — ABNORMAL HIGH (ref 1.7–7.7)
RDW: 16.5 % — ABNORMAL HIGH (ref 11.5–15.5)
WBC: 21.9 10*3/uL — ABNORMAL HIGH (ref 4.0–10.5)

## 2012-08-01 LAB — URINE CULTURE

## 2012-08-01 LAB — GLUCOSE, CAPILLARY

## 2012-08-01 MED ORDER — DOXYCYCLINE HYCLATE 100 MG PO TABS: 100.0000 mg | ORAL_TABLET | Freq: Two times a day (BID) | ORAL | Status: DC

## 2012-08-01 MED ORDER — ALBUTEROL SULFATE HFA 108 (90 BASE) MCG/ACT IN AERS
2.0000 | INHALATION_SPRAY | Freq: Three times a day (TID) | RESPIRATORY_TRACT | Status: DC
  Administered 2012-08-01: 2 via RESPIRATORY_TRACT

## 2012-08-01 MED ORDER — INSULIN LISPRO 100 UNIT/ML ~~LOC~~ SOLN
10.0000 [IU] | Freq: Once | SUBCUTANEOUS | Status: AC
  Administered 2012-08-01: 10 [IU] via SUBCUTANEOUS

## 2012-08-01 MED ORDER — OXYCODONE HCL 5 MG PO TABS: 5.0000 mg | ORAL_TABLET | ORAL | Status: DC | PRN

## 2012-08-01 MED ORDER — CEFUROXIME AXETIL 500 MG PO TABS: 500.0000 mg | ORAL_TABLET | Freq: Two times a day (BID) | ORAL | Status: DC

## 2012-08-01 NOTE — Progress Notes (Signed)
UR Chart Review Completed  

## 2012-08-01 NOTE — Progress Notes (Signed)
Inpatient Diabetes Program Recommendations  AACE/ADA: New Consensus Statement on Inpatient Glycemic Control (2013)  Target Ranges:  Prepandial:   less than 140 mg/dL      Peak postprandial:   less than 180 mg/dL (1-2 hours)      Critically ill patients:  140 - 180 mg/dL   Results for Kathryn Morgan, Kathryn Morgan (MRN [id number]) as of 08/01/2012 08:37  Ref. Range 07/31/2012 14:01 07/31/2012 16:14 07/31/2012 18:36 07/31/2012 20:36 07/31/2012 23:32 08/01/2012 07:38  Glucose-Capillary Latest Range: 70-99 mg/dL 409 (H) 811 (H) 914 (H) 401 (H) 421 (H) 348 (H)   Results for Kathryn Morgan, Kathryn Morgan (MRN [id number]) as of 08/01/2012 08:37  Ref. Range 08/23/2011 11:26  Hemoglobin A1C Latest Range: <5.7 % 8.3 (H)   Inpatient Diabetes Program Recommendations Insulin - Basal: Please consider increasing Levemir to 64 units QHS and continue to adjust accordingly to improve glycemic control. HgbA1C: Please consider ordering an A1C to determine glycemic control over the last 2-3 months.  Last A1C in the chart was 8.3% on 08/23/2011.  Note: Patient has a history of diabetes and takes Levemir 54 units QHS and Humalog 15-22 units TID at home for diabetes management.  Currently, patient is ordered to receive Levemir 54 units QHS, Humalog 15-19 units AC, and Humalog 2-5 units QHS for inpatient glycemic control.  Blood glucose has ranged from 245-421 mg/dl over the past 19 hours.  Please consider increasing Levemir to 64 units QHS and ordering an A1C.  Noted that patient has an order to use medications from home.  Plan to visit with patient today and ensure that insulins are as ordered and not out of date.  Will continue to follow.  Thanks, Orlando Penner, RN, BSN, CCRN Diabetes Coordinator Inpatient Diabetes Program [phone]

## 2012-08-01 NOTE — Discharge Summary (Signed)
Physician Discharge Summary  Kathryn Morgan JXB:[id number] DOB: [birth year] DOA: 07/31/2012  PCP: [name], MD  Admit date: 07/31/2012 Discharge date: 08/01/2012  Time spent: Greater than 30 minutes  Recommendations for Outpatient Follow-up:  1. Followup with primary care physician in the next 3-4 weeks, repeat chest x-ray in 4-6 weeks.  Discharge Diagnoses:  1. Community-acquired pneumonia, lingular on CT chest scan, associated with pleurisy. 2. Type 2 diabetes mellitus. 3. COPD, stable. 4. Cardiomyopathy.    Discharge Condition: Stable and improved.  Diet recommendation: Carbohydrate modified diet.  Filed Weights   07/31/12 0830 07/31/12 1451  Weight: 208 lb (94.348 kg) 207 lb 4.8 oz (94.031 kg)    History of present illness:  This very pleasant 72 year old lady presented to the hospital with symptoms left-sided chest pain. Please see initial history as outlined below: Kathryn Morgan is an 72 y.o. female who presents with pleuritic left chest pain for several days. She has had subjective fevers at home. Cough productive of green sputum. Anorexia. She vomited once this morning. She also has had a headache and sinus pressure with postnasal drip. Sore throat several days ago has resolved. Fatigue and malaise. Slight shortness of breath with some wheezing over the past day or so. No diarrhea. Workup in the emergency room is significant for white blood cell count of 24,000. Room air saturations in the high 80s. CT angiogram of the chest showing left-sided infiltrate. She has a history of COPD as well as cardiomyopathy. After tissue aortic valve replacement, her ejection fraction as measured 2 months ago has normalized. Patient has received Rocephin and doxycycline. ED physician gave her doxycycline over concerns of QT interval.  Hospital Course:  The patient was admitted and started on intravenous antibiotics appropriately. She was given supplemental oxygen. However she did not  feel  dyspneic. Her cough is improved. She does not feel short of breath. She has no fever. She is keen to go home.  Procedures:  None.   Consultations:  None.  Discharge Exam: Filed Vitals:   07/31/12 1539 07/31/12 2151 07/31/12 2159 08/01/12 0814  BP:  99/58    Pulse:  83 103   Temp:  98.1 F (36.7 C) 97.3 F (36.3 C)   TempSrc:  Oral Oral   Resp:  20    Height:      Weight:      SpO2: 93% 94%  94%    General: She looks systemically well. She is not toxic or septic. There is no respiratory distress. Cardiovascular: Heart sounds are present without gallop rhythm. He is not clinically in heart failure. Respiratory: Lung fields show some crackles in the left mid and lower zones. There is no bronchial breathing. There is no wheezing. She is alert and orientated.  Discharge Instructions   Future Appointments Provider Department Dept Phone   08/16/2012 9:00 AM [facility] [phone]   Patient should wear two piece clothing and wear no powder or deodorant. Patient should arrive 15 minutes early.   08/28/2012 2:00 PM Jonelle Sidle, MD [facility] [place] [phone]       Medication List    TAKE these medications       ADVAIR DISKUS 250-50 MCG/DOSE Aepb  Generic drug:  Fluticasone-Salmeterol  Inhale 1 puff into the lungs every 12 (twelve) hours.     aspirin EC 325 MG tablet  Take 325 mg by mouth every morning.     carvedilol 12.5 MG tablet  Commonly known as:  COREG  Take 1 tablet (12.5 mg total) by mouth 2 (two) times daily with a meal.     cefUROXime 500 MG tablet  Commonly known as:  CEFTIN  Take 1 tablet (500 mg total) by mouth 2 (two) times daily.     cetirizine 10 MG tablet  Commonly known as:  ZYRTEC  Take 10 mg by mouth at bedtime.     docusate sodium 100 MG capsule  Commonly known as:  COLACE  Take 100 mg by mouth 2 (two) times daily.     doxycycline 100 MG tablet  Commonly known as:  VIBRA-TABS  Take 1 tablet  (100 mg total) by mouth every 12 (twelve) hours.     fish oil-omega-3 fatty acids 1000 MG capsule  Take 1 g by mouth 2 (two) times daily.     furosemide 40 MG tablet  Commonly known as:  LASIX  Take 1 tablet (40 mg total) by mouth every morning.     HUMALOG KWIKPEN [place]  Inject 15-22 Units into the skin 3 (three) times daily with meals. Per sliding scale     insulin detemir 100 UNIT/ML injection  Commonly known as:  LEVEMIR  Inject 54 Units into the skin at bedtime.     losartan 25 MG tablet  Commonly known as:  COZAAR  Take 1 tablet (25 mg total) by mouth daily.     oxyCODONE 5 MG immediate release tablet  Commonly known as:  Oxy IR/ROXICODONE  Take 1 tablet (5 mg total) by mouth every 4 (four) hours as needed.     PROBIOTIC FORMULA PO  Take 1 capsule by mouth every morning.     simvastatin 20 MG tablet  Commonly known as:  ZOCOR  Take 20 mg by mouth at bedtime.     sodium chloride 0.65 % nasal spray  Commonly known as:  OCEAN  Place 1 spray into the nose daily as needed. For dryness     spironolactone 25 MG tablet  Commonly known as:  ALDACTONE  Take 12.5 mg by mouth every morning.     tiotropium 18 MCG inhalation capsule  Commonly known as:  SPIRIVA  Place 18 mcg into inhaler and inhale daily.          The results of significant diagnostics from this hospitalization (including imaging, microbiology, ancillary and laboratory) are listed below for reference.    Significant Diagnostic Studies: Dg Chest 2 View  07/31/2012  *RADIOLOGY REPORT*  Clinical Data: Chest pain, shortness of breath.  CHEST - 2 VIEW  Comparison: 01/07/2012  Findings: Prior median sternotomy and valve replacement. Cardiomegaly.  Opacity within the lingula likely reflects scarring although superimposed infiltrate cannot be completely excluded.  No focal opacity in the right lung.  No effusions.  No acute bony abnormality.  IMPRESSION: Increasing lingular opacity, likely scarring.  Recommend  clinical correlation to completely exclude superimposed infiltrate/pneumonia.   Original Report Authenticated By: Charlett Nose, M.D.    Ct Angio Chest Pe W/cm &/or Wo Cm  07/31/2012  *RADIOLOGY REPORT*  Clinical Data: Shortness of breath.  CT ANGIOGRAPHY CHEST  Technique:  Multidetector CT imaging of the chest using the standard protocol during bolus administration of intravenous contrast. Multiplanar reconstructed images including MIPs were obtained and reviewed to evaluate the vascular anatomy.  Contrast: OMNIPAQUE IOHEXOL 350 MG/ML SOLN  Comparison: 01/07/2012  Findings: No filling defects in the pulmonary arteries to suggest pulmonary emboli.  Airspace opacity is noted in the lingula as mentioned on plain films.  This is concerning for area of pneumonia/infection.  There is a very small focal cavitary nodule along the superior aspect of this area of consolidation.  This measures 7 mm.  Patchy ground-glass opacities are noted in the right upper lobe.  I suspect this represents areas of infection/inflammation.  Linear densities in the lung bases bilaterally, likely atelectasis.  No pleural effusions.  There is a 5 mm nodule in the right lower lobe on image 43 of series 5.  Heart is mildly enlarged.  Prior aortic valve replacement.  Aorta is normal caliber.  Scattered small mediastinal and bilateral hilar lymph nodes, likely reactive.  No axillary adenopathy.  No acute bony abnormality.  IMPRESSION: Area of consolidation in the lingula concerning for pneumonia. Small 7 mm cavitary nodule along the superior aspect of this area of consolidation, presumably also infectious.  Patchy ground-glass opacities throughout the light right lung, likely infectious/inflammatory.  5 mm nodule in the right lower lobe.  Linear densities in the lung bases, likely atelectasis.  Recommend follow-up CT in 6 months.   Original Report Authenticated By: Charlett Nose, M.D.         Labs: Basic Metabolic Panel:  Recent  Labs Lab 07/31/12 0910 07/31/12 2331  NA 136  --   K 4.2  --   CL 96  --   CO2 29  --   GLUCOSE 210* 463*  BUN 32*  --   CREATININE 1.10  --   CALCIUM 10.0  --        CBC:  Recent Labs Lab 07/31/12 0910 08/01/12 0350  WBC 24.2* 21.9*  NEUTROABS 21.4* 18.1*  HGB 12.8 10.7*  HCT 38.7 33.2*  MCV 83.9 85.6  PLT 194 162   Cardiac Enzymes:  Recent Labs Lab 07/31/12 0910  TROPONINI <0.30   BNP: BNP (last 3 results)  Recent Labs  01/07/12 0650 07/31/12 0910  PROBNP 936.8* 1237.0*   CBG:  Recent Labs Lab 07/31/12 1614 07/31/12 1836 07/31/12 2036 07/31/12 2332 08/01/12 0738  GLUCAP 377* 409* 401* 421* 348*       Signed:  Kazuto Sevey C  [facility] 08/01/2012, 8:40 AM

## 2012-08-08 ENCOUNTER — Other Ambulatory Visit (HOSPITAL_COMMUNITY): Payer: Self-pay | Admitting: Family Medicine

## 2012-08-08 DIAGNOSIS — R809 Proteinuria, unspecified: Secondary | ICD-10-CM

## 2012-08-08 DIAGNOSIS — I10 Essential (primary) hypertension: Secondary | ICD-10-CM

## 2012-08-08 DIAGNOSIS — J449 Chronic obstructive pulmonary disease, unspecified: Secondary | ICD-10-CM

## 2012-08-08 DIAGNOSIS — E1165 Type 2 diabetes mellitus with hyperglycemia: Secondary | ICD-10-CM

## 2012-08-16 ENCOUNTER — Ambulatory Visit (HOSPITAL_COMMUNITY)
Admission: RE | Admit: 2012-08-16 | Discharge: 2012-08-16 | Disposition: A | Discharge status: Home / Self Care | Payer: Medicare Other | Source: Ambulatory Visit | Attending: Family Medicine | Admitting: Family Medicine

## 2012-08-16 DIAGNOSIS — Z09 Encounter for follow-up examination after completed treatment for conditions other than malignant neoplasm: Secondary | ICD-10-CM

## 2012-08-16 DIAGNOSIS — N63 Unspecified lump in unspecified breast: Secondary | ICD-10-CM | POA: Insufficient documentation

## 2012-08-28 ENCOUNTER — Ambulatory Visit (INDEPENDENT_AMBULATORY_CARE_PROVIDER_SITE_OTHER): Payer: Medicare Other | Admitting: Cardiology

## 2012-08-28 ENCOUNTER — Encounter: Payer: Self-pay | Admitting: Cardiology

## 2012-08-28 VITALS — BP 140/78 | HR 88 | Ht 63.5 in | Wt 210.4 lb

## 2012-08-28 DIAGNOSIS — J441 Chronic obstructive pulmonary disease with (acute) exacerbation: Secondary | ICD-10-CM

## 2012-08-28 DIAGNOSIS — J449 Chronic obstructive pulmonary disease, unspecified: Secondary | ICD-10-CM

## 2012-08-28 DIAGNOSIS — I10 Essential (primary) hypertension: Secondary | ICD-10-CM

## 2012-08-28 DIAGNOSIS — I429 Cardiomyopathy, unspecified: Secondary | ICD-10-CM

## 2012-08-28 MED ORDER — FUROSEMIDE 40 MG PO TABS: 40.0000 mg | ORAL_TABLET | Freq: Every morning | ORAL | Status: DC

## 2012-08-28 NOTE — Assessment & Plan Note (Signed)
No change in current antihypertensive regimen. 

## 2012-08-28 NOTE — Assessment & Plan Note (Signed)
LVEF improved to the range of 50-55% by echocardiogram in December 2013. Continue medical therapy.

## 2012-08-28 NOTE — Progress Notes (Signed)
Clinical Summary Ms. Cutrona is a 72 y.o.female last seen in December 2013. States that she has been doing reasonably well. Does have recurring difficulty with her COPD. She reports compliance with her medications.  Echocardiogram in December 2013 showed improved LVEF to the range of 50-55%, stable aortic bioprosthesis with normal function. Cozaar was added at the last visit, followup BMET showing potassium 4.3, BUN 26, creatinine 0.9.  Record review finds hospitalization in February of this year due to community-acquired pneumonia with pleurisy.  Her weight is up 3 pounds since February. She continues on standing diuretics, advances her Lasix dose as needed. We have discussed her diet and sodium restriction.  Allergies  Allergen Reactions  . Codeine Itching  . Promethazine Hcl Anxiety and Other (See Comments)    Pt and husband state severe hallucinations and paranoia from phenegran  . Ace Inhibitors Cough  . Adhesive (Tape) Other (See Comments)    Skin tearing  . Levofloxacin Itching and Other (See Comments)    Patient unsure of exact reaction    Current Outpatient Prescriptions  Medication Sig Dispense Refill  . aspirin EC 325 MG tablet Take 325 mg by mouth every morning.       . carvedilol (COREG) 12.5 MG tablet Take 1 tablet (12.5 mg total) by mouth 2 (two) times daily with a meal.  180 tablet  3  . cefUROXime (CEFTIN) 500 MG tablet Take 1 tablet (500 mg total) by mouth 2 (two) times daily.  14 tablet  0  . cetirizine (ZYRTEC) 10 MG tablet Take 10 mg by mouth at bedtime.       . docusate sodium (COLACE) 100 MG capsule Take 100 mg by mouth 2 (two) times daily.        Marland Kitchen doxycycline (VIBRA-TABS) 100 MG tablet Take 1 tablet (100 mg total) by mouth every 12 (twelve) hours.  14 tablet  0  . fish oil-omega-3 fatty acids 1000 MG capsule Take 1 g by mouth 2 (two) times daily.       . Fluticasone-Salmeterol (ADVAIR DISKUS) 250-50 MCG/DOSE AEPB Inhale 1 puff into the lungs every 12  (twelve) hours.        . furosemide (LASIX) 40 MG tablet Take 1 tablet (40 mg total) by mouth every morning.  180 tablet  1  . insulin detemir (LEVEMIR) 100 UNIT/ML injection Inject 54 Units into the skin at bedtime.       . Insulin Lispro, Human, (HUMALOG KWIKPEN [place]) Inject 15-22 Units into the skin 3 (three) times daily with meals. Per sliding scale      . losartan (COZAAR) 25 MG tablet Take 1 tablet (25 mg total) by mouth daily.  30 tablet  5  . oxyCODONE (OXY IR/ROXICODONE) 5 MG immediate release tablet Take 1 tablet (5 mg total) by mouth every 4 (four) hours as needed.  30 tablet  0  . Probiotic Product (PROBIOTIC FORMULA PO) Take 1 capsule by mouth every morning.       . simvastatin (ZOCOR) 20 MG tablet Take 20 mg by mouth at bedtime.        . sodium chloride (OCEAN) 0.65 % nasal spray Place 1 spray into the nose daily as needed. For dryness      . spironolactone (ALDACTONE) 25 MG tablet Take 12.5 mg by mouth every morning.      . tiotropium (SPIRIVA) 18 MCG inhalation capsule Place 18 mcg into inhaler and inhale daily.        . pantoprazole (PROTONIX)  40 MG tablet        No current facility-administered medications for this visit.    Past Medical History  Diagnosis Date  . Type 2 diabetes mellitus     Insulin pump  . Essential hypertension, benign   . Dyslipidemia   . Depression   . Necrotizing pancreatitis   . Neuropathy   . Rhabdomyolysis   . Gallstone pancreatitis     2005  . Chronic systolic heart failure     LVEF 35%  . Aortic stenosis     Moderate to severe  . Right bundle-branch block   . Rheumatic fever   . Osteoporosis   . GERD (gastroesophageal reflux disease)   . COPD (chronic obstructive pulmonary disease)   . Hepatitis   . Chronic kidney disease   . S/P aortic valve replacement 08/27/2011    23mm [facility] Ease pericardial tissue valve    Social History Ms. Pavlovic reports that she quit smoking about 31 years ago. Her smoking use included Cigarettes.  She has a 45 pack-year smoking history. She does not have any smokeless tobacco history on file. Ms. Miranda reports that she does not drink alcohol.  Review of Systems No palpitations, bleeding problems. Reports weight does fluctuate, mild leg edema. Otherwise negative.  Physical Examination Filed Vitals:   08/28/12 1345  BP: 140/78  Pulse: 88   Filed Weights   08/28/12 1345  Weight: 210 lb 6.4 oz (95.437 kg)    No acute distress.  HEENT: Conjunctiva and lids normal, oropharynx clear with moist mucosa.  Neck: Supple, no elevated JVP, no thyromegaly.  Lungs: Clear to auscultation, nonlabored breathing at rest.  Cardiac: Regular rate and rhythm, no S3, soft systolic murmur, no pericardial rub.  Abdomen: Soft, nontender, bowel sounds present, no guarding or rebound.  Extremities: No pitting edema, distal pulses 2+.  Skin: Warm and dry.  Musculoskeletal: No kyphosis.  Neuropsychiatric: Alert and oriented x3, affect grossly appropriate.   Problem List and Plan   S/P aortic valve replacement Clinically stable status post bioprosthetic AVR, recently evaluated by echocardiogram in December 2013.  Secondary cardiomyopathy, unspecified LVEF improved to the range of 50-55% by echocardiogram in December 2013. Continue medical therapy.  Essential hypertension, benign No change in current antihypertensive regimen.  COPD (chronic obstructive pulmonary disease) Patient requests referral to our Pulmonary Division, Dr. Craige Cotta, for followup and management of COPD.    Jonelle Sidle, M.D., F.A.C.C.

## 2012-08-28 NOTE — Assessment & Plan Note (Signed)
Patient requests referral to our Pulmonary Division, Dr. Craige Cotta, for followup and management of COPD.

## 2012-08-28 NOTE — Assessment & Plan Note (Signed)
Clinically stable status post bioprosthetic AVR, recently evaluated by echocardiogram in December 2013.

## 2012-08-28 NOTE — Patient Instructions (Addendum)
Your physician recommends that you schedule a follow-up appointment in: 4 months with MD SM  YOUR PHYSICIAN RECOMMENDS THAT YOU RE-ESTABLISH CARE WITH PULMONOLOGIST MD SOOD, A REFERRAL HAS BEEN PLACED IN YOUR CHART, SOMEONE WILL CONTACT YOU ABOUT THE APT DATE AND TIME

## 2012-09-06 ENCOUNTER — Telehealth: Payer: Self-pay | Admitting: *Deleted

## 2012-09-06 NOTE — Telephone Encounter (Signed)
Noted apt with MD SOOD is pending

## 2012-10-04 ENCOUNTER — Ambulatory Visit (HOSPITAL_COMMUNITY)
Admission: RE | Admit: 2012-10-04 | Discharge: 2012-10-04 | Disposition: A | Discharge status: Home / Self Care | Payer: Medicare Other | Source: Ambulatory Visit | Attending: Family Medicine | Admitting: Family Medicine

## 2012-10-04 DIAGNOSIS — E1129 Type 2 diabetes mellitus with other diabetic kidney complication: Secondary | ICD-10-CM

## 2012-10-04 DIAGNOSIS — I10 Essential (primary) hypertension: Secondary | ICD-10-CM

## 2012-10-04 DIAGNOSIS — J4489 Other specified chronic obstructive pulmonary disease: Secondary | ICD-10-CM

## 2012-10-04 DIAGNOSIS — R809 Proteinuria, unspecified: Secondary | ICD-10-CM

## 2012-10-04 DIAGNOSIS — Z87891 Personal history of nicotine dependence: Secondary | ICD-10-CM | POA: Insufficient documentation

## 2012-10-04 DIAGNOSIS — Z954 Presence of other heart-valve replacement: Secondary | ICD-10-CM | POA: Insufficient documentation

## 2012-10-04 DIAGNOSIS — E1165 Type 2 diabetes mellitus with hyperglycemia: Secondary | ICD-10-CM

## 2012-10-04 DIAGNOSIS — J449 Chronic obstructive pulmonary disease, unspecified: Secondary | ICD-10-CM | POA: Insufficient documentation

## 2012-10-13 ENCOUNTER — Other Ambulatory Visit: Payer: Self-pay | Admitting: Gastroenterology

## 2012-10-16 NOTE — Telephone Encounter (Signed)
Patient needs OV for further refills (E30).

## 2012-10-16 NOTE — Telephone Encounter (Signed)
Pt is aware she needs an OV and will call us back.

## 2012-10-18 ENCOUNTER — Institutional Professional Consult (permissible substitution): Payer: Medicare Other | Admitting: Pulmonary Disease

## 2012-11-01 ENCOUNTER — Telehealth: Payer: Self-pay | Admitting: Cardiology

## 2012-11-01 NOTE — Telephone Encounter (Signed)
Please return patient's call regarding RX for scale. / tgs

## 2012-11-03 NOTE — Telephone Encounter (Signed)
.  left message to have patient return my call.  

## 2012-11-04 ENCOUNTER — Other Ambulatory Visit: Payer: Self-pay | Admitting: Cardiology

## 2012-11-07 NOTE — Telephone Encounter (Signed)
Pt advised that she has not been able to get in touch with the nurse that has been checking her weights for about 6 weeks, per sent her the scales back and wanted to make sure they received the scales, also to see if she needs another RX sent in for the scales again per was advised to have the doctor send in another RX to continue to have the scales sent again, noted contact number [phone] ext [phone] Delialah as contact name, advised I will contact her and get back with pt asap, pt understood, left vm with delilah to call office

## 2012-11-07 NOTE — Telephone Encounter (Signed)
.  left message to have patient return my call.  

## 2012-11-13 NOTE — Telephone Encounter (Signed)
Left message with Deliahah to call this nurse at this office

## 2012-11-16 ENCOUNTER — Other Ambulatory Visit: Payer: Self-pay | Admitting: *Deleted

## 2012-11-16 MED ORDER — SPIRONOLACTONE 25 MG PO TABS: 12.5000 mg | ORAL_TABLET | Freq: Every morning | ORAL | Status: DC

## 2012-11-21 NOTE — Telephone Encounter (Signed)
Pt confirmed that Kathryn Morgan did return her call and she received her scales in the mail today and no RX was needed. Pt understood

## 2013-01-10 ENCOUNTER — Other Ambulatory Visit: Payer: Self-pay

## 2013-01-18 ENCOUNTER — Encounter (HOSPITAL_COMMUNITY): Payer: Self-pay

## 2013-01-18 ENCOUNTER — Emergency Department (HOSPITAL_COMMUNITY)
Admission: EM | Admit: 2013-01-18 | Discharge: 2013-01-19 | Disposition: A | Discharge status: Home / Self Care | Payer: Medicare Other | Attending: Emergency Medicine | Admitting: Emergency Medicine

## 2013-01-18 ENCOUNTER — Emergency Department (HOSPITAL_COMMUNITY): Payer: Medicare Other

## 2013-01-18 DIAGNOSIS — Z8719 Personal history of other diseases of the digestive system: Secondary | ICD-10-CM | POA: Insufficient documentation

## 2013-01-18 DIAGNOSIS — E119 Type 2 diabetes mellitus without complications: Secondary | ICD-10-CM | POA: Insufficient documentation

## 2013-01-18 DIAGNOSIS — J449 Chronic obstructive pulmonary disease, unspecified: Secondary | ICD-10-CM | POA: Insufficient documentation

## 2013-01-18 DIAGNOSIS — F3289 Other specified depressive episodes: Secondary | ICD-10-CM | POA: Insufficient documentation

## 2013-01-18 DIAGNOSIS — W1809XA Striking against other object with subsequent fall, initial encounter: Secondary | ICD-10-CM | POA: Insufficient documentation

## 2013-01-18 DIAGNOSIS — I129 Hypertensive chronic kidney disease with stage 1 through stage 4 chronic kidney disease, or unspecified chronic kidney disease: Secondary | ICD-10-CM | POA: Insufficient documentation

## 2013-01-18 DIAGNOSIS — F329 Major depressive disorder, single episode, unspecified: Secondary | ICD-10-CM | POA: Insufficient documentation

## 2013-01-18 DIAGNOSIS — S1093XA Contusion of unspecified part of neck, initial encounter: Secondary | ICD-10-CM | POA: Insufficient documentation

## 2013-01-18 DIAGNOSIS — Z954 Presence of other heart-valve replacement: Secondary | ICD-10-CM | POA: Insufficient documentation

## 2013-01-18 DIAGNOSIS — Z794 Long term (current) use of insulin: Secondary | ICD-10-CM | POA: Insufficient documentation

## 2013-01-18 DIAGNOSIS — Y9289 Other specified places as the place of occurrence of the external cause: Secondary | ICD-10-CM | POA: Insufficient documentation

## 2013-01-18 DIAGNOSIS — K219 Gastro-esophageal reflux disease without esophagitis: Secondary | ICD-10-CM | POA: Insufficient documentation

## 2013-01-18 DIAGNOSIS — J4489 Other specified chronic obstructive pulmonary disease: Secondary | ICD-10-CM | POA: Insufficient documentation

## 2013-01-18 DIAGNOSIS — E785 Hyperlipidemia, unspecified: Secondary | ICD-10-CM | POA: Insufficient documentation

## 2013-01-18 DIAGNOSIS — S0990XA Unspecified injury of head, initial encounter: Secondary | ICD-10-CM | POA: Insufficient documentation

## 2013-01-18 DIAGNOSIS — Y9389 Activity, other specified: Secondary | ICD-10-CM | POA: Insufficient documentation

## 2013-01-18 DIAGNOSIS — IMO0002 Reserved for concepts with insufficient information to code with codable children: Secondary | ICD-10-CM | POA: Insufficient documentation

## 2013-01-18 DIAGNOSIS — Z79899 Other long term (current) drug therapy: Secondary | ICD-10-CM | POA: Insufficient documentation

## 2013-01-18 DIAGNOSIS — N189 Chronic kidney disease, unspecified: Secondary | ICD-10-CM | POA: Insufficient documentation

## 2013-01-18 DIAGNOSIS — Z7982 Long term (current) use of aspirin: Secondary | ICD-10-CM | POA: Insufficient documentation

## 2013-01-18 DIAGNOSIS — I5022 Chronic systolic (congestive) heart failure: Secondary | ICD-10-CM | POA: Insufficient documentation

## 2013-01-18 DIAGNOSIS — M810 Age-related osteoporosis without current pathological fracture: Secondary | ICD-10-CM | POA: Insufficient documentation

## 2013-01-18 DIAGNOSIS — Z87891 Personal history of nicotine dependence: Secondary | ICD-10-CM | POA: Insufficient documentation

## 2013-01-18 DIAGNOSIS — S0003XA Contusion of scalp, initial encounter: Secondary | ICD-10-CM | POA: Insufficient documentation

## 2013-01-18 DIAGNOSIS — Z8619 Personal history of other infectious and parasitic diseases: Secondary | ICD-10-CM | POA: Insufficient documentation

## 2013-01-18 DIAGNOSIS — Z8669 Personal history of other diseases of the nervous system and sense organs: Secondary | ICD-10-CM | POA: Insufficient documentation

## 2013-01-18 DIAGNOSIS — Z8679 Personal history of other diseases of the circulatory system: Secondary | ICD-10-CM | POA: Insufficient documentation

## 2013-01-18 DIAGNOSIS — Z8739 Personal history of other diseases of the musculoskeletal system and connective tissue: Secondary | ICD-10-CM | POA: Insufficient documentation

## 2013-01-18 LAB — GLUCOSE, CAPILLARY: Glucose-Capillary: 225 mg/dL — ABNORMAL HIGH (ref 70–99)

## 2013-01-18 MED ORDER — HYDROCODONE-ACETAMINOPHEN 5-325 MG PO TABS
1.0000 | ORAL_TABLET | Freq: Once | ORAL | Status: AC
  Administered 2013-01-18: 1 via ORAL
  Filled 2013-01-18: qty 1

## 2013-01-18 NOTE — ED Notes (Signed)
[place] collar on pt. Family at the bedside.

## 2013-01-18 NOTE — ED Notes (Signed)
While giving self insulin with her leg propped up on a chair, the chair slipped and pt fell to floor, striking head on door frame. No loc. Did give herself the 25units of humalog insulin in preperation for a large dinner which she did not eat

## 2013-01-18 NOTE — ED Notes (Signed)
Family wanting pt to have pain medication. Ask pt if she needed medication for pain, she does not want.

## 2013-01-18 NOTE — ED Notes (Signed)
Ok to remove collar per PA

## 2013-01-18 NOTE — ED Provider Notes (Signed)
CSN: [id number]     Arrival date & time 01/18/13  2033 History     First MD Initiated Contact with Patient 01/18/13 2103     Chief Complaint  Patient presents with  . Head Injury   (Consider location/radiation/quality/duration/timing/severity/associated sxs/prior Treatment) HPI Comments: Kathryn Morgan is a 72 y.o. Female presenting with a fall which occurred just prior to arrival.  She had her leg propped on a chair to give herself an insulin injection prior to her evening meal (happened at 7:30 pm) when the chair slipped,  Causing her to fall backward hitting her head on a door frame and the floor.  She denies loc , but was unable to get off the floor without assistance from her husband. She reports having a headache and posterior swelling at the site of the injury without bleeding.  She denies visual changes,  Nausea, vomiting,  Dizziness or focal weakness.  She has taken tylenol prior to arrival without relief of headache pain.     The history is provided by the patient.    Past Medical History  Diagnosis Date  . Type 2 diabetes mellitus     Insulin pump  . Essential hypertension, benign   . Dyslipidemia   . Depression   . Necrotizing pancreatitis   . Neuropathy   . Rhabdomyolysis   . Gallstone pancreatitis     2005  . Chronic systolic heart failure     LVEF 35%  . Aortic stenosis     Moderate to severe  . Right bundle-branch block   . Rheumatic fever   . Osteoporosis   . GERD (gastroesophageal reflux disease)   . COPD (chronic obstructive pulmonary disease)   . Chronic kidney disease   . S/P aortic valve replacement 08/27/2011    23mm [facility] Ease pericardial tissue valve   Past Surgical History  Procedure Laterality Date  . Esophagogastroduodenoscopy  04/21/04    Normal esophagus/normal D1,D2; attempted  transgastric drainage/stent placement of pancreatic pseudocyst, no obvious location for gastrotomy, therefore transferred to [facility]  . Debridement  pancreas    . Pancreatic pseudocyst drainage    . Cholecystectomy    . Aortic valve replacement  08/27/2011    Procedure: AORTIC VALVE REPLACEMENT (AVR);  Surgeon: Purcell Nails, MD;  Location: [facility] OR;  Service: Open Heart Surgery;  Laterality: N/A;   Family History  Problem Relation Age of Onset  . Colon cancer Neg Hx   . Anesthesia problems Neg Hx   . Heart disease      CHF  . Heart attack Father    History  Substance Use Topics  . Smoking status: Former Smoker -- 3.00 packs/day for 15 years    Types: Cigarettes    Quit date: 06/09/1981  . Smokeless tobacco: Not on file     Comment: quit about 20+ yrs ago  . Alcohol Use: No   OB History   Grav Para Term Preterm Abortions TAB SAB Ect Mult Living                 Review of Systems  Constitutional: Negative for fever.  HENT: Negative for congestion, sore throat, facial swelling, neck pain, tinnitus and ear discharge.   Eyes: Negative.  Negative for visual disturbance.  Respiratory: Negative for chest tightness and shortness of breath.   Cardiovascular: Negative for chest pain.  Gastrointestinal: Negative for nausea, vomiting and abdominal pain.  Genitourinary: Negative.   Musculoskeletal: Negative for joint swelling and arthralgias.  Skin: Negative.  Negative for rash and wound.  Neurological: Positive for headaches. Negative for dizziness, weakness, light-headedness and numbness.  Psychiatric/Behavioral: Negative.     Allergies  Codeine; Promethazine hcl; Ace inhibitors; Adhesive; and Levofloxacin  Home Medications   Current Outpatient Rx  Name  Route  Sig  Dispense  Refill  . albuterol (PROVENTIL HFA;VENTOLIN HFA) 108 (90 BASE) MCG/ACT inhaler   Inhalation   Inhale 2 puffs into the lungs every 6 (six) hours as needed for wheezing or shortness of breath.         Marland Kitchen aspirin EC 325 MG tablet   Oral   Take 325 mg by mouth every morning.          . Calcium-Magnesium-Vitamin D (CALCIUM 1200+D3 PO)   Oral   Take  1 tablet by mouth 2 (two) times daily.         Marland Kitchen EXPIRED: carvedilol (COREG) 12.5 MG tablet   Oral   Take 1 tablet (12.5 mg total) by mouth 2 (two) times daily with a meal.   180 tablet   3   . docusate sodium (COLACE) 100 MG capsule   Oral   Take 100 mg by mouth 2 (two) times daily.           . fish oil-omega-3 fatty acids 1000 MG capsule   Oral   Take 1 g by mouth 2 (two) times daily.          . Fluticasone-Salmeterol (ADVAIR DISKUS) 250-50 MCG/DOSE AEPB   Inhalation   Inhale 1 puff into the lungs every 12 (twelve) hours.           . furosemide (LASIX) 40 MG tablet   Oral   Take 1 tablet (40 mg total) by mouth every morning.   180 tablet   1   . GuaiFENesin (MUCINEX PO)   Oral   Take 1 tablet by mouth daily as needed (for congestion and allergy related symptoms).         . insulin detemir (LEVEMIR) 100 UNIT/ML injection   Subcutaneous   Inject 60 Units into the skin at bedtime.          . Insulin Lispro, Human, (HUMALOG KWIKPEN [place])   Subcutaneous   Inject 15-22 Units into the skin 3 (three) times daily with meals. Per sliding scale         . losartan (COZAAR) 25 MG tablet      Take 1 tablet by mouth  every day   90 tablet   3   . Magnesium 400 MG CAPS   Oral   Take 1 capsule by mouth daily.         . pantoprazole (PROTONIX) 40 MG tablet   Oral   Take 40 mg by mouth daily.          . Probiotic Product (PROBIOTIC FORMULA PO)   Oral   Take 1 capsule by mouth every morning.          . simvastatin (ZOCOR) 20 MG tablet   Oral   Take 20 mg by mouth at bedtime.           Marland Kitchen spironolactone (ALDACTONE) 25 MG tablet   Oral   Take 0.5 tablets (12.5 mg total) by mouth every morning.   45 tablet   2   . tiotropium (SPIRIVA) 18 MCG inhalation capsule   Inhalation   Place 18 mcg into inhaler and inhale every morning.          Marland Kitchen HYDROcodone-acetaminophen (  NORCO/VICODIN) 5-325 MG per tablet   Oral   Take 1 tablet by mouth every 4 (four)  hours as needed for pain.   15 tablet   0   . sodium chloride (OCEAN) 0.65 % nasal spray   Nasal   Place 1 spray into the nose daily as needed. For dryness          BP 153/73  Pulse 89  Temp(Src) 97.2 F (36.2 C) (Oral)  Resp 18  Ht 5\' 4"  (1.626 m)  Wt 210 lb (95.255 kg)  BMI 36.03 kg/m2  SpO2 94% Physical Exam  Nursing note and vitals reviewed. Constitutional: She is oriented to person, place, and time. She appears well-developed and well-nourished.  Uncomfortable appearing  HENT:  Head: Normocephalic. Head is with contusion. Head is without raccoon's eyes and without Battle's sign.  Right Ear: No hemotympanum.  Left Ear: No hemotympanum.  Mouth/Throat: Oropharynx is clear and moist.  Occipital hematoma.  Skin intact.  Eyes: Conjunctivae and EOM are normal. Pupils are equal, round, and reactive to light.  Neck: Normal range of motion. Neck supple. Muscular tenderness present. No spinous process tenderness present.  Cardiovascular: Normal rate, regular rhythm, normal heart sounds and intact distal pulses.   Pulmonary/Chest: Effort normal and breath sounds normal. She has no wheezes.  Abdominal: Soft. Bowel sounds are normal. There is no tenderness.  Musculoskeletal: Normal range of motion.       Cervical back: She exhibits no tenderness.       Lumbar back: She exhibits no tenderness.  Lymphadenopathy:    She has no cervical adenopathy.  Neurological: She is alert and oriented to person, place, and time. She has normal strength. No sensory deficit. Gait normal. GCS eye subscore is 4. GCS verbal subscore is 5. GCS motor subscore is 6.  Cranial nerves III-XII intact.  No pronator drift.  Skin: Skin is warm and dry. No rash noted.  Psychiatric: She has a normal mood and affect. Her speech is normal and behavior is normal. Thought content normal. Cognition and memory are normal.    ED Course   Procedures (including critical care time)  Labs Reviewed  GLUCOSE, CAPILLARY -  Abnormal; Notable for the following:    Glucose-Capillary 225 (*)    All other components within normal limits  GLUCOSE, CAPILLARY - Abnormal; Notable for the following:    Glucose-Capillary 163 (*)    All other components within normal limits   Ct Head Wo Contrast  01/18/2013   *RADIOLOGY REPORT*  Clinical Data:  Head injury.  CT HEAD WITHOUT CONTRAST CT CERVICAL SPINE WITHOUT CONTRAST  Technique:  Multidetector CT imaging of the head and cervical spine was performed following the standard protocol without intravenous contrast.  Multiplanar CT image reconstructions of the cervical spine were also generated.  Comparison:   None  CT HEAD  Findings:  Calvarium:Right parietal scalp contusion without underlying fracture. No lytic or blastic lesion.  Orbits: No acute abnormality.  Brain: No evidence of acute abnormality, such as acute infarction, hemorrhage, hydrocephalus, or mass lesion/mass effect.Patchy bilateral cerebral white matter low attenuation, usually related to chronic small vessel ischemia.  IMPRESSION: 1. No evidence of acute intracranial disease. 2.  Chronic small vessel ischemic changes. 3.  Right parietal scalp contusion without underlying fracture.  CT CERVICAL SPINE  Findings: Negative for acute fracture or subluxation.  For age, degenerative disc changes are mild.  Mild facet degenerative changes in the cervical region, more advanced at T1-T2. No prevertebral edema.  IMPRESSION:  No evidence of acute cervical spine injury.   Original Report Authenticated By: Tiburcio Pea   Ct Cervical Spine Wo Contrast  01/18/2013   *RADIOLOGY REPORT*  Clinical Data:  Head injury.  CT HEAD WITHOUT CONTRAST CT CERVICAL SPINE WITHOUT CONTRAST  Technique:  Multidetector CT imaging of the head and cervical spine was performed following the standard protocol without intravenous contrast.  Multiplanar CT image reconstructions of the cervical spine were also generated.  Comparison:   None  CT HEAD  Findings:   Calvarium:Right parietal scalp contusion without underlying fracture. No lytic or blastic lesion.  Orbits: No acute abnormality.  Brain: No evidence of acute abnormality, such as acute infarction, hemorrhage, hydrocephalus, or mass lesion/mass effect.Patchy bilateral cerebral white matter low attenuation, usually related to chronic small vessel ischemia.  IMPRESSION: 1. No evidence of acute intracranial disease. 2.  Chronic small vessel ischemic changes. 3.  Right parietal scalp contusion without underlying fracture.  CT CERVICAL SPINE  Findings: Negative for acute fracture or subluxation.  For age, degenerative disc changes are mild.  Mild facet degenerative changes in the cervical region, more advanced at T1-T2. No prevertebral edema.  IMPRESSION: No evidence of acute cervical spine injury.   Original Report Authenticated By: Tiburcio Pea   1. Minor head injury without loss of consciousness, initial encounter     MDM  Patients labs and/or radiological studies were viewed and considered during the medical decision making and disposition process. Pt was also discussed with Dr. Bebe Shaggy.  Minor head injury instructions given.  Pt stable for dc.  Few hydrocodone prescribed if needed,  Cautioned regarding sedation, advised to use sparingly.  Prn f/u.  Burgess Amor, PA-C 01/19/13 1339

## 2013-01-19 MED ORDER — HYDROCODONE-ACETAMINOPHEN 5-325 MG PO TABS: 1.0000 | ORAL_TABLET | ORAL | Status: DC | PRN

## 2013-01-19 NOTE — ED Notes (Signed)
Patient given discharge instruction, verbalized understand. Patient ambulatory out of the department.  

## 2013-01-22 NOTE — ED Provider Notes (Signed)
Medical screening examination/treatment/procedure(s) were conducted as a shared visit with non-physician practitioner(s) and myself.  I personally evaluated the patient during the encounter  Pt well appearing on my eval, GCS 15, no distress, CT imaging negative, stable for d/c.  Pt agreeable with plan   Joya Gaskins, MD 01/22/13 [phone]

## 2013-01-29 ENCOUNTER — Other Ambulatory Visit: Payer: Self-pay | Admitting: *Deleted

## 2013-01-29 MED ORDER — CARVEDILOL 12.5 MG PO TABS: 12.5000 mg | ORAL_TABLET | Freq: Two times a day (BID) | ORAL | Status: DC

## 2013-02-19 ENCOUNTER — Encounter: Payer: Self-pay | Admitting: *Deleted

## 2013-02-19 ENCOUNTER — Other Ambulatory Visit: Payer: Self-pay | Admitting: *Deleted

## 2013-02-19 MED ORDER — SPIRONOLACTONE 25 MG PO TABS: 12.5000 mg | ORAL_TABLET | Freq: Every morning | ORAL | Status: DC

## 2013-02-20 ENCOUNTER — Telehealth: Payer: Self-pay | Admitting: *Deleted

## 2013-02-20 MED ORDER — SPIRONOLACTONE 25 MG PO TABS: 12.5000 mg | ORAL_TABLET | Freq: Every morning | ORAL | Status: DC

## 2013-02-20 NOTE — Telephone Encounter (Signed)
PT WAS DENIDE SPIRONOLACTONE REFILL BECAUSE SHE IS PAST DUE ON F/U  SHE HAS MADE APPT FOR 03/06/13 WITH DR MCDOWELL.  PLEASE CALL IN TO [facility]

## 2013-02-20 NOTE — Telephone Encounter (Signed)
Medication sent via escribe.  

## 2013-03-06 ENCOUNTER — Ambulatory Visit (INDEPENDENT_AMBULATORY_CARE_PROVIDER_SITE_OTHER): Payer: Medicare Other | Admitting: Cardiology

## 2013-03-06 ENCOUNTER — Encounter: Payer: Self-pay | Admitting: Cardiology

## 2013-03-06 VITALS — BP 114/58 | HR 84 | Ht 63.5 in | Wt 214.0 lb

## 2013-03-06 DIAGNOSIS — I429 Cardiomyopathy, unspecified: Secondary | ICD-10-CM

## 2013-03-06 DIAGNOSIS — Z952 Presence of prosthetic heart valve: Secondary | ICD-10-CM

## 2013-03-06 DIAGNOSIS — Z954 Presence of other heart-valve replacement: Secondary | ICD-10-CM

## 2013-03-06 DIAGNOSIS — I10 Essential (primary) hypertension: Secondary | ICD-10-CM

## 2013-03-06 DIAGNOSIS — E782 Mixed hyperlipidemia: Secondary | ICD-10-CM

## 2013-03-06 MED ORDER — CARVEDILOL 12.5 MG PO TABS: 12.5000 mg | ORAL_TABLET | Freq: Two times a day (BID) | ORAL | Status: DC

## 2013-03-06 MED ORDER — LOSARTAN POTASSIUM 25 MG PO TABS: 25.0000 mg | ORAL_TABLET | Freq: Every day | ORAL | Status: DC

## 2013-03-06 MED ORDER — SPIRONOLACTONE 25 MG PO TABS: 12.5000 mg | ORAL_TABLET | Freq: Every morning | ORAL | Status: DC

## 2013-03-06 MED ORDER — FUROSEMIDE 40 MG PO TABS: 40.0000 mg | ORAL_TABLET | Freq: Every morning | ORAL | Status: DC

## 2013-03-06 NOTE — Assessment & Plan Note (Signed)
LVEF improved the range of 50-55% by last assessment. 

## 2013-03-06 NOTE — Assessment & Plan Note (Signed)
Examination is stable. Echocardiogram in December 2013 reviewed with stable bioprosthetic function.

## 2013-03-06 NOTE — Patient Instructions (Addendum)

## 2013-03-06 NOTE — Assessment & Plan Note (Signed)
Good blood pressure control today. 

## 2013-03-06 NOTE — Progress Notes (Signed)
Clinical Summary Ms. Liptak is a 72 y.o.female last seen in March. She reports that things have been going well. She tries to walk on her treadmill at least a few days a week. No angina symptoms, progressive shortness of breath. No fevers or chills. She reports compliance with her medications, also followup with endocrinology.  Echocardiogram in December 2013 showed improved LVEF to the range of 50-55%, stable aortic bioprosthesis with normal function. We reviewed this today.   Allergies  Allergen Reactions  . Codeine Itching  . Promethazine Hcl Anxiety and Other (See Comments)    Pt and husband state severe hallucinations and paranoia from phenegran  . Ace Inhibitors Cough  . Adhesive [Tape] Other (See Comments)    Skin tearing  . Levofloxacin Itching and Other (See Comments)    Patient unsure of exact reaction    Current Outpatient Prescriptions  Medication Sig Dispense Refill  . albuterol (PROVENTIL HFA;VENTOLIN HFA) 108 (90 BASE) MCG/ACT inhaler Inhale 2 puffs into the lungs every 6 (six) hours as needed for wheezing or shortness of breath.      Marland Kitchen aspirin EC 325 MG tablet Take 325 mg by mouth every morning.       . Calcium-Magnesium-Vitamin D (CALCIUM 1200+D3 PO) Take 1 tablet by mouth 2 (two) times daily.      . carvedilol (COREG) 12.5 MG tablet Take 1 tablet (12.5 mg total) by mouth 2 (two) times daily with a meal.  60 tablet  3  . docusate sodium (COLACE) 100 MG capsule Take 100 mg by mouth 2 (two) times daily.        . fish oil-omega-3 fatty acids 1000 MG capsule Take 1 g by mouth 2 (two) times daily.       . Fluticasone-Salmeterol (ADVAIR DISKUS) 250-50 MCG/DOSE AEPB Inhale 1 puff into the lungs every 12 (twelve) hours.        . furosemide (LASIX) 40 MG tablet Take 1 tablet (40 mg total) by mouth every morning.  180 tablet  1  . GuaiFENesin (MUCINEX PO) Take 1 tablet by mouth daily as needed (for congestion and allergy related symptoms).      . insulin detemir (LEVEMIR)  100 UNIT/ML injection Inject 60 Units into the skin at bedtime.       . Insulin Lispro, Human, (HUMALOG KWIKPEN [place]) Inject 15-22 Units into the skin 3 (three) times daily with meals. Per sliding scale      . losartan (COZAAR) 25 MG tablet Take 1 tablet by mouth  every day  90 tablet  3  . Magnesium 400 MG CAPS Take 1 capsule by mouth daily.      . Probiotic Product (PROBIOTIC FORMULA PO) Take 1 capsule by mouth every morning.       . simvastatin (ZOCOR) 20 MG tablet Take 20 mg by mouth at bedtime.        . sodium chloride (OCEAN) 0.65 % nasal spray Place 1 spray into the nose daily as needed. For dryness      . spironolactone (ALDACTONE) 25 MG tablet Take 0.5 tablets (12.5 mg total) by mouth every morning.  45 tablet  0  . tiotropium (SPIRIVA) 18 MCG inhalation capsule Place 18 mcg into inhaler and inhale every morning.        No current facility-administered medications for this visit.    Past Medical History  Diagnosis Date  . Type 2 diabetes mellitus     Insulin pump  . Essential hypertension, benign   .  Dyslipidemia   . Depression   . Necrotizing pancreatitis   . Neuropathy   . Rhabdomyolysis   . Gallstone pancreatitis     2005  . Chronic systolic heart failure     LVEF 35%  . Aortic stenosis     Status post AVR  . Right bundle-branch block   . Rheumatic fever   . Osteoporosis   . GERD (gastroesophageal reflux disease)   . COPD (chronic obstructive pulmonary disease)   . Chronic kidney disease   . S/P aortic valve replacement 08/27/2011    23mm [facility] Ease pericardial tissue valve    Past Surgical History  Procedure Laterality Date  . Esophagogastroduodenoscopy  04/21/04    Normal esophagus/normal D1,D2; attempted  transgastric drainage/stent placement of pancreatic pseudocyst, no obvious location for gastrotomy, therefore transferred to [facility]  . Debridement pancreas    . Pancreatic pseudocyst drainage    . Cholecystectomy    . Aortic valve replacement   08/27/2011    Procedure: AORTIC VALVE REPLACEMENT (AVR);  Surgeon: Purcell Nails, MD;  Location: [facility] OR;  Service: Open Heart Surgery;  Laterality: N/A;    Social History Ms. Copland reports that she quit smoking about 31 years ago. Her smoking use included Cigarettes. She has a 45 pack-year smoking history. She does not have any smokeless tobacco history on file. Ms. Velasques reports that she does not drink alcohol.  Review of Systems The palpitations, orthopnea, PND. Still using Lasix for intermittent leg edema. Stable appetite. Otherwise negative.  Physical Examination Filed Vitals:   03/06/13 1441  BP: 114/58  Pulse: 84   Filed Weights   03/06/13 1441  Weight: 214 lb (97.07 kg)    No acute distress.  HEENT: Conjunctiva and lids normal, oropharynx clear with moist mucosa.  Neck: Supple, no elevated JVP, no thyromegaly.  Lungs: Clear to auscultation, nonlabored breathing at rest.  Cardiac: Regular rate and rhythm, no S3, soft systolic murmur, no pericardial rub.  Abdomen: Soft, nontender, bowel sounds present, no guarding or rebound.  Extremities: No pitting edema, distal pulses 2+.  Skin: Warm and dry.  Musculoskeletal: No kyphosis.  Neuropsychiatric: Alert and oriented x3, affect grossly appropriate.   Problem List and Plan   S/P aortic valve replacement Examination is stable. Echocardiogram in December 2013 reviewed with stable bioprosthetic function.  Secondary cardiomyopathy, unspecified LVEF improved the range of 50-55% by last assessment.  Essential hypertension, benign Good blood pressure control today.  Mixed hyperlipidemia Followed by Dr. Fransico Him on statin therapy.    Jonelle Sidle, M.D., F.A.C.C.

## 2013-03-06 NOTE — Addendum Note (Signed)
Addended by: Eustace Moore on: 03/06/2013 03:15 PM   Modules accepted: Orders

## 2013-03-06 NOTE — Assessment & Plan Note (Signed)
Followed by Dr. Fransico Him on statin therapy.

## 2013-03-14 IMAGING — CR DG CHEST 2V
2 series · 2 of 2 positions shown · non-contrast
Comparison: 09/20/2011

CLINICAL DATA: Postop.  heart surgery.

CHEST - 2 VIEW

[w chest pa]
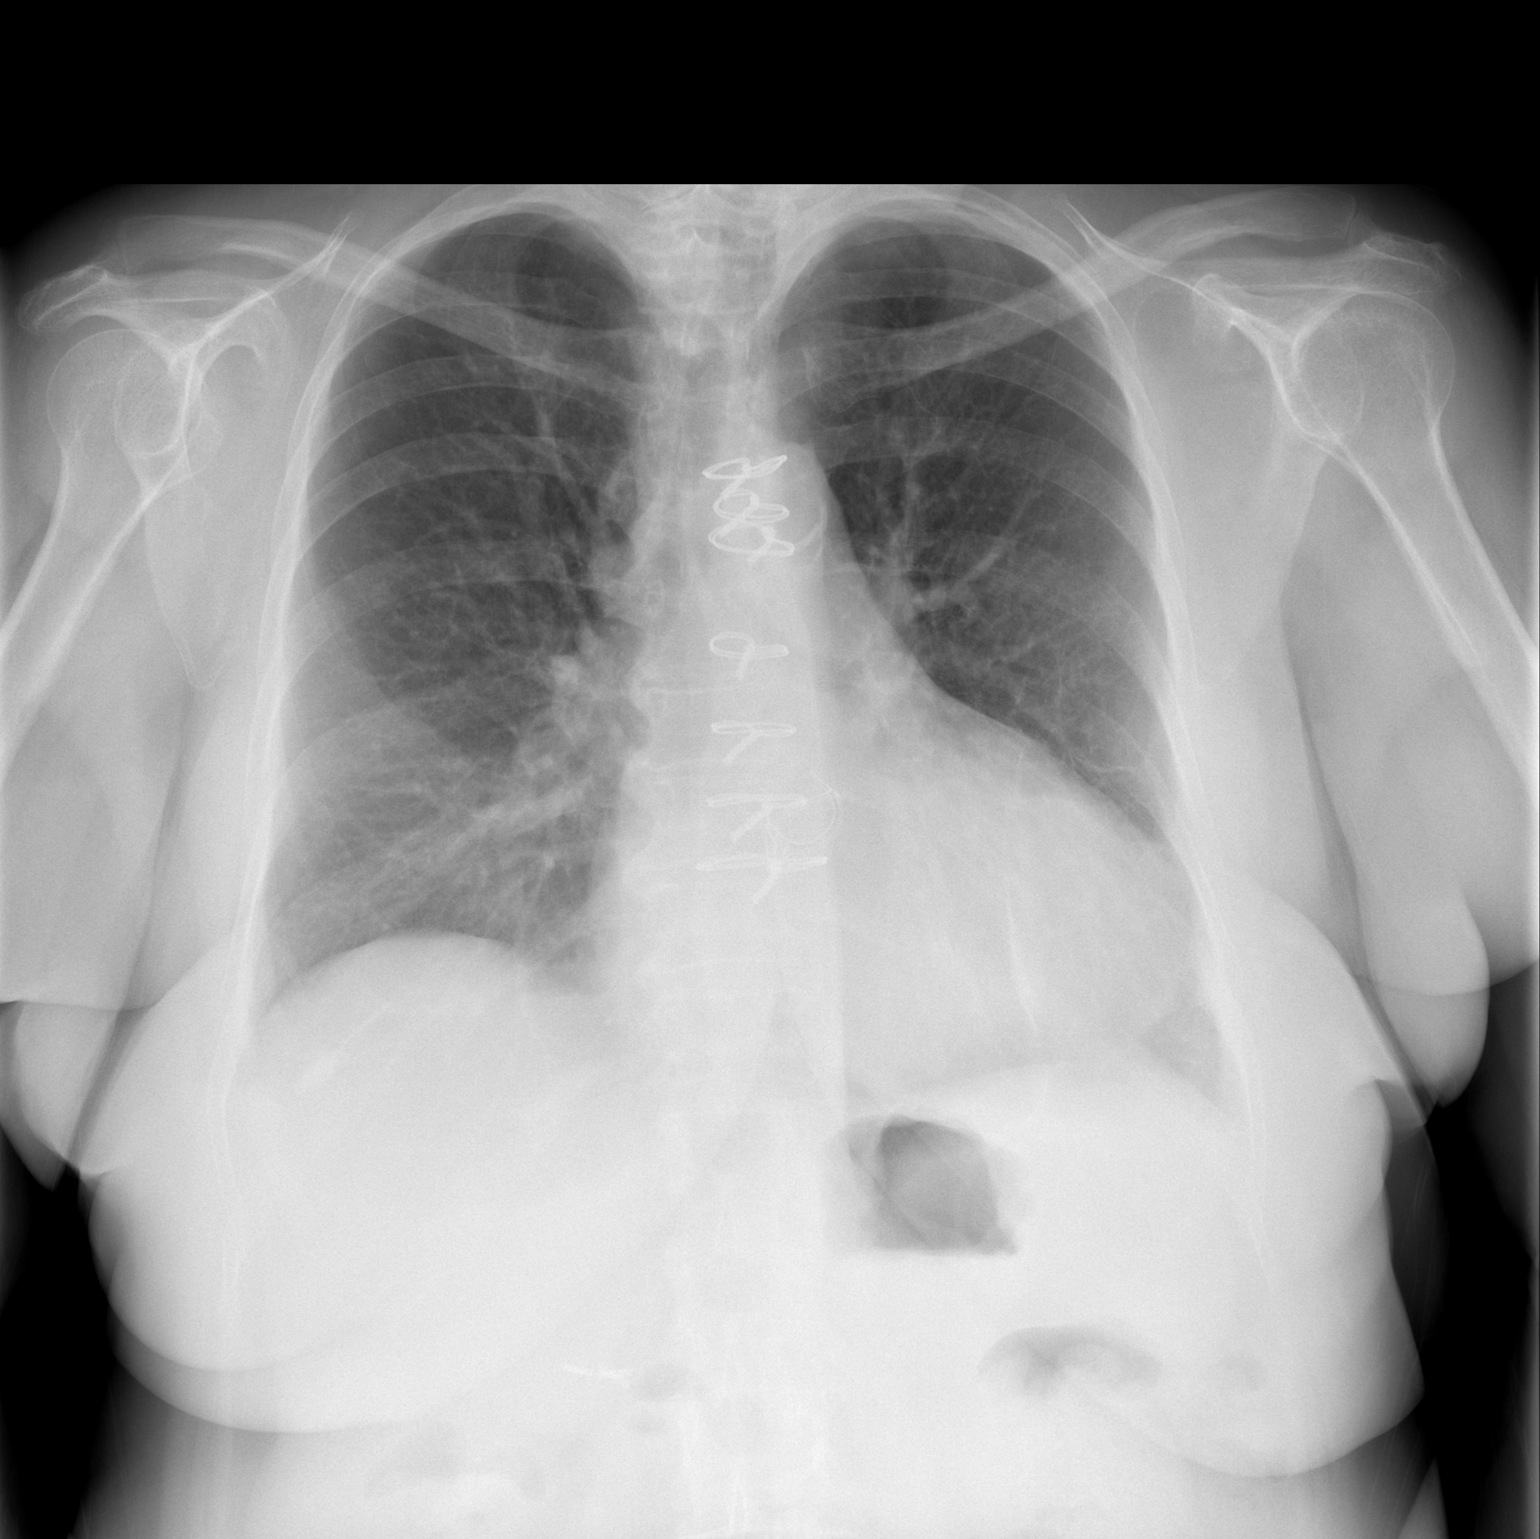

[w chest lat]
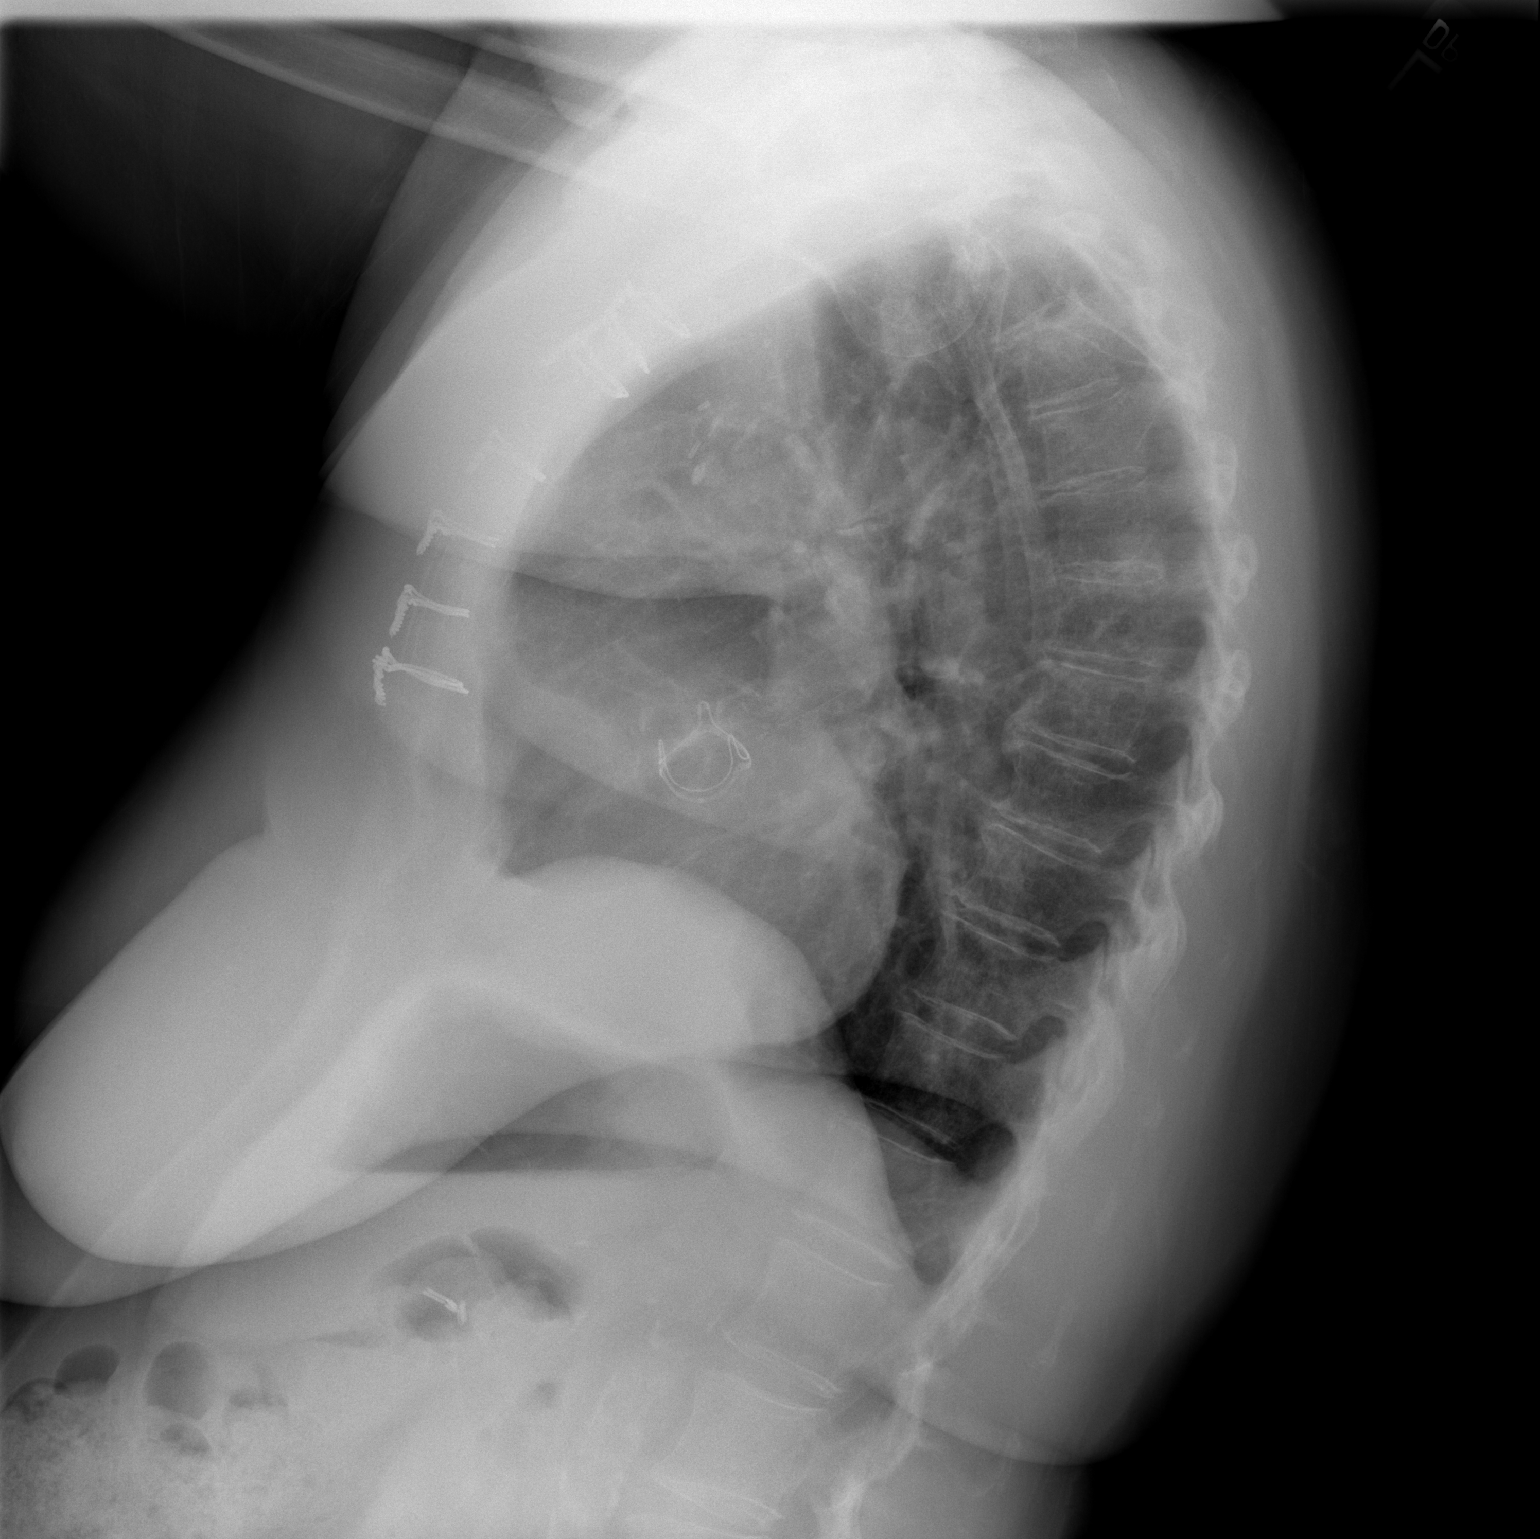

[2 of 2 positions shown; findings below may reference images not displayed]

FINDINGS: Prior valve replacement.  Mild cardiomegaly.  Linear
atelectasis or scarring in the left base.  Right lung is clear.  No
effusions.  No acute bony abnormality.
IMPRESSION: Cardiomegaly.  Left basilar scarring or atelectasis.

## 2013-04-12 ENCOUNTER — Other Ambulatory Visit: Payer: Self-pay

## 2013-07-30 ENCOUNTER — Other Ambulatory Visit: Payer: Self-pay | Admitting: Cardiology

## 2013-10-08 ENCOUNTER — Other Ambulatory Visit: Payer: Self-pay | Admitting: Cardiology

## 2013-10-19 ENCOUNTER — Encounter: Payer: Self-pay | Admitting: Cardiology

## 2013-10-19 ENCOUNTER — Ambulatory Visit (INDEPENDENT_AMBULATORY_CARE_PROVIDER_SITE_OTHER): Payer: Medicare Other | Admitting: Cardiology

## 2013-10-19 VITALS — BP 129/64 | HR 84 | Ht 63.0 in | Wt 214.0 lb

## 2013-10-19 DIAGNOSIS — I429 Cardiomyopathy, unspecified: Secondary | ICD-10-CM

## 2013-10-19 DIAGNOSIS — E119 Type 2 diabetes mellitus without complications: Secondary | ICD-10-CM

## 2013-10-19 DIAGNOSIS — I10 Essential (primary) hypertension: Secondary | ICD-10-CM

## 2013-10-19 DIAGNOSIS — Z954 Presence of other heart-valve replacement: Secondary | ICD-10-CM

## 2013-10-19 DIAGNOSIS — Z952 Presence of prosthetic heart valve: Secondary | ICD-10-CM

## 2013-10-19 MED ORDER — SPIRONOLACTONE 25 MG PO TABS: 12.5000 mg | ORAL_TABLET | Freq: Every day | ORAL | Status: DC

## 2013-10-19 MED ORDER — CARVEDILOL 12.5 MG PO TABS: 12.5000 mg | ORAL_TABLET | Freq: Two times a day (BID) | ORAL | Status: DC

## 2013-10-19 MED ORDER — FUROSEMIDE 40 MG PO TABS: 40.0000 mg | ORAL_TABLET | Freq: Every morning | ORAL | Status: DC

## 2013-10-19 MED ORDER — LOSARTAN POTASSIUM 25 MG PO TABS: 25.0000 mg | ORAL_TABLET | Freq: Every day | ORAL | Status: DC

## 2013-10-19 NOTE — Assessment & Plan Note (Signed)
LVEF improved the range of 50-55% by last assessment.

## 2013-10-19 NOTE — Progress Notes (Signed)
Clinical Summary Kathryn Morgan is a 73 y.o.female last seen in September 2014. She has been doing very well. States that she is walking for exercise, has lost weight, and that her blood sugar is better controlled with medication adjustments. She reports NYHA class II dyspnea, no chest pain.  Echocardiogram in December 2013 showed improved LVEF to the range of 50-55%, stable aortic bioprosthesis with normal function.  ECG today shows sinus rhythm with right bundle branch block and left anterior fascicular block, old.   Allergies  Allergen Reactions  . Codeine Itching  . Promethazine Hcl Anxiety and Other (See Comments)    Pt and husband state severe hallucinations and paranoia from phenegran  . Ace Inhibitors Cough  . Adhesive [Tape] Other (See Comments)    Skin tearing  . Levofloxacin Itching and Other (See Comments)    Patient unsure of exact reaction    Current Outpatient Prescriptions  Medication Sig Dispense Refill  . albuterol (PROVENTIL HFA;VENTOLIN HFA) 108 (90 BASE) MCG/ACT inhaler Inhale 2 puffs into the lungs every 6 (six) hours as needed for wheezing or shortness of breath.      Marland Kitchen aspirin EC 325 MG tablet Take 325 mg by mouth every morning.       . Canagliflozin (INVOKANA) 100 MG TABS Take 100 mg by mouth every morning.      . carvedilol (COREG) 12.5 MG tablet Take 1 tablet (12.5 mg total) by mouth 2 (two) times daily with a meal.  60 tablet  3  . docusate sodium (COLACE) 100 MG capsule Take 100 mg by mouth 2 (two) times daily.        . fish oil-omega-3 fatty acids 1000 MG capsule Take 1 g by mouth 2 (two) times daily.       . Fluticasone-Salmeterol (ADVAIR DISKUS) 250-50 MCG/DOSE AEPB Inhale 1 puff into the lungs every 12 (twelve) hours.        . furosemide (LASIX) 40 MG tablet Take 1 tablet (40 mg total) by mouth every morning.  90 tablet  3  . insulin detemir (LEVEMIR) 100 UNIT/ML injection Inject 60 Units into the skin at bedtime.       . Insulin Lispro, Human,  (HUMALOG KWIKPEN [place]) Inject 15-22 Units into the skin 3 (three) times daily with meals. Per sliding scale      . losartan (COZAAR) 25 MG tablet Take 1 tablet (25 mg total) by mouth daily.  90 tablet  3  . Magnesium 400 MG CAPS Take 1 capsule by mouth daily.      . Probiotic Product (PROBIOTIC FORMULA PO) Take 1 capsule by mouth every morning.       . simvastatin (ZOCOR) 20 MG tablet Take 20 mg by mouth at bedtime.        Marland Kitchen spironolactone (ALDACTONE) 25 MG tablet Take one-half tablet by  mouth every morning.  45 tablet  0  . tiotropium (SPIRIVA) 18 MCG inhalation capsule Place 18 mcg into inhaler and inhale every morning.        No current facility-administered medications for this visit.    Past Medical History  Diagnosis Date  . Type 2 diabetes mellitus     Insulin pump  . Essential hypertension, benign   . Dyslipidemia   . Depression   . Necrotizing pancreatitis   . Neuropathy   . Rhabdomyolysis   . Gallstone pancreatitis     2005  . Chronic systolic heart failure     LVEF 35%  . Aortic  stenosis     Status post [facility]mm [facility] pericardial tissue valve  . Right bundle-branch block   . Rheumatic fever   . Osteoporosis   . GERD (gastroesophageal reflux disease)   . COPD (chronic obstructive pulmonary disease)   . Chronic kidney disease     Past Surgical History  Procedure Laterality Date  . Esophagogastroduodenoscopy  04/21/04    Normal esophagus/normal D1,D2; attempted  transgastric drainage/stent placement of pancreatic pseudocyst, no obvious location for gastrotomy, [facility]  . Debridement pancreas    . Pancreatic pseudocyst drainage    . Cholecystectomy    . Aortic valve replacement  08/27/2011    Procedure: AORTIC VALVE REPLACEMENT (AVR);  Surgeon: Purcell Nailslarence H Owen, MD;  [facility];  Service: Open Heart Surgery;  Laterality: N/A;    Social History Kathryn Morgan reports that she quit smoking about 32 years ago. Her smoking use  included Cigarettes. She has a 45 pack-year smoking history. She does not have any smokeless tobacco history on file. Kathryn Morgan reports that she does not drink alcohol.  Review of Systems Negative except as outlined.  Physical Examination Filed Vitals:   10/19/13 1544  BP: 129/64  Pulse: 84   Filed Weights   10/19/13 1544  Weight: 214 lb (97.07 kg)    No acute distress.  HEENT: Conjunctiva and lids normal, oropharynx clear with moist mucosa.  Neck: Supple, no elevated JVP, no thyromegaly.  Lungs: Clear to auscultation, nonlabored breathing at rest.  Cardiac: Regular rate and rhythm, no S3, soft systolic murmur, no pericardial rub.  Abdomen: Soft, nontender, bowel sounds present, no guarding or rebound.  Extremities: No pitting edema, distal pulses 2+.  Skin: Warm and dry.  Musculoskeletal: No kyphosis.  Neuropsychiatric: Alert and oriented x3, affect grossly appropriate.   Problem List and Plan   S/P aortic valve replacement Symptomatically stable with no change on examination. Echocardiogram reviewed above. Continue observation.  Secondary cardiomyopathy, unspecified LVEF improved the range of 50-55% by last assessment.  Essential hypertension, benign Blood pressure better controlled, no changes made today.  DM (diabetes mellitus), type 2 Keep followup with Dr. Regino SchultzeMcGough.    Jonelle SidleSamuel G. Maleka Contino, M.D., F.A.C.C.

## 2013-10-19 NOTE — Assessment & Plan Note (Signed)
Keep followup with Dr. McGough. 

## 2013-10-19 NOTE — Patient Instructions (Signed)
Your physician wants you to follow-up in: 6 months You will receive a reminder letter in the mail two months in advance. If you don't receive a letter, please call our office to schedule the follow-up appointment.     Your physician recommends that you continue on your current medications as directed. Please refer to the Current Medication list given to you today.      Thank you for choosing [facility] !        

## 2013-10-19 NOTE — Assessment & Plan Note (Signed)
Blood pressure better controlled, no changes made today.

## 2013-10-19 NOTE — Assessment & Plan Note (Signed)
Symptomatically stable with no change on examination. Echocardiogram reviewed above. Continue observation.

## 2013-12-09 ENCOUNTER — Emergency Department (HOSPITAL_COMMUNITY): Payer: Medicare Other

## 2013-12-09 ENCOUNTER — Emergency Department (HOSPITAL_COMMUNITY)
Admission: EM | Admit: 2013-12-09 | Discharge: 2013-12-09 | Disposition: A | Discharge status: Home / Self Care | Payer: Medicare Other | Attending: Emergency Medicine | Admitting: Emergency Medicine

## 2013-12-09 ENCOUNTER — Encounter (HOSPITAL_COMMUNITY): Payer: Self-pay | Admitting: Emergency Medicine

## 2013-12-09 DIAGNOSIS — Z8659 Personal history of other mental and behavioral disorders: Secondary | ICD-10-CM | POA: Insufficient documentation

## 2013-12-09 DIAGNOSIS — J449 Chronic obstructive pulmonary disease, unspecified: Secondary | ICD-10-CM | POA: Insufficient documentation

## 2013-12-09 DIAGNOSIS — G608 Other hereditary and idiopathic neuropathies: Secondary | ICD-10-CM | POA: Insufficient documentation

## 2013-12-09 DIAGNOSIS — IMO0002 Reserved for concepts with insufficient information to code with codable children: Secondary | ICD-10-CM | POA: Insufficient documentation

## 2013-12-09 DIAGNOSIS — Z794 Long term (current) use of insulin: Secondary | ICD-10-CM | POA: Insufficient documentation

## 2013-12-09 DIAGNOSIS — M545 Low back pain, unspecified: Secondary | ICD-10-CM | POA: Insufficient documentation

## 2013-12-09 DIAGNOSIS — M810 Age-related osteoporosis without current pathological fracture: Secondary | ICD-10-CM | POA: Insufficient documentation

## 2013-12-09 DIAGNOSIS — Z8719 Personal history of other diseases of the digestive system: Secondary | ICD-10-CM | POA: Insufficient documentation

## 2013-12-09 DIAGNOSIS — I129 Hypertensive chronic kidney disease with stage 1 through stage 4 chronic kidney disease, or unspecified chronic kidney disease: Secondary | ICD-10-CM | POA: Insufficient documentation

## 2013-12-09 DIAGNOSIS — I5022 Chronic systolic (congestive) heart failure: Secondary | ICD-10-CM | POA: Insufficient documentation

## 2013-12-09 DIAGNOSIS — N189 Chronic kidney disease, unspecified: Secondary | ICD-10-CM | POA: Insufficient documentation

## 2013-12-09 DIAGNOSIS — E119 Type 2 diabetes mellitus without complications: Secondary | ICD-10-CM | POA: Insufficient documentation

## 2013-12-09 DIAGNOSIS — Z954 Presence of other heart-valve replacement: Secondary | ICD-10-CM | POA: Insufficient documentation

## 2013-12-09 DIAGNOSIS — J4489 Other specified chronic obstructive pulmonary disease: Secondary | ICD-10-CM | POA: Insufficient documentation

## 2013-12-09 DIAGNOSIS — Z87891 Personal history of nicotine dependence: Secondary | ICD-10-CM | POA: Insufficient documentation

## 2013-12-09 DIAGNOSIS — M6282 Rhabdomyolysis: Secondary | ICD-10-CM | POA: Insufficient documentation

## 2013-12-09 DIAGNOSIS — Z79899 Other long term (current) drug therapy: Secondary | ICD-10-CM | POA: Insufficient documentation

## 2013-12-09 DIAGNOSIS — Z7982 Long term (current) use of aspirin: Secondary | ICD-10-CM | POA: Insufficient documentation

## 2013-12-09 LAB — BASIC METABOLIC PANEL
ANION GAP: 12 (ref 5–15)
BUN: 27 mg/dL — AB (ref 6–23)
CALCIUM: 9.8 mg/dL (ref 8.4–10.5)
CHLORIDE: 96 meq/L (ref 96–112)
CO2: 31 mEq/L (ref 19–32)
CREATININE: 0.8 mg/dL (ref 0.50–1.10)
GFR, EST AFRICAN AMERICAN: 84 mL/min — AB (ref >90)
GFR, EST NON AFRICAN AMERICAN: 72 mL/min — AB (ref >90)
Glucose, Bld: 141 mg/dL — ABNORMAL HIGH (ref 70–99)
Potassium: 4.6 mEq/L (ref 3.7–5.3)
Sodium: 139 mEq/L (ref 137–147)

## 2013-12-09 LAB — CBC WITH DIFFERENTIAL/PLATELET
BASOS ABS: 0 10*3/uL (ref 0.0–0.1)
BASOS PCT: 0 % (ref 0–1)
EOS ABS: 0.2 10*3/uL (ref 0.0–0.7)
EOS PCT: 3 % (ref 0–5)
HEMATOCRIT: 42.7 % (ref 36.0–46.0)
HEMOGLOBIN: 13.5 g/dL (ref 12.0–15.0)
Lymphocytes Relative: 22 % (ref 12–46)
Lymphs Abs: 1.5 10*3/uL (ref 0.7–4.0)
MCH: 27 pg (ref 26.0–34.0)
MCHC: 31.6 g/dL (ref 30.0–36.0)
MCV: 85.4 fL (ref 78.0–100.0)
MONO ABS: 0.8 10*3/uL (ref 0.1–1.0)
MONOS PCT: 11 % (ref 3–12)
NEUTROS ABS: 4.4 10*3/uL (ref 1.7–7.7)
Neutrophils Relative %: 64 % (ref 43–77)
Platelets: 224 10*3/uL (ref 150–400)
RBC: 5 MIL/uL (ref 3.87–5.11)
RDW: 16 % — AB (ref 11.5–15.5)
WBC: 6.9 10*3/uL (ref 4.0–10.5)

## 2013-12-09 LAB — URINALYSIS, ROUTINE W REFLEX MICROSCOPIC
BILIRUBIN URINE: NEGATIVE
Glucose, UA: NEGATIVE mg/dL
HGB URINE DIPSTICK: NEGATIVE
KETONES UR: NEGATIVE mg/dL
Leukocytes, UA: NEGATIVE
NITRITE: NEGATIVE
Protein, ur: NEGATIVE mg/dL
SPECIFIC GRAVITY, URINE: 1.01 (ref 1.005–1.030)
UROBILINOGEN UA: 0.2 mg/dL (ref 0.0–1.0)
pH: 5.5 (ref 5.0–8.0)

## 2013-12-09 NOTE — ED Provider Notes (Signed)
CSN: [id number]     Arrival date & time 12/09/13  5456 History  This chart was scribed for Hurman Horn, MD by Leona Carry, ED Scribe. The patient was seen in APA03/APA03. The patient's care was started at 8:12 AM.   Chief Complaint  Patient presents with  . Back Pain   Patient is a 73 y.o. female presenting with back pain. The history is provided by the patient. No language interpreter was used.  Back Pain  HPI Comments: Kathryn Morgan is a 73 y.o. female with a history of Type 2 DM and pancreatitis who presents to the Emergency Department complaining of constant, mild to moderately severe back pain beginning approximately one week ago. She reports that several days ago, the pain was distributed across her entire low back. She states that the pain then resolved for a couple of days, but returned three days ago. She reports that the pain is exacerbated by change of position. Patient states that the pain does not radiate to her upper back, abdomen, or legs. Patient has taken ibuprofen with some relief of pain. She denies weakness or numbness in legs, burning with urination, rash, recent trauma, fever, bower or bladder changes, vaginal discharge Patient denies history of cancer or neurosurgery.   PCP is Dr. Regino Schultze.   Past Medical History  Diagnosis Date  . Type 2 diabetes mellitus     Insulin pump  . Essential hypertension, benign   . Dyslipidemia   . Depression   . Necrotizing pancreatitis   . Neuropathy   . Rhabdomyolysis   . Gallstone pancreatitis     2005  . Chronic systolic heart failure     LVEF 35%  . Aortic stenosis     Status post 67mm [facility] Ease pericardial tissue valve  . Right bundle-branch block   . Rheumatic fever   . Osteoporosis   . GERD (gastroesophageal reflux disease)   . COPD (chronic obstructive pulmonary disease)   . Chronic kidney disease    Past Surgical History  Procedure Laterality Date  . Esophagogastroduodenoscopy  04/21/04    Normal  esophagus/normal D1,D2; attempted  transgastric drainage/stent placement of pancreatic pseudocyst, no obvious location for gastrotomy, therefore transferred to [facility]  . Debridement pancreas    . Pancreatic pseudocyst drainage    . Cholecystectomy    . Aortic valve replacement  08/27/2011    Procedure: AORTIC VALVE REPLACEMENT (AVR);  Surgeon: Purcell Nails, MD;  Location: [facility];  Service: Open Heart Surgery;  Laterality: N/A;   Family History  Problem Relation Age of Onset  . Colon cancer Neg Hx   . Anesthesia problems Neg Hx   . Heart disease      CHF  . Heart attack Father    History  Substance Use Topics  . Smoking status: Former Smoker -- 3.00 packs/day for 15 years    Types: Cigarettes    Quit date: 06/09/1981  . Smokeless tobacco: Not on file     Comment: quit about 20+ yrs ago  . Alcohol Use: No   OB History   Grav Para Term Preterm Abortions TAB SAB Ect Mult Living                 Review of Systems  Musculoskeletal: Positive for back pain.   10 Systems reviewed and are negative for acute change except as noted in the HPI.    Allergies  Codeine; Promethazine hcl; Ace inhibitors; Adhesive; and Levofloxacin  Home Medications  Prior to Admission medications   Medication Sig Start Date End Date Taking? Authorizing Provider  albuterol (PROVENTIL HFA;VENTOLIN HFA) 108 (90 BASE) MCG/ACT inhaler Inhale 2 puffs into the lungs every 6 (six) hours as needed for wheezing or shortness of breath.   Yes Historical Provider, MD  aspirin EC 325 MG tablet Take 325 mg by mouth every morning.    Yes Historical Provider, MD  Calcium-Magnesium-Vitamin D (CALCIUM 1200+D3 PO) Take 1 tablet by mouth 2 (two) times daily.   Yes Historical Provider, MD  Canagliflozin (INVOKANA) 100 MG TABS Take 100 mg by mouth every morning.   Yes Historical Provider, MD  carvedilol (COREG) 12.5 MG tablet Take 1 tablet (12.5 mg total) by mouth 2 (two) times daily with a meal. 10/19/13 11/01/14 Yes Jonelle Sidle, MD  docusate sodium (COLACE) 100 MG capsule Take 100 mg by mouth 2 (two) times daily.     Yes Historical Provider, MD  fish oil-omega-3 fatty acids 1000 MG capsule Take 1 g by mouth 2 (two) times daily.    Yes Historical Provider, MD  Fluticasone-Salmeterol (ADVAIR DISKUS) 250-50 MCG/DOSE AEPB Inhale 1 puff into the lungs every 12 (twelve) hours.     Yes Historical Provider, MD  furosemide (LASIX) 40 MG tablet Take 1 tablet (40 mg total) by mouth every morning. 10/19/13  Yes Jonelle Sidle, MD  insulin detemir (LEVEMIR) 100 UNIT/ML injection Inject 60 Units into the skin at bedtime.    Yes Historical Provider, MD  Insulin Lispro, Human, (HUMALOG KWIKPEN ) Inject 10-15 Units into the skin 3 (three) times daily with meals. Per sliding scale   Yes Historical Provider, MD  losartan (COZAAR) 25 MG tablet Take 1 tablet (25 mg total) by mouth daily. 10/19/13  Yes Jonelle Sidle, MD  Magnesium 400 MG CAPS Take 1 capsule by mouth daily.   Yes Historical Provider, MD  Probiotic Product (PROBIOTIC FORMULA PO) Take 1 capsule by mouth every morning.    Yes Historical Provider, MD  simvastatin (ZOCOR) 20 MG tablet Take 20 mg by mouth at bedtime.     Yes Historical Provider, MD  spironolactone (ALDACTONE) 25 MG tablet Take 0.5 tablets (12.5 mg total) by mouth daily. 10/19/13  Yes Jonelle Sidle, MD  tiotropium (SPIRIVA) 18 MCG inhalation capsule Place 18 mcg into inhaler and inhale every morning.    Yes Historical Provider, MD   Triage Vitals: BP 146/67  Pulse 91  Temp(Src) 97.8 F (36.6 C) (Oral)  Resp 18  Ht 5\' 4"  (1.626 m)  Wt 207 lb (93.895 kg)  BMI 35.51 kg/m2  SpO2 95% Physical Exam  Nursing note and vitals reviewed. Constitutional:  Awake, alert, nontoxic appearance with baseline speech.  HENT:  Head: Atraumatic.  Eyes: Pupils are equal, round, and reactive to light. Right eye exhibits no discharge. Left eye exhibits no discharge.  Neck: Neck supple.  Cardiovascular: Normal  rate and regular rhythm.   No murmur heard. Pulmonary/Chest: Effort normal and breath sounds normal. No respiratory distress. She has no wheezes. She has no rales. She exhibits no tenderness.  Abdominal: Soft. Bowel sounds are normal. She exhibits no mass. There is no tenderness. There is no rebound.  Musculoskeletal:       Thoracic back: She exhibits no tenderness.       Lumbar back: She exhibits no tenderness.  Bilateral lower extremities non tender without new rashes or color change, baseline ROM with intact DP pulses, CR<2 secs all digits bilaterally, sensation baseline light touch  bilaterally for pt, DTR's symmetric and intact bilaterally KJ / AJ, motor symmetric bilateral 5 / 5 hip flexion, quadriceps, hamstrings, EHL, foot dorsiflexion, foot plantarflexion, gait somewhat antalgic but without apparent new ataxia.  Back non-tender, without rash.   Neurological:  Mental status baseline for patient.  Upper extremity motor strength and sensation intact and symmetric bilaterally.  Skin: No rash noted.  Psychiatric: She has a normal mood and affect.    ED Course  Procedures (including critical care time) DIAGNOSTIC STUDIES: Oxygen Saturation is 95% on room air, normal by my interpretation.    COORDINATION OF CARE: 8:29 AM - Patient / Family / Caregiver understand and agree with initial ED impression and plan with expectations set for ED visit.    Labs Review Labs Reviewed  CBC WITH DIFFERENTIAL - Abnormal; Notable for the following:    RDW 16.0 (*)    All other components within normal limits  BASIC METABOLIC PANEL - Abnormal; Notable for the following:    Glucose, Bld 141 (*)    BUN 27 (*)    GFR calc non Af Amer 72 (*)    GFR calc Af Amer 84 (*)    All other components within normal limits  URINALYSIS, ROUTINE W REFLEX MICROSCOPIC - Abnormal; Notable for the following:    APPearance HAZY (*)    All other components within normal limits    Imaging Review Dg Lumbar Spine  Complete  12/09/2013   CLINICAL DATA:  Low back pain  EXAM: LUMBAR SPINE - COMPLETE 4+ VIEW  COMPARISON:  None.  FINDINGS: Vertebral body height is well maintained. No specific disc abnormality is noted. Diffuse aortic calcifications are seen. Mild facet hypertrophic changes are noted.  IMPRESSION: Mild degenerative change without acute abnormality.   Electronically Signed   By: Alcide CleverMark  Lukens M.D.   On: 12/09/2013 09:06     EKG Interpretation None      MDM   Final diagnoses:  Low back pain without sciatica, unspecified back pain laterality    I doubt any other EMC precluding discharge at this time including, but not necessarily limited to the following:AAA, SBI, cauda equina.  I personally performed the services described in this documentation, which was scribed in my presence. The recorded information has been reviewed and is accurate.    Hurman HornJohn M Levy Cedano, MD 12/10/13 [phone]

## 2013-12-09 NOTE — ED Notes (Signed)
Went to check on patient In Radiology. Offered family member coffee or soda. He declined

## 2013-12-09 NOTE — Discharge Instructions (Signed)
SEEK IMMEDIATE MEDICAL ATTENTION IF: New numbness, tingling, weakness, or problem with the use of your arms or legs.  Severe back pain not relieved with medications.  Change in bowel or bladder control.  Increasing pain in any areas of the body (such as chest or abdominal pain).  Shortness of breath, dizziness or fainting.  Nausea (feeling sick to your stomach), vomiting, fever, or sweats.  

## 2013-12-09 NOTE — ED Notes (Signed)
Pt c/o lower left back pain starting one week ago. Pt denies fall/trauma. Pt reports urine "more yellow than usual" but denies pain/burning urine.

## 2013-12-17 ENCOUNTER — Other Ambulatory Visit: Payer: Self-pay

## 2013-12-17 DIAGNOSIS — Z1231 Encounter for screening mammogram for malignant neoplasm of breast: Secondary | ICD-10-CM

## 2014-01-01 ENCOUNTER — Ambulatory Visit
Admission: RE | Admit: 2014-01-01 | Discharge: 2014-01-01 | Disposition: A | Discharge status: Home / Self Care | Payer: Medicare Other | Source: Ambulatory Visit

## 2014-01-01 DIAGNOSIS — Z1231 Encounter for screening mammogram for malignant neoplasm of breast: Secondary | ICD-10-CM

## 2014-03-01 IMAGING — CR DG CHEST 2V
2 series · 2 of 2 positions shown · non-contrast
Comparison: January 07, 2012.

CLINICAL DATA: Chronic airway obstruction.

CHEST - 2 VIEW

[view not recorded (1 of 2)]
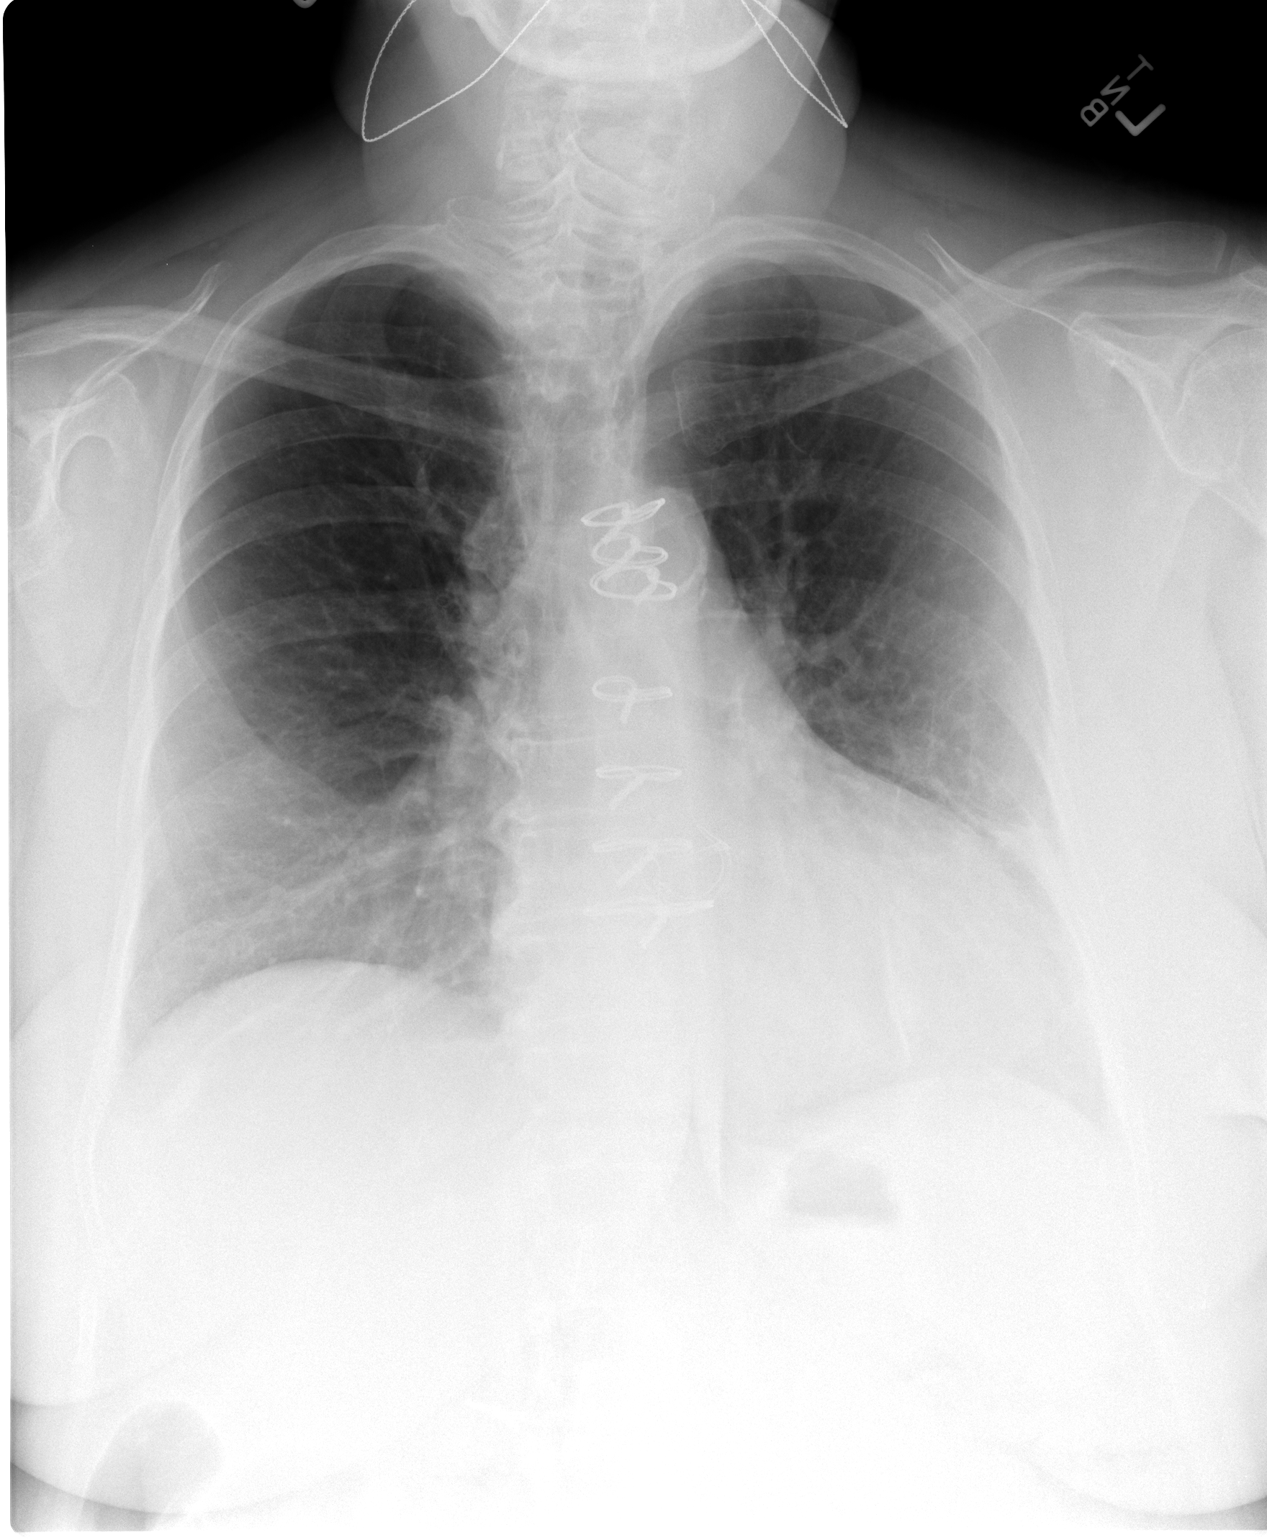

[view not recorded (2 of 2)]
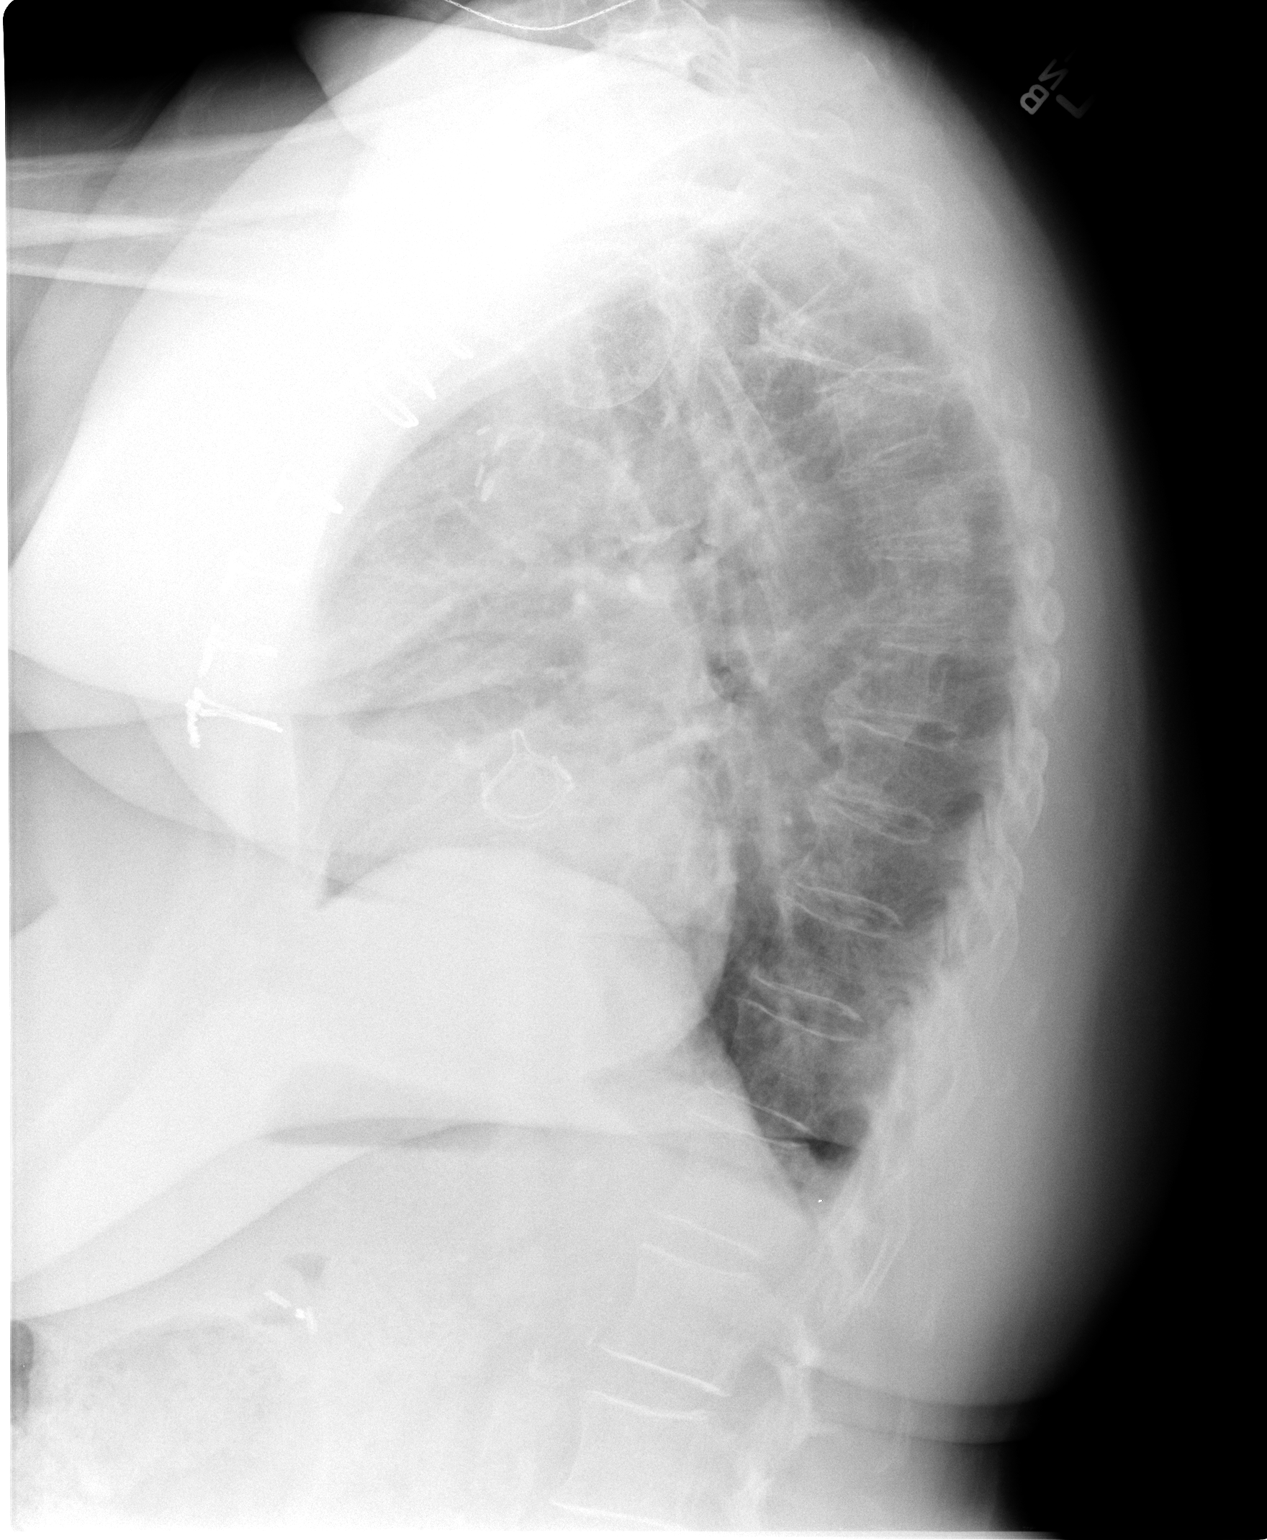

[2 of 2 positions shown; findings below may reference images not displayed]

FINDINGS: Stable mild cardiomegaly.  Sternotomy wires are noted.
No pleural effusion or pneumothorax is noted.  Status post aortic
valve replacement.  No acute pulmonary disease is noted.
IMPRESSION: No acute cardiopulmonary abnormality seen.

## 2014-05-15 ENCOUNTER — Encounter (HOSPITAL_COMMUNITY): Payer: Self-pay | Admitting: Cardiovascular Disease

## 2014-05-22 ENCOUNTER — Ambulatory Visit: Payer: Medicare Other | Admitting: Cardiology

## 2014-05-23 ENCOUNTER — Ambulatory Visit (INDEPENDENT_AMBULATORY_CARE_PROVIDER_SITE_OTHER): Payer: Medicare Other | Admitting: Cardiology

## 2014-05-23 ENCOUNTER — Encounter: Payer: Self-pay | Admitting: Cardiology

## 2014-05-23 VITALS — BP 106/70 | HR 89 | Ht 64.0 in | Wt 220.0 lb

## 2014-05-23 DIAGNOSIS — I10 Essential (primary) hypertension: Secondary | ICD-10-CM

## 2014-05-23 DIAGNOSIS — I350 Nonrheumatic aortic (valve) stenosis: Secondary | ICD-10-CM

## 2014-05-23 DIAGNOSIS — I429 Cardiomyopathy, unspecified: Secondary | ICD-10-CM

## 2014-05-23 NOTE — Assessment & Plan Note (Signed)
Blood pressure is well controlled today. No changes made. 

## 2014-05-23 NOTE — Progress Notes (Signed)
Reason for visit: Aortic valve disease, cardiomyopathy  Clinical Summary Ms. Garbers is a 73 y.o.female last seen in May. She presents for a routine visit. States that she has not been exercising regularly, has gained weight. She is following with Dr. Margo Aye now for primary care and management of her diabetes. She reports NYHA class II dyspnea at baseline, no chest pain or palpitations. She has joined the [employer] and plans to start back on a regimen of exercise around the first of the year.  We reviewed her medications, she continued on aspirin, Coreg, Lasix, Cozaar, Aldactone, and Zocor. She states that lipids have been well controlled overall. No obvious medication intolerances.  Echocardiogram in December 2013 showed improved LVEF to the range of 50-55%, stable aortic bioprosthesis with normal function.  Allergies  Allergen Reactions  . Codeine Itching  . Promethazine Hcl Anxiety and Other (See Comments)    Pt and husband state severe hallucinations and paranoia from phenegran  . Ace Inhibitors Cough  . Adhesive [Tape] Other (See Comments)    Skin tearing  . Levofloxacin Itching and Other (See Comments)    Patient unsure of exact reaction    Current Outpatient Prescriptions  Medication Sig Dispense Refill  . albuterol (PROVENTIL HFA;VENTOLIN HFA) 108 (90 BASE) MCG/ACT inhaler Inhale 2 puffs into the lungs every 6 (six) hours as needed for wheezing or shortness of breath.    Marland Kitchen aspirin EC 325 MG tablet Take 325 mg by mouth every morning.     . Canagliflozin (INVOKANA) 100 MG TABS Take 100 mg by mouth every morning.    . carvedilol (COREG) 12.5 MG tablet Take 1 tablet (12.5 mg total) by mouth 2 (two) times daily with a meal. 180 tablet 3  . co-enzyme Q-10 30 MG capsule Take 30 mg by mouth daily.    Marland Kitchen docusate sodium (COLACE) 100 MG capsule Take 100 mg by mouth 2 (two) times daily.      . fish oil-omega-3 fatty acids 1000 MG capsule Take 1 g by mouth 2 (two) times daily.     .  Fluticasone-Salmeterol (ADVAIR DISKUS) 250-50 MCG/DOSE AEPB Inhale 1 puff into the lungs every 12 (twelve) hours.      . furosemide (LASIX) 40 MG tablet Take 1 tablet (40 mg total) by mouth every morning. 90 tablet 3  . Insulin Lispro, Human, (HUMALOG KWIKPEN [place]) Inject 10-15 Units into the skin 3 (three) times daily with meals. Per sliding scale    . losartan (COZAAR) 25 MG tablet Take 1 tablet (25 mg total) by mouth daily. 90 tablet 3  . Magnesium 400 MG CAPS Take 1 capsule by mouth daily.    . Probiotic Product (PROBIOTIC FORMULA PO) Take 1 capsule by mouth every morning.     . simvastatin (ZOCOR) 20 MG tablet Take 20 mg by mouth at bedtime.      Marland Kitchen spironolactone (ALDACTONE) 25 MG tablet Take 0.5 tablets (12.5 mg total) by mouth daily. 45 tablet 3  . tiotropium (SPIRIVA) 18 MCG inhalation capsule Place 18 mcg into inhaler and inhale every morning.      No current facility-administered medications for this visit.    Past Medical History  Diagnosis Date  . Type 2 diabetes mellitus     Insulin pump  . Essential hypertension, benign   . Dyslipidemia   . Depression   . Necrotizing pancreatitis   . Neuropathy   . Rhabdomyolysis   . Gallstone pancreatitis     2005  . Chronic systolic  heart failure     LVEF 35%  . Aortic stenosis     Status post 23mm [facility] Ease pericardial tissue valve  . Right bundle-branch block   . Rheumatic fever   . Osteoporosis   . GERD (gastroesophageal reflux disease)   . COPD (chronic obstructive pulmonary disease)   . Chronic kidney disease     Past Surgical History  Procedure Laterality Date  . Esophagogastroduodenoscopy  04/21/04    Normal esophagus/normal D1,D2; attempted  transgastric drainage/stent placement of pancreatic pseudocyst, no obvious location for gastrotomy, [facility]  . Debridement pancreas    . Pancreatic pseudocyst drainage    . Cholecystectomy    . Aortic valve replacement  08/27/2011    Procedure:  AORTIC VALVE REPLACEMENT (AVR);  Surgeon: Purcell Nailslarence H Owen, MD;  [facility];  Service: Open Heart Surgery;  Laterality: N/A;  . Left and right heart catheterization with coronary angiogram N/A 06/03/2011    Procedure: LEFT AND RIGHT HEART CATHETERIZATION WITH CORONARY ANGIOGRAM;  Surgeon: Kathleene Hazelhristopher D [name], MD;  [facility];  Service: Cardiovascular;  Laterality: N/A;  Right Heart Cath also    Social History Ms. Sibyl ParrChapman reports that she quit smoking about 32 years ago. Her smoking use included Cigarettes. She has a 45 pack-year smoking history. She does not have any smokeless tobacco history on file. Ms. Sibyl ParrChapman reports that she does not drink alcohol.  Review of Systems Complete review of systems negative except as otherwise outlined in the clinical summary and also the following. No orthopnea or PND. No claudication.  Physical Examination Filed Vitals:   05/23/14 1341  BP: 106/70  Pulse: 89   Filed Weights   05/23/14 1341  Weight: 220 lb (99.791 kg)     No acute distress.  HEENT: Conjunctiva and lids normal, oropharynx clear with moist mucosa.  Neck: Supple, no elevated JVP, no thyromegaly.  Lungs: Clear to auscultation, nonlabored breathing at rest.  Cardiac: Regular rate and rhythm, no S3, 2/6 systolic murmur, no pericardial rub.  Abdomen: Soft, nontender, bowel sounds present, no guarding or rebound.  Extremities: No pitting edema, distal pulses 2+.  Skin: Warm and dry.  Musculoskeletal: No kyphosis.  Neuropsychiatric: Alert and oriented x3, affect grossly appropriate.   Problem List and Plan   AS (aortic stenosis) Status post pericardial AVR in 2013, symptomatically stable in terms of symptoms and examination. No clear indication for follow-up echocardiogram at this time. Continue observation.  Essential hypertension, benign Blood pressure is well controlled today. No changes made.  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 No active heart failure symptoms.    Jonelle SidleSamuel G. Jemima Petko, M.D., F.A.C.C.

## 2014-05-23 NOTE — Assessment & Plan Note (Signed)
Status post pericardial AVR in 2013, symptomatically stable in terms of symptoms and examination. No clear indication for follow-up echocardiogram at this time. Continue observation.

## 2014-05-23 NOTE — Patient Instructions (Signed)
Your physician wants you to follow-up in: 6 Months. You will receive a reminder letter in the mail two months in advance. If you don't receive a letter, please call our office to schedule the follow-up appointment.  Your physician recommends that you continue on your current medications as directed. Please refer to the Current Medication list given to you today.  Thank you for choosing [facility]!   

## 2014-05-23 NOTE — Assessment & Plan Note (Signed)
LVEF 50-55% by most recent assessment. No active heart failure symptoms.

## 2014-08-29 ENCOUNTER — Other Ambulatory Visit: Payer: Self-pay | Admitting: Cardiology

## 2014-11-24 ENCOUNTER — Emergency Department (HOSPITAL_COMMUNITY): Payer: Medicare Other

## 2014-11-24 ENCOUNTER — Encounter (HOSPITAL_COMMUNITY): Payer: Self-pay | Admitting: *Deleted

## 2014-11-24 ENCOUNTER — Inpatient Hospital Stay (HOSPITAL_COMMUNITY)
Admission: EM | Admit: 2014-11-24 | Discharge: 2014-11-25 | DRG: 155 | Disposition: A | Discharge status: Home / Self Care | Payer: Medicare Other | Attending: Internal Medicine | Admitting: Internal Medicine

## 2014-11-24 DIAGNOSIS — R221 Localized swelling, mass and lump, neck: Secondary | ICD-10-CM | POA: Diagnosis not present

## 2014-11-24 DIAGNOSIS — N189 Chronic kidney disease, unspecified: Secondary | ICD-10-CM | POA: Diagnosis present

## 2014-11-24 DIAGNOSIS — I429 Cardiomyopathy, unspecified: Secondary | ICD-10-CM

## 2014-11-24 DIAGNOSIS — Z8249 Family history of ischemic heart disease and other diseases of the circulatory system: Secondary | ICD-10-CM

## 2014-11-24 DIAGNOSIS — I10 Essential (primary) hypertension: Secondary | ICD-10-CM | POA: Diagnosis present

## 2014-11-24 DIAGNOSIS — J449 Chronic obstructive pulmonary disease, unspecified: Secondary | ICD-10-CM | POA: Diagnosis present

## 2014-11-24 DIAGNOSIS — K219 Gastro-esophageal reflux disease without esophagitis: Secondary | ICD-10-CM | POA: Diagnosis present

## 2014-11-24 DIAGNOSIS — I5022 Chronic systolic (congestive) heart failure: Secondary | ICD-10-CM | POA: Diagnosis present

## 2014-11-24 DIAGNOSIS — E785 Hyperlipidemia, unspecified: Secondary | ICD-10-CM | POA: Diagnosis present

## 2014-11-24 DIAGNOSIS — Z87891 Personal history of nicotine dependence: Secondary | ICD-10-CM

## 2014-11-24 DIAGNOSIS — Z7982 Long term (current) use of aspirin: Secondary | ICD-10-CM

## 2014-11-24 DIAGNOSIS — K113 Abscess of salivary gland: Secondary | ICD-10-CM | POA: Diagnosis present

## 2014-11-24 DIAGNOSIS — E119 Type 2 diabetes mellitus without complications: Secondary | ICD-10-CM

## 2014-11-24 DIAGNOSIS — Z794 Long term (current) use of insulin: Secondary | ICD-10-CM

## 2014-11-24 DIAGNOSIS — I129 Hypertensive chronic kidney disease with stage 1 through stage 4 chronic kidney disease, or unspecified chronic kidney disease: Secondary | ICD-10-CM | POA: Diagnosis present

## 2014-11-24 DIAGNOSIS — K1120 Sialoadenitis, unspecified: Principal | ICD-10-CM | POA: Diagnosis present

## 2014-11-24 DIAGNOSIS — M810 Age-related osteoporosis without current pathological fracture: Secondary | ICD-10-CM | POA: Diagnosis present

## 2014-11-24 DIAGNOSIS — R1310 Dysphagia, unspecified: Secondary | ICD-10-CM | POA: Diagnosis present

## 2014-11-24 DIAGNOSIS — Z952 Presence of prosthetic heart valve: Secondary | ICD-10-CM

## 2014-11-24 LAB — CBC WITH DIFFERENTIAL/PLATELET
BASOS ABS: 0.1 10*3/uL (ref 0.0–0.1)
BASOS PCT: 1 % (ref 0–1)
EOS ABS: 0.3 10*3/uL (ref 0.0–0.7)
Eosinophils Relative: 3 % (ref 0–5)
HCT: 38.2 % (ref 36.0–46.0)
Hemoglobin: 12.3 g/dL (ref 12.0–15.0)
Lymphocytes Relative: 20 % (ref 12–46)
Lymphs Abs: 2.3 10*3/uL (ref 0.7–4.0)
MCH: 28.3 pg (ref 26.0–34.0)
MCHC: 32.2 g/dL (ref 30.0–36.0)
MCV: 88 fL (ref 78.0–100.0)
MONO ABS: 0.8 10*3/uL (ref 0.1–1.0)
Monocytes Relative: 7 % (ref 3–12)
NEUTROS PCT: 69 % (ref 43–77)
Neutro Abs: 7.6 10*3/uL (ref 1.7–7.7)
Platelets: 193 10*3/uL (ref 150–400)
RBC: 4.34 MIL/uL (ref 3.87–5.11)
RDW: 16.5 % — AB (ref 11.5–15.5)
WBC: 11 10*3/uL — AB (ref 4.0–10.5)

## 2014-11-24 MED ORDER — FENTANYL CITRATE (PF) 100 MCG/2ML IJ SOLN
25.0000 ug | Freq: Once | INTRAMUSCULAR | Status: AC
  Administered 2014-11-25: 25 ug via INTRAVENOUS
  Filled 2014-11-24: qty 2

## 2014-11-24 NOTE — ED Notes (Signed)
Dr Knapp at bedside,  

## 2014-11-24 NOTE — ED Notes (Signed)
Pt c/o swelling to neck area and ? Growth under tongue area that started Friday, painful to swallow and eat,

## 2014-11-24 NOTE — ED Provider Notes (Signed)
CSN: [id number]     Arrival date & time 11/24/14  2225 History  This chart was scribed for Devoria Albe, MD by Octavia Heir, ED Scribe. This patient was seen in room APA09/APA09 and the patient's care was started at 11:14 PM.    Chief Complaint  Patient presents with  . Facial Swelling      The history is provided by the patient. No language interpreter was used.    HPI Comments: Kathryn Morgan is a 74 y.o. female who has a hx of COPD and DM presents to the Emergency Department complaining of facial swelling onset 3 days ago. Pt noticed gland swelling and pain in her neck and has associated pain in her mouth including her tongue. Pt has dentures and notes she has a feeling of a toothache in her gums and has removed her dentures. She also has some swelling under her tongue. She is able to swallow but notes it is painful in her mouth and neck. She has prior glad swelling but notes never this bad. Pt states when she eats, her glands get bigger and then swells back down. She has chronic SOB because of her COPD and states her SOB is a little more than her baseline. Pt denies fevers, nausea, and vomiting. Pt is a former smoker but quit many years ago. She denies chewing tobacco.  PCP Dr Margo Aye Cardiology Dr Diona Browner  Past Medical History  Diagnosis Date  . Type 2 diabetes mellitus     Insulin pump  . Essential hypertension, benign   . Dyslipidemia   . Depression   . Necrotizing pancreatitis   . Neuropathy   . Rhabdomyolysis   . Gallstone pancreatitis     2005  . Chronic systolic heart failure     LVEF 35%  . Aortic stenosis     Status post 23mm [facility] Ease pericardial tissue valve  . Right bundle-branch block   . Rheumatic fever   . Osteoporosis   . GERD (gastroesophageal reflux disease)   . COPD (chronic obstructive pulmonary disease)   . Chronic kidney disease    Past Surgical History  Procedure Laterality Date  . Esophagogastroduodenoscopy  04/21/04    Normal  esophagus/normal D1,D2; attempted  transgastric drainage/stent placement of pancreatic pseudocyst, no obvious location for gastrotomy, therefore transferred to [facility]  . Debridement pancreas    . Pancreatic pseudocyst drainage    . Cholecystectomy    . Aortic valve replacement  08/27/2011    Procedure: AORTIC VALVE REPLACEMENT (AVR);  Surgeon: Purcell Nails, MD;  Location: [facility] OR;  Service: Open Heart Surgery;  Laterality: N/A;  . Left and right heart catheterization with coronary angiogram N/A 06/03/2011    Procedure: LEFT AND RIGHT HEART CATHETERIZATION WITH CORONARY ANGIOGRAM;  Surgeon: Kathleene Hazel, MD;  Location: [facility] CATH LAB;  Service: Cardiovascular;  Laterality: N/A;  Right Heart Cath also   Family History  Problem Relation Age of Onset  . Colon cancer Neg Hx   . Anesthesia problems Neg Hx   . Heart disease      CHF  . Heart attack Father    History  Substance Use Topics  . Smoking status: Former Smoker -- 3.00 packs/day for 15 years    Types: Cigarettes    Quit date: 06/09/1981  . Smokeless tobacco: Not on file     Comment: quit about 20+ yrs ago  . Alcohol Use: No   Lives at home Lives with spouse  OB History  No data available     Review of Systems  Constitutional: Negative for fever.  HENT: Positive for dental problem and facial swelling.   Respiratory: Positive for shortness of breath.   Gastrointestinal: Negative for nausea and vomiting.  All other systems reviewed and are negative.     Allergies  Codeine; Promethazine hcl; Ace inhibitors; Adhesive; and Levofloxacin  Home Medications   Prior to Admission medications   Medication Sig Start Date End Date Taking? Authorizing Provider  albuterol (PROVENTIL HFA;VENTOLIN HFA) 108 (90 BASE) MCG/ACT inhaler Inhale 2 puffs into the lungs every 6 (six) hours as needed for wheezing or shortness of breath.    Historical Provider, MD  aspirin EC 325 MG tablet Take 325 mg by mouth every morning.      Historical Provider, MD  Canagliflozin (INVOKANA) 100 MG TABS Take 100 mg by mouth every morning.    Historical Provider, MD  carvedilol (COREG) 12.5 MG tablet Take 1 tablet (12.5 mg total) by mouth 2 (two) times daily with a meal. 10/19/13 11/01/14  Jonelle Sidle, MD  co-enzyme Q-10 30 MG capsule Take 30 mg by mouth daily.    Historical Provider, MD  docusate sodium (COLACE) 100 MG capsule Take 100 mg by mouth 2 (two) times daily.      Historical Provider, MD  fish oil-omega-3 fatty acids 1000 MG capsule Take 1 g by mouth 2 (two) times daily.     Historical Provider, MD  Fluticasone-Salmeterol (ADVAIR DISKUS) 250-50 MCG/DOSE AEPB Inhale 1 puff into the lungs every 12 (twelve) hours.      Historical Provider, MD  furosemide (LASIX) 40 MG tablet Take 1 tablet by mouth  every morning 08/29/14   Jonelle Sidle, MD  Insulin Lispro, Human, (HUMALOG KWIKPEN [place]) Inject 10-15 Units into the skin 3 (three) times daily with meals. Per sliding scale    Historical Provider, MD  losartan (COZAAR) 25 MG tablet Take 1 tablet (25 mg total) by mouth daily. 10/19/13   Jonelle Sidle, MD  Magnesium 400 MG CAPS Take 1 capsule by mouth daily.    Historical Provider, MD  Probiotic Product (PROBIOTIC FORMULA PO) Take 1 capsule by mouth every morning.     Historical Provider, MD  simvastatin (ZOCOR) 20 MG tablet Take 20 mg by mouth at bedtime.      Historical Provider, MD  spironolactone (ALDACTONE) 25 MG tablet Take 0.5 tablets (12.5 mg total) by mouth daily. 10/19/13   Jonelle Sidle, MD  tiotropium (SPIRIVA) 18 MCG inhalation capsule Place 18 mcg into inhaler and inhale every morning.     Historical Provider, MD   Triage vitals: BP 141/62 mmHg  Pulse 92  Temp(Src) 98.6 F (37 C) (Oral)  Resp 20  Ht 5' 3.5" (1.613 m)  Wt 216 lb (97.977 kg)  BMI 37.66 kg/m2  SpO2 93%  Vital signs normal   Physical Exam  Constitutional: She is oriented to person, place, and time. She appears well-developed and  well-nourished.  Non-toxic appearance. She does not appear ill. No distress.  HENT:  Head: Normocephalic and atraumatic.  Right Ear: External ear normal.  Left Ear: External ear normal.  Nose: Nose normal. No mucosal edema or rhinorrhea.  Mouth/Throat: Oropharynx is clear and moist and mucous membranes are normal. No dental abscesses or uvula swelling.  Swelling and redness noted underneath tongue, see photo.   Eyes: Conjunctivae and EOM are normal. Pupils are equal, round, and reactive to light.  Neck: Normal range of motion and  full passive range of motion without pain. Neck supple.   She has obvious swelling and redness of whole anterior neck more pronounced on left than right with tender enduration and large mass in subcutaneous tissue on the left.   Cardiovascular: Normal rate, regular rhythm and normal heart sounds.  Exam reveals no gallop and no friction rub.   No murmur heard. Pulmonary/Chest: Effort normal. No accessory muscle usage. No tachypnea. No respiratory distress. She has decreased breath sounds. She has no wheezes. She has no rhonchi. She has no rales. She exhibits no tenderness and no crepitus.  Diminished breath sounds  Abdominal: Soft. Normal appearance and bowel sounds are normal. She exhibits no distension. There is no tenderness. There is no rebound and no guarding.  Musculoskeletal: Normal range of motion. She exhibits no edema or tenderness.  Moves all extremities well.   Neurological: She is alert and oriented to person, place, and time. She has normal strength. No cranial nerve deficit.  Skin: Skin is warm, dry and intact. No rash noted. No erythema. No pallor.  Psychiatric: She has a normal mood and affect. Her speech is normal and behavior is normal. Her mood appears not anxious.  Nursing note and vitals reviewed.       ED Course  Procedures   Medications  clindamycin (CLEOCIN) IVPB 900 mg (900 mg Intravenous New Bag/Given 11/25/14 0136)  fentaNYL  (SUBLIMAZE) injection 25 mcg (25 mcg Intravenous Given 11/25/14 0002)  iohexol (OMNIPAQUE) 300 MG/ML solution 80 mL (75 mLs Intravenous Contrast Given 11/25/14 0043)    DIAGNOSTIC STUDIES: Oxygen Saturation is 93% on RA, low by my interpretation.  COORDINATION OF CARE:  11:19 PM Discussed treatment plan which includes CT scan of neck with pt at bedside and pt agreed to plan.  01:30 patient and family given results of her CT scan. IV antibiotics ordered. Pt is agreeable to admission for more IV antibiotics.   Patient has diabetes with new onset acute renal insufficiency. She has redness of the skin of her neck consistent with possible cellulitis and also infection of the submandibular salivary glands. She also states she has some mild worsening of her shortness of breath although she is not overtly short of breath on exam. She has pain with swallowing. At this point is felt she would benefit from admission.  01:48 Dr Mort Sawyers, admit to obs, med-surg  Labs Review Results for orders placed or performed during the hospital encounter of 11/24/14  Comprehensive metabolic panel  Result Value Ref Range   Sodium 139 135 - 145 mmol/L   Potassium 3.5 3.5 - 5.1 mmol/L   Chloride 95 (L) 101 - 111 mmol/L   CO2 31 22 - 32 mmol/L   Glucose, Bld 130 (H) 65 - 99 mg/dL   BUN 38 (H) 6 - 20 mg/dL   Creatinine, Ser 6.31 (H) 0.44 - 1.00 mg/dL   Calcium 9.0 8.9 - 49.7 mg/dL   Total Protein 7.4 6.5 - 8.1 g/dL   Albumin 3.9 3.5 - 5.0 g/dL   AST 23 15 - 41 U/L   ALT 26 14 - 54 U/L   Alkaline Phosphatase 53 38 - 126 U/L   Total Bilirubin 0.7 0.3 - 1.2 mg/dL   GFR calc non Af Amer 40 (L) >60 mL/min   GFR calc Af Amer 47 (L) >60 mL/min   Anion gap 13 5 - 15  CBC with Differential  Result Value Ref Range   WBC 11.0 (H) 4.0 - 10.5 K/uL  RBC 4.34 3.87 - 5.11 MIL/uL   Hemoglobin 12.3 12.0 - 15.0 g/dL   HCT 40.9 81.1 - 91.4 %   MCV 88.0 78.0 - 100.0 fL   MCH 28.3 26.0 - 34.0 pg   MCHC 32.2 30.0 - 36.0 g/dL    RDW 78.2 (H) 95.6 - 15.5 %   Platelets 193 150 - 400 K/uL   Neutrophils Relative % 69 43 - 77 %   Neutro Abs 7.6 1.7 - 7.7 K/uL   Lymphocytes Relative 20 12 - 46 %   Lymphs Abs 2.3 0.7 - 4.0 K/uL   Monocytes Relative 7 3 - 12 %   Monocytes Absolute 0.8 0.1 - 1.0 K/uL   Eosinophils Relative 3 0 - 5 %   Eosinophils Absolute 0.3 0.0 - 0.7 K/uL   Basophils Relative 1 0 - 1 %   Basophils Absolute 0.1 0.0 - 0.1 K/uL   Laboratory interpretation all normal except leukocytosis, new acute renal insufficiency     Imaging Review Ct Soft Tissue Neck W Contrast  11/25/2014   CLINICAL DATA:  Acute onset of facial swelling. Pain at the neck and mouth. Swelling under the tongue. Initial encounter.  EXAM: CT NECK WITH CONTRAST  TECHNIQUE: Multidetector CT imaging of the neck was performed using the standard protocol following the bolus administration of intravenous contrast.  CONTRAST:  75mL OMNIPAQUE IOHEXOL 300 MG/ML  SOLN  COMPARISON:  CT of the head and cervical spine performed 01/18/2013  FINDINGS: Pharynx and larynx: The nasopharynx, oropharynx and hypopharynx are unremarkable in appearance. The valleculae and piriform sinuses are within normal limits. There is no evidence of abscess. The parapharyngeal fat planes are preserved. The vocal cords are relatively symmetric and unremarkable. The proximal trachea is normal in appearance.  Salivary glands: The parotid glands are relatively symmetric and unremarkable in appearance. There is asymmetric prominence of the left and submandibular glands, with minimal nonspecific stranding about the submandibular glands bilaterally, and mild soft tissue edema is noted at the base of the throat. This may reflect a mild infectious process.  Thyroid: The thyroid gland is unremarkable in appearance.  Lymph nodes: No cervical lymphadenopathy is seen.  Vascular: Calcification is noted at the carotid bifurcations bilaterally, likely with mild to moderate luminal narrowing.  Calcification is noted along the aortic arch and proximal great vessels.  Limited intracranial: The visualized portions of the brain are unremarkable in appearance.  Visualized orbits: The visualized portions of the orbits are grossly unremarkable.  Mastoids and visualized paranasal sinuses: The visualized paranasal sinuses and mastoid air cells are well-aerated.  Skeleton: No acute osseous abnormalities are identified.  Upper chest: The patient is status post median sternotomy. The visualized superior mediastinum is grossly unremarkable. The visualized lung apices are clear.  IMPRESSION: 1. Mild asymmetric prominence of the left submandibular gland, with minimal nonspecific stranding about both submandibular glands. Mild soft tissue edema suggested at the base of the throat. This could reflect a mild infectious process. Would correlate for associated symptoms. 2. Calcification noted at the carotid bifurcations bilaterally, likely with mild to moderate bilateral luminal narrowing. Carotid ultrasound would be helpful for further evaluation, when and as deemed clinically appropriate. 3. Calcification along the aortic arch and proximal great vessels.   Electronically Signed   By: Roanna Raider M.D.   On: 11/25/2014 01:22     EKG Interpretation None      MDM   Final diagnoses:  Salivary gland infections    Plan admission  Devoria Albe, MD,  FACEP   I personally performed the services described in this documentation, which was scribed in my presence. The recorded information has been reviewed and considered.  Devoria Albe, MD, Concha Pyo, MD 11/25/14 [phone]

## 2014-11-25 ENCOUNTER — Encounter (HOSPITAL_COMMUNITY): Payer: Self-pay

## 2014-11-25 DIAGNOSIS — N189 Chronic kidney disease, unspecified: Secondary | ICD-10-CM | POA: Diagnosis present

## 2014-11-25 DIAGNOSIS — E118 Type 2 diabetes mellitus with unspecified complications: Secondary | ICD-10-CM

## 2014-11-25 DIAGNOSIS — R1310 Dysphagia, unspecified: Secondary | ICD-10-CM | POA: Diagnosis present

## 2014-11-25 DIAGNOSIS — K219 Gastro-esophageal reflux disease without esophagitis: Secondary | ICD-10-CM | POA: Diagnosis present

## 2014-11-25 DIAGNOSIS — I429 Cardiomyopathy, unspecified: Secondary | ICD-10-CM | POA: Diagnosis present

## 2014-11-25 DIAGNOSIS — I10 Essential (primary) hypertension: Secondary | ICD-10-CM | POA: Diagnosis not present

## 2014-11-25 DIAGNOSIS — J449 Chronic obstructive pulmonary disease, unspecified: Secondary | ICD-10-CM | POA: Diagnosis present

## 2014-11-25 DIAGNOSIS — K1120 Sialoadenitis, unspecified: Secondary | ICD-10-CM | POA: Diagnosis not present

## 2014-11-25 DIAGNOSIS — R221 Localized swelling, mass and lump, neck: Secondary | ICD-10-CM | POA: Diagnosis present

## 2014-11-25 DIAGNOSIS — K113 Abscess of salivary gland: Secondary | ICD-10-CM | POA: Diagnosis present

## 2014-11-25 DIAGNOSIS — Z87891 Personal history of nicotine dependence: Secondary | ICD-10-CM | POA: Diagnosis not present

## 2014-11-25 DIAGNOSIS — E119 Type 2 diabetes mellitus without complications: Secondary | ICD-10-CM | POA: Diagnosis present

## 2014-11-25 DIAGNOSIS — Z794 Long term (current) use of insulin: Secondary | ICD-10-CM | POA: Diagnosis not present

## 2014-11-25 DIAGNOSIS — Z7982 Long term (current) use of aspirin: Secondary | ICD-10-CM | POA: Diagnosis not present

## 2014-11-25 DIAGNOSIS — Z952 Presence of prosthetic heart valve: Secondary | ICD-10-CM | POA: Diagnosis not present

## 2014-11-25 DIAGNOSIS — I5022 Chronic systolic (congestive) heart failure: Secondary | ICD-10-CM | POA: Diagnosis present

## 2014-11-25 DIAGNOSIS — I129 Hypertensive chronic kidney disease with stage 1 through stage 4 chronic kidney disease, or unspecified chronic kidney disease: Secondary | ICD-10-CM | POA: Diagnosis present

## 2014-11-25 DIAGNOSIS — M810 Age-related osteoporosis without current pathological fracture: Secondary | ICD-10-CM | POA: Diagnosis present

## 2014-11-25 DIAGNOSIS — E785 Hyperlipidemia, unspecified: Secondary | ICD-10-CM | POA: Diagnosis present

## 2014-11-25 DIAGNOSIS — Z8249 Family history of ischemic heart disease and other diseases of the circulatory system: Secondary | ICD-10-CM | POA: Diagnosis not present

## 2014-11-25 LAB — COMPREHENSIVE METABOLIC PANEL
ALBUMIN: 3.9 g/dL (ref 3.5–5.0)
ALT: 26 U/L (ref 14–54)
AST: 23 U/L (ref 15–41)
Alkaline Phosphatase: 53 U/L (ref 38–126)
Anion gap: 13 (ref 5–15)
BUN: 38 mg/dL — AB (ref 6–20)
CALCIUM: 9 mg/dL (ref 8.9–10.3)
CHLORIDE: 95 mmol/L — AB (ref 101–111)
CO2: 31 mmol/L (ref 22–32)
Creatinine, Ser: 1.29 mg/dL — ABNORMAL HIGH (ref 0.44–1.00)
GFR calc non Af Amer: 40 mL/min — ABNORMAL LOW (ref >60)
GFR, EST AFRICAN AMERICAN: 47 mL/min — AB (ref >60)
Glucose, Bld: 130 mg/dL — ABNORMAL HIGH (ref 65–99)
Potassium: 3.5 mmol/L (ref 3.5–5.1)
Sodium: 139 mmol/L (ref 135–145)
TOTAL PROTEIN: 7.4 g/dL (ref 6.5–8.1)
Total Bilirubin: 0.7 mg/dL (ref 0.3–1.2)

## 2014-11-25 LAB — BASIC METABOLIC PANEL
ANION GAP: 11 (ref 5–15)
BUN: 34 mg/dL — ABNORMAL HIGH (ref 6–20)
CO2: 32 mmol/L (ref 22–32)
Calcium: 8.8 mg/dL — ABNORMAL LOW (ref 8.9–10.3)
Chloride: 98 mmol/L — ABNORMAL LOW (ref 101–111)
Creatinine, Ser: 1.12 mg/dL — ABNORMAL HIGH (ref 0.44–1.00)
GFR calc Af Amer: 55 mL/min — ABNORMAL LOW (ref >60)
GFR calc non Af Amer: 48 mL/min — ABNORMAL LOW (ref >60)
Glucose, Bld: 69 mg/dL (ref 65–99)
Potassium: 3.4 mmol/L — ABNORMAL LOW (ref 3.5–5.1)
SODIUM: 141 mmol/L (ref 135–145)

## 2014-11-25 LAB — CBC
HEMATOCRIT: 37 % (ref 36.0–46.0)
Hemoglobin: 11.8 g/dL — ABNORMAL LOW (ref 12.0–15.0)
MCH: 28.1 pg (ref 26.0–34.0)
MCHC: 31.9 g/dL (ref 30.0–36.0)
MCV: 88.1 fL (ref 78.0–100.0)
Platelets: 177 10*3/uL (ref 150–400)
RBC: 4.2 MIL/uL (ref 3.87–5.11)
RDW: 16.4 % — ABNORMAL HIGH (ref 11.5–15.5)
WBC: 8.5 10*3/uL (ref 4.0–10.5)

## 2014-11-25 MED ORDER — INSULIN ASPART 100 UNIT/ML ~~LOC~~ SOLN: 0.0000 [IU] | SUBCUTANEOUS | Status: DC

## 2014-11-25 MED ORDER — CHLORHEXIDINE GLUCONATE 0.12 % MT SOLN: 15.0000 mL | Freq: Two times a day (BID) | OROMUCOSAL | Status: DC

## 2014-11-25 MED ORDER — CLINDAMYCIN PHOSPHATE 900 MG/50ML IV SOLN
900.0000 mg | Freq: Once | INTRAVENOUS | Status: AC
  Administered 2014-11-25: 900 mg via INTRAVENOUS
  Filled 2014-11-25: qty 50

## 2014-11-25 MED ORDER — SODIUM CHLORIDE 0.9 % IJ SOLN
3.0000 mL | Freq: Two times a day (BID) | INTRAMUSCULAR | Status: DC
  Administered 2014-11-25 (×2): 3 mL via INTRAVENOUS

## 2014-11-25 MED ORDER — ALUM & MAG HYDROXIDE-SIMETH 200-200-20 MG/5ML PO SUSP: 30.0000 mL | Freq: Four times a day (QID) | ORAL | Status: DC | PRN

## 2014-11-25 MED ORDER — IPRATROPIUM-ALBUTEROL 0.5-2.5 (3) MG/3ML IN SOLN
3.0000 mL | RESPIRATORY_TRACT | Status: DC
  Administered 2014-11-25: 3 mL via RESPIRATORY_TRACT
  Filled 2014-11-25: qty 3

## 2014-11-25 MED ORDER — CLINDAMYCIN HCL 300 MG PO CAPS: 300.0000 mg | ORAL_CAPSULE | Freq: Three times a day (TID) | ORAL | Status: DC

## 2014-11-25 MED ORDER — HOME MED STORE IN PYXIS: 1.0000 | Status: DC

## 2014-11-25 MED ORDER — IPRATROPIUM-ALBUTEROL 0.5-2.5 (3) MG/3ML IN SOLN: 3.0000 mL | RESPIRATORY_TRACT | Status: DC | PRN

## 2014-11-25 MED ORDER — ENOXAPARIN SODIUM 40 MG/0.4ML ~~LOC~~ SOLN: 40.0000 mg | SUBCUTANEOUS | Status: DC

## 2014-11-25 MED ORDER — ACETAMINOPHEN 650 MG RE SUPP: 650.0000 mg | Freq: Four times a day (QID) | RECTAL | Status: DC | PRN

## 2014-11-25 MED ORDER — ONDANSETRON HCL 4 MG PO TABS: 4.0000 mg | ORAL_TABLET | Freq: Four times a day (QID) | ORAL | Status: DC | PRN

## 2014-11-25 MED ORDER — SODIUM CHLORIDE 0.9 % IV SOLN: 250.0000 mL | INTRAVENOUS | Status: DC | PRN

## 2014-11-25 MED ORDER — CLINDAMYCIN PHOSPHATE 600 MG/50ML IV SOLN
600.0000 mg | Freq: Three times a day (TID) | INTRAVENOUS | Status: DC
  Administered 2014-11-25: 600 mg via INTRAVENOUS
  Filled 2014-11-25 (×5): qty 50

## 2014-11-25 MED ORDER — ACETAMINOPHEN 325 MG PO TABS: 650.0000 mg | ORAL_TABLET | Freq: Four times a day (QID) | ORAL | Status: DC | PRN

## 2014-11-25 MED ORDER — CHLORHEXIDINE GLUCONATE 0.12 % MT SOLN
15.0000 mL | Freq: Two times a day (BID) | OROMUCOSAL | Status: DC
  Administered 2014-11-25: 15 mL via OROMUCOSAL
  Filled 2014-11-25: qty 15

## 2014-11-25 MED ORDER — ONDANSETRON HCL 4 MG/2ML IJ SOLN: 4.0000 mg | Freq: Four times a day (QID) | INTRAMUSCULAR | Status: DC | PRN

## 2014-11-25 MED ORDER — HYDROMORPHONE HCL 1 MG/ML IJ SOLN: 0.5000 mg | INTRAMUSCULAR | Status: DC | PRN

## 2014-11-25 MED ORDER — SODIUM CHLORIDE 0.9 % IJ SOLN: 3.0000 mL | INTRAMUSCULAR | Status: DC | PRN

## 2014-11-25 MED ORDER — IOHEXOL 300 MG/ML  SOLN
80.0000 mL | Freq: Once | INTRAMUSCULAR | Status: AC | PRN
  Administered 2014-11-25: 75 mL via INTRAVENOUS

## 2014-11-25 NOTE — Progress Notes (Signed)
NT, Kathryn Morgan in to room to do CBG check, pt refused to let him check and stated "I have already checked and it is 46"  Will continue to monitor pt

## 2014-11-25 NOTE — Progress Notes (Signed)
This nurse into pt room to introduce self and see if need anything.  Pt stated "I dont need anything, already took my medicines and ate breakfast."  Per pt, insurance will not cover any home meds that she is taking while in hospital.  Educated pt on importance of having medicines verified thru pharmacy.  Will let Dr Ardyth Harps know and continue to monitor pt.

## 2014-11-25 NOTE — ED Notes (Signed)
Report given to [facility] on 300,

## 2014-11-25 NOTE — Discharge Summary (Signed)
Physician Discharge Summary  Kathryn Morgan:[id number] DOB: [birth year] DOA: 11/24/2014  PCP: Catalina Pizza, MD  Admit date: 11/24/2014 Discharge date: 11/25/2014  Time spent: 45 minutes  Recommendations for Outpatient Follow-up:  -Will be discharged home today. -Advised to follow-up with primary care provider in one week. -Discharged on a 10 day course of clindamycin.   Discharge Diagnoses:  Principal Problem:   Salivary gland abscess Active Problems:   COPD (chronic obstructive pulmonary disease)   Secondary cardiomyopathy   Essential hypertension, benign   Salivary gland infections   Type 2 diabetes mellitus   Chronic kidney disease   Discharge Condition: Stable and improved  Filed Weights   11/24/14 2229 11/25/14 0243  Weight: 97.977 kg (216 lb) 98.884 kg (218 lb)    History of present illness:  Kathryn Morgan is a 74 y.o. female with a history of IDDM, HTN, Chronic Systolic CHF, COPD and CKD who presents to the ED with complaints of painful swelling of the left neck and lower jaw area over the past 3 days. She denies any fevers or chills. She Wears dentures and denies any gum trauma. A CT scan of the maxillofacial area revealed a Left submandibular salivary gland inflammation suggestive of an infectious process. IV clindamycin was started in the Ed and she was referred for admission.   Hospital Course:   Left submandibular salivary gland inflammation/infection -Patient's difficulty swallowing, soreness of the neck and swelling of the neck have all improved  over 75% overnight. -CT does not show any discrete abscess or fluid collection that needs drainage. -We'll continue clindamycin for 10 days. -Advised to follow-up with primary care provider in one week to evaluate progress.  Insulin-dependent diabetes mellitus -Fair control in the hospital, continue home medications.  Chronic systolic CHF -Compensated.  Essential hypertension -Well-controlled,  continue losartan, Lasix and spironolactone.  Procedures:  None   Consultations:  None  Discharge Instructions  Discharge Instructions    Diet - low sodium heart healthy    Complete by:  As directed      Diet Carb Modified    Complete by:  As directed      Increase activity slowly    Complete by:  As directed             Medication List    STOP taking these medications        carvedilol 12.5 MG tablet  Commonly known as:  COREG      TAKE these medications        ADVAIR DISKUS 250-50 MCG/DOSE Aepb  Generic drug:  Fluticasone-Salmeterol  Inhale 1 puff into the lungs every 12 (twelve) hours.     albuterol 108 (90 BASE) MCG/ACT inhaler  Commonly known as:  PROVENTIL HFA;VENTOLIN HFA  Inhale 2 puffs into the lungs every 6 (six) hours as needed for wheezing or shortness of breath.     aspirin EC 325 MG tablet  Take 325 mg by mouth every morning.     chlorhexidine 0.12 % solution  Commonly known as:  PERIDEX  15 mLs by Mouth Rinse route 2 (two) times daily.     clindamycin 300 MG capsule  Commonly known as:  CLEOCIN  Take 1 capsule (300 mg total) by mouth 3 (three) times daily. For 10 days     co-enzyme Q-10 30 MG capsule  Take 30 mg by mouth daily.     docusate sodium 100 MG capsule  Commonly known as:  COLACE  Take 100 mg  by mouth 2 (two) times daily.     fish oil-omega-3 fatty acids 1000 MG capsule  Take 1 g by mouth 2 (two) times daily.     furosemide 40 MG tablet  Commonly known as:  LASIX  Take 1 tablet by mouth  every morning     HUMALOG KWIKPEN [place]  Inject 10-15 Units into the skin 3 (three) times daily with meals. Per sliding scale     INVOKANA 100 MG Tabs tablet  Generic drug:  canagliflozin  Take 100 mg by mouth every morning.     losartan 25 MG tablet  Commonly known as:  COZAAR  Take 1 tablet (25 mg total) by mouth daily.     Magnesium 400 MG Caps  Take 1 capsule by mouth daily.     PROBIOTIC FORMULA PO  Take 1 capsule by mouth  every morning.     simvastatin 20 MG tablet  Commonly known as:  ZOCOR  Take 20 mg by mouth at bedtime.     spironolactone 25 MG tablet  Commonly known as:  ALDACTONE  Take 0.5 tablets (12.5 mg total) by mouth daily.     tiotropium 18 MCG inhalation capsule  Commonly known as:  SPIRIVA  Place 18 mcg into inhaler and inhale every morning.       Allergies  Allergen Reactions  . Codeine Itching  . Promethazine Hcl Anxiety and Other (See Comments)    Pt and husband state severe hallucinations and paranoia from phenegran  . Ace Inhibitors Cough  . Adhesive [Tape] Other (See Comments)    Skin tearing  . Levofloxacin Itching and Other (See Comments)    Patient unsure of exact reaction       Follow-up Information    Follow up with Catalina Pizza, MD. Schedule an appointment as soon as possible for a visit on 12/09/2014.   Specialty:  Internal Medicine   Why:  FOLLOW-UP APPT ON TUESDAY JULY 5,2016 AT 3:00   Contact information:    [address]  [place] [address] [phone]        The results of significant diagnostics from this hospitalization (including imaging, microbiology, ancillary and laboratory) are listed below for reference.    Significant Diagnostic Studies: Ct Soft Tissue Neck W Contrast  11/25/2014   CLINICAL DATA:  Acute onset of facial swelling. Pain at the neck and mouth. Swelling under the tongue. Initial encounter.  EXAM: CT NECK WITH CONTRAST  TECHNIQUE: Multidetector CT imaging of the neck was performed using the standard protocol following the bolus administration of intravenous contrast.  CONTRAST:  75mL OMNIPAQUE IOHEXOL 300 MG/ML  SOLN  COMPARISON:  CT of the head and cervical spine performed 01/18/2013  FINDINGS: Pharynx and larynx: The nasopharynx, oropharynx and hypopharynx are unremarkable in appearance. The valleculae and piriform sinuses are within normal limits. There is no evidence of abscess. The parapharyngeal fat planes are preserved. The vocal  cords are relatively symmetric and unremarkable. The proximal trachea is normal in appearance.  Salivary glands: The parotid glands are relatively symmetric and unremarkable in appearance. There is asymmetric prominence of the left and submandibular glands, with minimal nonspecific stranding about the submandibular glands bilaterally, and mild soft tissue edema is noted at the base of the throat. This may reflect a mild infectious process.  Thyroid: The thyroid gland is unremarkable in appearance.  Lymph nodes: No cervical lymphadenopathy is seen.  Vascular: Calcification is noted at the carotid bifurcations bilaterally, likely with mild to moderate luminal  narrowing. Calcification is noted along the aortic arch and proximal great vessels.  Limited intracranial: The visualized portions of the brain are unremarkable in appearance.  Visualized orbits: The visualized portions of the orbits are grossly unremarkable.  Mastoids and visualized paranasal sinuses: The visualized paranasal sinuses and mastoid air cells are well-aerated.  Skeleton: No acute osseous abnormalities are identified.  Upper chest: The patient is status post median sternotomy. The visualized superior mediastinum is grossly unremarkable. The visualized lung apices are clear.  IMPRESSION: 1. Mild asymmetric prominence of the left submandibular gland, with minimal nonspecific stranding about both submandibular glands. Mild soft tissue edema suggested at the base of the throat. This could reflect a mild infectious process. Would correlate for associated symptoms. 2. Calcification noted at the carotid bifurcations bilaterally, likely with mild to moderate bilateral luminal narrowing. Carotid ultrasound would be helpful for further evaluation, when and as deemed clinically appropriate. 3. Calcification along the aortic arch and proximal great vessels.   Electronically Signed   By: Roanna Raider M.D.   On: 11/25/2014 01:22    Microbiology: No results  found for this or any previous visit (from the past 240 hour(s)).   Labs: Basic Metabolic Panel:  Recent Labs Lab 11/24/14 2251 11/25/14 0549  NA 139 141  K 3.5 3.4*  CL 95* 98*  CO2 31 32  GLUCOSE 130* 69  BUN 38* 34*  CREATININE 1.29* 1.12*  CALCIUM 9.0 8.8*   Liver Function Tests:  Recent Labs Lab 11/24/14 2251  AST 23  ALT 26  ALKPHOS 53  BILITOT 0.7  PROT 7.4  ALBUMIN 3.9   No results for input(s): LIPASE, AMYLASE in the last 168 hours. No results for input(s): AMMONIA in the last 168 hours. CBC:  Recent Labs Lab 11/24/14 2251 11/25/14 0549  WBC 11.0* 8.5  NEUTROABS 7.6  --   HGB 12.3 11.8*  HCT 38.2 37.0  MCV 88.0 88.1  PLT 193 177   Cardiac Enzymes: No results for input(s): CKTOTAL, CKMB, CKMBINDEX, TROPONINI in the last 168 hours. BNP: BNP (last 3 results) No results for input(s): BNP in the last 8760 hours.  ProBNP (last 3 results) No results for input(s): PROBNP in the last 8760 hours.  CBG: No results for input(s): GLUCAP in the last 168 hours.     SignedChaya Jan  [facility] Pager: [phone] 11/25/2014, 1:16 PM

## 2014-11-25 NOTE — H&P (Addendum)
[facility] Admission History and Physical       Kathryn Morgan HTX:[id number] DOB: Oct 16, 1940 DOA: 11/24/2014  Referring physician:  PCP: Catalina Pizza, MD  Specialists:   Chief Complaint: Painful Swelling of Left Jaw and Neck  HPI: Kathryn Morgan is a 74 y.o. female with a history of IDDM, HTN, Chronic Systolic CHF, COPD and CKD who presents to the ED with complaints of painful swelling of the left neck and lower jaw area over the past 3 days.  She denies any fevers or chills.  She  Wears dentures and denies any gum trauma.    A CT scan of the maxillofacial area revealed a Left submandibular salivary gland inflammation suggestive of an infectious process.   IV clindamycin was started in the Ed and she was referred for admission.      Review of Systems:  Constitutional: No Weight Loss, No Weight Gain, Night Sweats, Fevers, Chills, Dizziness, Light Headedness, Fatigue, or Generalized Weakness HEENT: No Headaches, Difficulty Swallowing,+Swelling and  Pain of left jaw and Neck, No Tooth/Dental Problems,Sore Throat,  No Sneezing, Rhinitis, Ear Ache, Nasal Congestion, or Post Nasal Drip,  Cardio-vascular:  No Chest pain, Orthopnea, PND, Edema in Lower Extremities, Anasarca, Dizziness, Palpitations  Resp: No Dyspnea, No DOE, No Productive Cough, No Non-Productive Cough, No Hemoptysis, No Wheezing.    GI: No Heartburn, Indigestion, Abdominal Pain, Nausea, Vomiting, Diarrhea, Constipation, Hematemesis, Hematochezia, Melena, Change in Bowel Habits,  Loss of Appetite  GU: No Dysuria, No Change in Color of Urine, No Urgency or Urinary Frequency, No Flank pain.  Musculoskeletal: No Joint Pain or Swelling, No Decreased Range of Motion, No Back Pain.  Neurologic: No Syncope, No Seizures, Muscle Weakness, Paresthesia, Vision Disturbance or Loss, No Diplopia, No Vertigo, No Difficulty Walking,  Skin: No Rash or Lesions. Psych: No Change in Mood or Affect, No Depression or Anxiety, No Memory  loss, No Confusion, or Hallucinations   Past Medical History  Diagnosis Date  . Type 2 diabetes mellitus     Insulin pump  . Essential hypertension, benign   . Dyslipidemia   . Depression   . Necrotizing pancreatitis   . Neuropathy   . Rhabdomyolysis   . Gallstone pancreatitis     2005  . Chronic systolic heart failure     LVEF 35%  . Aortic stenosis     Status post 68mm [facility] Ease pericardial tissue valve  . Right bundle-branch block   . Rheumatic fever   . Osteoporosis   . GERD (gastroesophageal reflux disease)   . COPD (chronic obstructive pulmonary disease)   . Chronic kidney disease      Past Surgical History  Procedure Laterality Date  . Esophagogastroduodenoscopy  04/21/04    Normal esophagus/normal D1,D2; attempted  transgastric drainage/stent placement of pancreatic pseudocyst, no obvious location for gastrotomy, therefore transferred to [facility]  . Debridement pancreas    . Pancreatic pseudocyst drainage    . Cholecystectomy    . Aortic valve replacement  08/27/2011    Procedure: AORTIC VALVE REPLACEMENT (AVR);  Surgeon: Purcell Nails, MD;  Location: [facility] OR;  Service: Open Heart Surgery;  Laterality: N/A;  . Left and right heart catheterization with coronary angiogram N/A 06/03/2011    Procedure: LEFT AND RIGHT HEART CATHETERIZATION WITH CORONARY ANGIOGRAM;  Surgeon: Kathleene Hazel, MD;  Location: [facility] CATH LAB;  Service: Cardiovascular;  Laterality: N/A;  Right Heart Cath also      Prior to Admission medications   Medication Sig Start  Date End Date Taking? Authorizing Provider  albuterol (PROVENTIL HFA;VENTOLIN HFA) 108 (90 BASE) MCG/ACT inhaler Inhale 2 puffs into the lungs every 6 (six) hours as needed for wheezing or shortness of breath.    Historical Provider, MD  aspirin EC 325 MG tablet Take 325 mg by mouth every morning.     Historical Provider, MD  Canagliflozin (INVOKANA) 100 MG TABS Take 100 mg by mouth every morning.    Historical  Provider, MD  carvedilol (COREG) 12.5 MG tablet Take 1 tablet (12.5 mg total) by mouth 2 (two) times daily with a meal. 10/19/13 11/01/14  Jonelle Sidle, MD  co-enzyme Q-10 30 MG capsule Take 30 mg by mouth daily.    Historical Provider, MD  docusate sodium (COLACE) 100 MG capsule Take 100 mg by mouth 2 (two) times daily.      Historical Provider, MD  fish oil-omega-3 fatty acids 1000 MG capsule Take 1 g by mouth 2 (two) times daily.     Historical Provider, MD  Fluticasone-Salmeterol (ADVAIR DISKUS) 250-50 MCG/DOSE AEPB Inhale 1 puff into the lungs every 12 (twelve) hours.      Historical Provider, MD  furosemide (LASIX) 40 MG tablet Take 1 tablet by mouth  every morning 08/29/14   Jonelle Sidle, MD  Insulin Lispro, Human, (HUMALOG KWIKPEN [place]) Inject 10-15 Units into the skin 3 (three) times daily with meals. Per sliding scale    Historical Provider, MD  losartan (COZAAR) 25 MG tablet Take 1 tablet (25 mg total) by mouth daily. 10/19/13   Jonelle Sidle, MD  Magnesium 400 MG CAPS Take 1 capsule by mouth daily.    Historical Provider, MD  Probiotic Product (PROBIOTIC FORMULA PO) Take 1 capsule by mouth every morning.     Historical Provider, MD  simvastatin (ZOCOR) 20 MG tablet Take 20 mg by mouth at bedtime.      Historical Provider, MD  spironolactone (ALDACTONE) 25 MG tablet Take 0.5 tablets (12.5 mg total) by mouth daily. 10/19/13   Jonelle Sidle, MD  tiotropium (SPIRIVA) 18 MCG inhalation capsule Place 18 mcg into inhaler and inhale every morning.     Historical Provider, MD     Allergies  Allergen Reactions  . Codeine Itching  . Promethazine Hcl Anxiety and Other (See Comments)    Pt and husband state severe hallucinations and paranoia from phenegran  . Ace Inhibitors Cough  . Adhesive [Tape] Other (See Comments)    Skin tearing  . Levofloxacin Itching and Other (See Comments)    Patient unsure of exact reaction    Social History:  reports that she quit smoking about 33  years ago. Her smoking use included Cigarettes. She has a 45 pack-year smoking history. She does not have any smokeless tobacco history on file. She reports that she does not drink alcohol or use illicit drugs.    Family History  Problem Relation Age of Onset  . Colon cancer Neg Hx   . Anesthesia problems Neg Hx   . Heart disease      CHF  . Heart attack Father        Physical Exam:  GEN:  Pleasant Obese Elderly 74 y.o. Caucasian female examined and in no acute distress; cooperative with exam Filed Vitals:   11/25/14 0100 11/25/14 0130 11/25/14 0142 11/25/14 0243  BP: 128/64 111/36  114/60  Pulse: 86 78  81  Temp:   98.1 F (36.7 C) 98.4 F (36.9 C)  TempSrc:   Oral Oral  Resp:    20  Height:     (1.626 m)  Weight:    98.884 kg (218 lb)  SpO2: 92% 91%  92%   Blood pressure 114/60, pulse 81, temperature 98.4 F (36.9 C), temperature source Oral, resp. rate 20, height  (1.626 m), weight 98.884 kg (218 lb), SpO2 92 %. PSYCH: She is alert and oriented x4; does not appear anxious does not appear depressed; affect is normal HEENT: Normocephalic and Atraumatic, Mucous membranes pink; PERRLA; EOM intact; Fundi:  Benign;  No scleral icterus, Nares: Patent, Oropharynx: Clear, Fair Dentition,    Neck:  +Enlarged, Inflamed and Tender Left Submandibular Area , Neck with FROM, No Thyromegaly or Carotid Bruit; No JVD; Breasts:: Not examined CHEST WALL: No tenderness CHEST: Normal respiration, clear to auscultation bilaterally HEART: Regular rate and rhythm; no murmurs rubs or gallops BACK: No kyphosis or scoliosis; No CVA tenderness ABDOMEN: Positive Bowel Sounds, Obese, Soft Non-Tender, No Rebound or Guarding; No Masses, No Organomegaly. Rectal Exam: Not done EXTREMITIES: No Cyanosis, Clubbing, or Edema; No Ulcerations. Genitalia: not examined PULSES: 2+ and symmetric SKIN: Normal hydration no rash or ulceration CNS:  Alert and Oriented x 4, No Focal Deficits Vascular: pulses  palpable throughout    Labs on Admission:  Basic Metabolic Panel:  Recent Labs Lab 11/24/14 2251  NA 139  K 3.5  CL 95*  CO2 31  GLUCOSE 130*  BUN 38*  CREATININE 1.29*  CALCIUM 9.0   Liver Function Tests:  Recent Labs Lab 11/24/14 2251  AST 23  ALT 26  ALKPHOS 53  BILITOT 0.7  PROT 7.4  ALBUMIN 3.9   No results for input(s): LIPASE, AMYLASE in the last 168 hours. No results for input(s): AMMONIA in the last 168 hours. CBC:  Recent Labs Lab 11/24/14 2251  WBC 11.0*  NEUTROABS 7.6  HGB 12.3  HCT 38.2  MCV 88.0  PLT 193   Cardiac Enzymes: No results for input(s): CKTOTAL, CKMB, CKMBINDEX, TROPONINI in the last 168 hours.  BNP (last 3 results) No results for input(s): BNP in the last 8760 hours.  ProBNP (last 3 results) No results for input(s): PROBNP in the last 8760 hours.  CBG: No results for input(s): GLUCAP in the last 168 hours.  Radiological Exams on Admission: Ct Soft Tissue Neck W Contrast  11/25/2014   CLINICAL DATA:  Acute onset of facial swelling. Pain at the neck and mouth. Swelling under the tongue. Initial encounter.  EXAM: CT NECK WITH CONTRAST  TECHNIQUE: Multidetector CT imaging of the neck was performed using the standard protocol following the bolus administration of intravenous contrast.  CONTRAST:  75mL OMNIPAQUE IOHEXOL 300 MG/ML  SOLN  COMPARISON:  CT of the head and cervical spine performed 01/18/2013  FINDINGS: Pharynx and larynx: The nasopharynx, oropharynx and hypopharynx are unremarkable in appearance. The valleculae and piriform sinuses are within normal limits. There is no evidence of abscess. The parapharyngeal fat planes are preserved. The vocal cords are relatively symmetric and unremarkable. The proximal trachea is normal in appearance.  Salivary glands: The parotid glands are relatively symmetric and unremarkable in appearance. There is asymmetric prominence of the left and submandibular glands, with minimal nonspecific  stranding about the submandibular glands bilaterally, and mild soft tissue edema is noted at the base of the throat. This may reflect a mild infectious process.  Thyroid: The thyroid gland is unremarkable in appearance.  Lymph nodes: No cervical lymphadenopathy is seen.  Vascular: Calcification is noted at the carotid  bifurcations bilaterally, likely with mild to moderate luminal narrowing. Calcification is noted along the aortic arch and proximal great vessels.  Limited intracranial: The visualized portions of the brain are unremarkable in appearance.  Visualized orbits: The visualized portions of the orbits are grossly unremarkable.  Mastoids and visualized paranasal sinuses: The visualized paranasal sinuses and mastoid air cells are well-aerated.  Skeleton: No acute osseous abnormalities are identified.  Upper chest: The patient is status post median sternotomy. The visualized superior mediastinum is grossly unremarkable. The visualized lung apices are clear.  IMPRESSION: 1. Mild asymmetric prominence of the left submandibular gland, with minimal nonspecific stranding about both submandibular glands. Mild soft tissue edema suggested at the base of the throat. This could reflect a mild infectious process. Would correlate for associated symptoms. 2. Calcification noted at the carotid bifurcations bilaterally, likely with mild to moderate bilateral luminal narrowing. Carotid ultrasound would be helpful for further evaluation, when and as deemed clinically appropriate. 3. Calcification along the aortic arch and proximal great vessels.   Electronically Signed   By: Roanna Raider M.D.   On: 11/25/2014 01:22     Assessment/Plan:   74 y.o. female with   Active Problems:   1.   Salivary gland infections/Salivary gland abscess   IV Clindamycin   May need ENT Consultation     2.   Type 2 diabetes mellitus   Continue Insulin and SSI coverage   SSI coverage PRN   Hold Metformin Rx x 72 hrs after CT scan       3.  Secondary cardiomyopathy/ Chronic  Systolic CHF   On Lasix Rx, Losartan and Spironolactone Rx   Strict I/Os     4.  Essential hypertension, benign   Continue Losartan Rx, and Lasix Rx and Spironolactone   Monitor BPs         5.  Chronic kidney disease   Monitor BUN/Cr   Gentle IVFs     6. COPD  Duonebs       7.  DVT Prophylaxis    Lovenox     Code Status:     FULL CODE       Family Communication:   Husband at Bedside   Disposition Plan:    Inpatient  Status        Time spent: 52 Minutes      Ron Parker [facility] Pager [phone]   If 7AM -7PM Please Contact the Day Rounding Team MD for [facility]  If 7PM-7AM, Please Contact Night-Floor Coverage  [email or web address] Password [email or web address] 11/25/2014, 4:44 AM     ADDENDUM:   Patient was seen and examined on 11/25/2014

## 2014-11-25 NOTE — Care Management Note (Signed)
Case Management Note  Patient Details  Name: Kathryn Morgan MRN: [id number] Date of Birth: [birth year]  Expected Discharge Date:  11/28/14               Expected Discharge Plan:  Home/Self Care  In-House Referral:  NA  Discharge planning Services  CM Consult  Post Acute Care Choice:  NA Choice offered to:  NA  DME Arranged:    DME Agency:     HH Arranged:    HH Agency:     Status of Service:  Completed, signed off  Medicare Important Message Given:    Date Medicare IM Given:    Medicare IM give by:    Date Additional Medicare IM Given:    Additional Medicare Important Message give by:     If discussed at Long Length of Stay Meetings, dates discussed:    Additional Comments: Pt is from home, lives with husband and is independent with ADL's. Pt discharging home today with self care. No CM needs.  Malcolm Metro, RN 11/25/2014, 11:49 AM

## 2014-11-27 LAB — HEMOGLOBIN A1C
Hgb A1c MFr Bld: 7 % — ABNORMAL HIGH (ref 4.8–5.6)
Mean Plasma Glucose: 154 mg/dL

## 2014-12-02 ENCOUNTER — Other Ambulatory Visit: Payer: Self-pay

## 2014-12-17 ENCOUNTER — Other Ambulatory Visit: Payer: Self-pay | Admitting: Cardiology

## 2015-02-04 ENCOUNTER — Other Ambulatory Visit: Payer: Self-pay | Admitting: Cardiology

## 2015-02-12 ENCOUNTER — Other Ambulatory Visit (HOSPITAL_COMMUNITY): Payer: Self-pay | Admitting: Internal Medicine

## 2015-02-12 DIAGNOSIS — Z1231 Encounter for screening mammogram for malignant neoplasm of breast: Secondary | ICD-10-CM

## 2015-02-19 ENCOUNTER — Ambulatory Visit (HOSPITAL_COMMUNITY)
Admission: RE | Admit: 2015-02-19 | Discharge: 2015-02-19 | Disposition: A | Discharge status: Home / Self Care | Payer: Medicare Other | Source: Ambulatory Visit | Attending: Internal Medicine | Admitting: Internal Medicine

## 2015-02-19 DIAGNOSIS — Z1231 Encounter for screening mammogram for malignant neoplasm of breast: Secondary | ICD-10-CM | POA: Diagnosis present

## 2015-04-02 ENCOUNTER — Ambulatory Visit (INDEPENDENT_AMBULATORY_CARE_PROVIDER_SITE_OTHER): Payer: Medicare Other | Admitting: Cardiology

## 2015-04-02 ENCOUNTER — Encounter: Payer: Self-pay | Admitting: Cardiology

## 2015-04-02 ENCOUNTER — Telehealth: Payer: Self-pay | Admitting: Cardiology

## 2015-04-02 VITALS — BP 132/72 | HR 92 | Ht 64.5 in | Wt 211.6 lb

## 2015-04-02 DIAGNOSIS — I10 Essential (primary) hypertension: Secondary | ICD-10-CM

## 2015-04-02 DIAGNOSIS — R0602 Shortness of breath: Secondary | ICD-10-CM | POA: Diagnosis not present

## 2015-04-02 DIAGNOSIS — I350 Nonrheumatic aortic (valve) stenosis: Secondary | ICD-10-CM

## 2015-04-02 DIAGNOSIS — E118 Type 2 diabetes mellitus with unspecified complications: Secondary | ICD-10-CM

## 2015-04-02 DIAGNOSIS — Z954 Presence of other heart-valve replacement: Secondary | ICD-10-CM | POA: Diagnosis not present

## 2015-04-02 DIAGNOSIS — Z794 Long term (current) use of insulin: Secondary | ICD-10-CM

## 2015-04-02 DIAGNOSIS — E785 Hyperlipidemia, unspecified: Secondary | ICD-10-CM

## 2015-04-02 DIAGNOSIS — Z952 Presence of prosthetic heart valve: Secondary | ICD-10-CM

## 2015-04-02 MED ORDER — LOSARTAN POTASSIUM 50 MG PO TABS: 50.0000 mg | ORAL_TABLET | Freq: Every day | ORAL | Status: DC

## 2015-04-02 NOTE — Telephone Encounter (Signed)
Echo/SOB Scheduled at [facility]  Oct 31 arrive at 10:15

## 2015-04-02 NOTE — Patient Instructions (Signed)
Your physician wants you to follow-up in: 6 Months with Dr. Diona Browner. You will receive a reminder letter in the mail two months in advance. If you don't receive a letter, please call our office to schedule the follow-up appointment.  Your physician has recommended you make the following change in your medication:   Stop taking Hyzaar  Start taking Cozaar 50 mg Daily   Your physician has requested that you have an echocardiogram. Echocardiography is a painless test that uses sound waves to create images of your heart. It provides your doctor with information about the size and shape of your heart and how well your heart's chambers and valves are working. This procedure takes approximately one hour. There are no restrictions for this procedure.  If you need a refill on your cardiac medications before your next appointment, please call your pharmacy.  Thank you for choosing [facility]!

## 2015-04-02 NOTE — Progress Notes (Signed)
Cardiology Office Note  Date: 04/02/2015   ID: DARWIN GUASTELLA, DOB 1941/01/13, MRN [id number]  PCP: Dwana Melena, MD  Primary Cardiologist: Nona Dell, MD   Chief Complaint  Patient presents with  . Status post AVR  . Cardiomyopathy    History of Present Illness: Kathryn Morgan is a 74 y.o. female last seen in December 2015. She presents for a routine cardiac visit. She denies any exertional chest pain, states that some days, perhaps twice a month, she feels very weak after taking her medications in the morning and has to lie down. She has had no palpitations or syncope. Also reports intermittent vertigo symptoms when she sits up quickly or turns her head quickly.  We reviewed her medications which are outlined below. I note that she is on both Hyzaar and Cozaar. We discussed this and will make a simplification to Cozaar alone. She had recent lab work with Dr. Margo Aye, reviewed below. Mild renal insufficiency with elevated BUN was noted.  She has not had any significant leg edema, continues on Aldactone and Lasix. No orthopnea or PND. Her weight is down about 10 pounds overall from December 2015.  She reports NYHA class II dyspnea, walks at a slow paced for 20 minutes each morning. She had trouble with hot temperatures over the summer.  Last echocardiogram in 2013 showed significant improvement in LVEF and stable aortic prosthetic function.   Past Medical History  Diagnosis Date  . Type 2 diabetes mellitus (HCC)     Insulin pump  . Essential hypertension, benign   . Dyslipidemia   . Depression   . Necrotizing pancreatitis   . Neuropathy (HCC)   . Rhabdomyolysis   . Gallstone pancreatitis     2005  . Chronic systolic heart failure (HCC)     LVEF 35%  . Aortic stenosis     Status post 23mm [facility] Ease pericardial tissue valve  . Right bundle-branch block   . Rheumatic fever   . Osteoporosis   . GERD (gastroesophageal reflux disease)   . COPD (chronic  obstructive pulmonary disease) (HCC)   . Chronic kidney disease     Past Surgical History  Procedure Laterality Date  . Esophagogastroduodenoscopy  04/21/04    Normal esophagus/normal D1,D2; attempted  transgastric drainage/stent placement of pancreatic pseudocyst, no obvious location for gastrotomy, therefore transferred to [facility]  . Debridement pancreas    . Pancreatic pseudocyst drainage    . Cholecystectomy    . Aortic valve replacement  08/27/2011    Procedure: AORTIC VALVE REPLACEMENT (AVR);  Surgeon: Purcell Nails, MD;  Location: [facility] OR;  Service: Open Heart Surgery;  Laterality: N/A;  . Left and right heart catheterization with coronary angiogram N/A 06/03/2011    Procedure: LEFT AND RIGHT HEART CATHETERIZATION WITH CORONARY ANGIOGRAM;  Surgeon: Kathleene Hazel, MD;  Location: [facility] CATH LAB;  Service: Cardiovascular;  Laterality: N/A;  Right Heart Cath also    Current Outpatient Prescriptions  Medication Sig Dispense Refill  . albuterol (PROVENTIL HFA;VENTOLIN HFA) 108 (90 BASE) MCG/ACT inhaler Inhale 2 puffs into the lungs every 6 (six) hours as needed for wheezing or shortness of breath.    Marland Kitchen aspirin EC 325 MG tablet Take 325 mg by mouth every morning.     . Canagliflozin (INVOKANA) 100 MG TABS Take 100 mg by mouth every morning.    . carvedilol (COREG) 12.5 MG tablet Take 1 tablet by mouth  twice a day with meals 180 tablet 0  .  chlorhexidine (PERIDEX) 0.12 % solution 15 mLs by Mouth Rinse route 2 (two) times daily. 120 mL 0  . clindamycin (CLEOCIN) 300 MG capsule Take 1 capsule (300 mg total) by mouth 3 (three) times daily. For 10 days 30 capsule 0  . co-enzyme Q-10 30 MG capsule Take 30 mg by mouth daily.    Marland Kitchen docusate sodium (COLACE) 100 MG capsule Take 100 mg by mouth 2 (two) times daily.      . fish oil-omega-3 fatty acids 1000 MG capsule Take 1 g by mouth 2 (two) times daily.     . Fluticasone-Salmeterol (ADVAIR DISKUS) 250-50 MCG/DOSE AEPB Inhale 1 puff into the  lungs every 12 (twelve) hours.      . furosemide (LASIX) 40 MG tablet Take 1 tablet by mouth  every morning 90 tablet 3  . gabapentin (NEURONTIN) 100 MG capsule     . Insulin Lispro, Human, (HUMALOG KWIKPEN [place]) Inject 10-15 Units into the skin 3 (three) times daily with meals. Per sliding scale    . INVOKANA 300 MG TABS tablet     . Magnesium 400 MG CAPS Take 1 capsule by mouth daily.    . metFORMIN (GLUCOPHAGE) 1000 MG tablet     . Probiotic Product (PROBIOTIC FORMULA PO) Take 1 capsule by mouth every morning.     . simvastatin (ZOCOR) 20 MG tablet Take 20 mg by mouth at bedtime.      Marland Kitchen spironolactone (ALDACTONE) 25 MG tablet Take one-half tablet by  mouth daily 45 tablet 0  . tiotropium (SPIRIVA) 18 MCG inhalation capsule Place 18 mcg into inhaler and inhale every morning.     Marland Kitchen TOUJEO SOLOSTAR 300 UNIT/ML SOPN     . [DISCONTINUED] losartan (COZAAR) 25 MG tablet Take 1 tablet (25 mg total) by mouth daily. 90 tablet 3   No current facility-administered medications for this visit.    Allergies:  Codeine; Promethazine hcl; Ace inhibitors; Adhesive; and Levofloxacin   Social History: The patient  reports that she quit smoking about 33 years ago. Her smoking use included Cigarettes. She has a 45 pack-year smoking history. She does not have any smokeless tobacco history on file. She reports that she does not drink alcohol or use illicit drugs.   ROS:  Please see the history of present illness. Otherwise, complete review of systems is positive for occasional indigestion but stable appetite, no fevers or chills..  All other systems are reviewed and negative.   Physical Exam: VS:  BP 132/72 mmHg  Pulse 92  Ht 5' 4.5" (1.638 m)  Wt 211 lb 9.6 oz (95.981 kg)  BMI 35.77 kg/m2  SpO2 92%, BMI Body mass index is 35.77 kg/(m^2).  Wt Readings from Last 3 Encounters:  04/02/15 211 lb 9.6 oz (95.981 kg)  11/25/14 218 lb (98.884 kg)  05/23/14 220 lb (99.791 kg)    Overweight woman, appears  comfortable at rest. HEENT: Conjunctiva and lids normal, oropharynx clear.  Neck: Supple, no elevated JVP, no thyromegaly.  Lungs: Clear to auscultation, nonlabored breathing at rest.  Cardiac: Regular rate and rhythm, no S3, 2/6 systolic murmur, no pericardial rub.  Abdomen: Soft, nontender, bowel sounds present, no guarding or rebound.  Extremities: No pitting edema, distal pulses 2+.  Skin: Warm and dry.  Musculoskeletal: No kyphosis.  Neuropsychiatric: Alert and oriented x3, affect grossly appropriate.  ECG: ECG is ordered today shows normal sinus rhythm with old right bundle-branch block and left anterior fascicular block.  Recent Labwork: 11/24/2014: ALT 26; AST 23  11/25/2014: BUN 34*; Creatinine, Ser 1.12*; Hemoglobin 11.8*; Platelets 177; Potassium 3.4*; Sodium 141  August 2016: BUN 28, creatinine 1.0, potassium 4.8, AST 23, ALT 20, Hgb 12.6, platelets 229, cholesterol 139, triglycerides 151, HDL 51, LDL 58, HgbA1C 6.9  Other Studies Reviewed Today:  Echocardiogram 05/26/2012: Study Conclusions  - Left ventricle: The cavity size was normal. There was moderate concentric hypertrophy. Systolic function was normal. The estimated ejection fraction was in the range of 50% to 55%. Wall motion was normal; there were no regional wall motion abnormalities. - Aortic valve: A prosthesis, probably a bioprosthetic valve, was present and appeared to be functioning normally with normal hemodynamics. - Mitral valve: Calcified annulus. - Left atrium: The atrium was mildly dilated. - Atrial septum: No defect or patent foramen ovale was identified. Impressions:  - Compared to the previous study performed 08/27/11, interval AVR surgery. LV function has improved.  ASSESSMENT AND PLAN:  1. Aortic stenosis status post bioprosthetic aortic valve replacement in March 2013. We will plan a follow-up echocardiogram for reassessment of valve function and LVEF, her last  study being in 2013.  2. Essential hypertension. Blood pressure control is adequate today. I reviewed her antihypertensive medications. She will stop Hyzaar, and we will change Cozaar to 50 mg daily.  3. Mild renal insufficiency by most recent lab work. Hopefully should improve by simplifying medications and removing HCTZ from her regimen.  4. Type 2 diabetes mellitus, continues on insulin, keep follow-up with Dr. Margo Aye. Hemoglobin A1c has come down to 6.9.  5. Hyperlipidemia, on statin therapy, recent LDL 58.  Current medicines were reviewed at length with the patient today.   Orders Placed This Encounter  Procedures  . EKG 12-Lead    Disposition: FU with me in 6 months.   Signed, Jonelle Sidle, MD, [facility] 04/02/2015 1:50 PM    [facility] at [facility] [address] [place], [place] [address] Phone: [phone]; Fax: [phone]

## 2015-04-03 NOTE — Telephone Encounter (Signed)
04/03/15 AUTH # [id number] EXP 05/18/15

## 2015-04-07 ENCOUNTER — Ambulatory Visit (HOSPITAL_COMMUNITY)
Admission: RE | Admit: 2015-04-07 | Discharge: 2015-04-07 | Disposition: A | Discharge status: Home / Self Care | Payer: Medicare Other | Source: Ambulatory Visit | Attending: Cardiology | Admitting: Cardiology

## 2015-04-07 DIAGNOSIS — Z954 Presence of other heart-valve replacement: Secondary | ICD-10-CM | POA: Diagnosis not present

## 2015-04-07 DIAGNOSIS — R0602 Shortness of breath: Secondary | ICD-10-CM | POA: Diagnosis not present

## 2015-04-09 ENCOUNTER — Other Ambulatory Visit: Payer: Self-pay | Admitting: Cardiology

## 2015-04-15 ENCOUNTER — Ambulatory Visit: Payer: Self-pay | Admitting: "Endocrinology

## 2015-06-18 ENCOUNTER — Telehealth: Payer: Self-pay | Admitting: Cardiology

## 2015-06-18 NOTE — Telephone Encounter (Signed)
Error/tg °

## 2015-09-08 ENCOUNTER — Telehealth: Payer: Self-pay | Admitting: Cardiology

## 2015-09-08 NOTE — Telephone Encounter (Signed)
Pt states that she thinks her ASA is abd pain and nausea. Patient states that she does eat when taking ASA in the morning. Patient states that she will start taking at night with supper to see if sx decrease.

## 2015-09-08 NOTE — Telephone Encounter (Signed)
Pt states she's having some stomach pain/ nausea after taking her Asprin in the mornings

## 2015-09-24 ENCOUNTER — Other Ambulatory Visit: Payer: Self-pay | Admitting: Cardiology

## 2015-11-26 ENCOUNTER — Other Ambulatory Visit: Payer: Self-pay | Admitting: Cardiology

## 2016-01-28 ENCOUNTER — Other Ambulatory Visit (HOSPITAL_COMMUNITY): Payer: Self-pay | Admitting: Internal Medicine

## 2016-01-28 DIAGNOSIS — I999 Unspecified disorder of circulatory system: Secondary | ICD-10-CM

## 2016-02-02 NOTE — Progress Notes (Signed)
Cardiology Office Note  Date: 02/03/2016   ID: Kathryn Morgan, DOB 1940-06-18, MRN [id number]  PCP: Dwana Melena, MD  Primary Cardiologist: Nona Dell, MD   Chief Complaint  Patient presents with  . Aortic valve disease    History of Present Illness: Kathryn Morgan is a 75 y.o. female last seen in October 2016. She presents for a routine follow-up visit. Overall no significant change from a cardiac perspective. She reports no angina and has NYHA class II dyspnea. No palpitations or syncope.  Follow-up echocardiogram from October of last year is outlined below. This showed normal LVEF and bioprosthetic AVR function. We discussed the results today.  I went over her medications. Cardiac regimen includes aspirin, Coreg, Lasix, Cozaar, Zocor, and Aldactone. She does not report any significant leg edema. She has had some redness of her lower legs but no pain. I see that lower extremity arterial studies have been ordered by Dr. Margo Aye.  Past Medical History:  Diagnosis Date  . Aortic stenosis    Status post 23mm [facility] Ease pericardial tissue valve  . Chronic kidney disease   . Chronic systolic heart failure (HCC)    LVEF 35%  . COPD (chronic obstructive pulmonary disease) (HCC)   . Depression   . Dyslipidemia   . Essential hypertension, benign   . Gallstone pancreatitis    2005  . GERD (gastroesophageal reflux disease)   . Necrotizing pancreatitis   . Neuropathy (HCC)   . Osteoporosis   . Rhabdomyolysis   . Rheumatic fever   . Right bundle-branch block   . Type 2 diabetes mellitus (HCC)    Insulin pump    Past Surgical History:  Procedure Laterality Date  . AORTIC VALVE REPLACEMENT  08/27/2011   Procedure: AORTIC VALVE REPLACEMENT (AVR);  Surgeon: Purcell Nails, MD;  Location: [facility] OR;  Service: Open Heart Surgery;  Laterality: N/A;  . CHOLECYSTECTOMY    . Debridement pancreas    . ESOPHAGOGASTRODUODENOSCOPY  04/21/04   Normal esophagus/normal D1,D2;  attempted  transgastric drainage/stent placement of pancreatic pseudocyst, no obvious location for gastrotomy, therefore transferred to [facility]  . LEFT AND RIGHT HEART CATHETERIZATION WITH CORONARY ANGIOGRAM N/A 06/03/2011   Procedure: LEFT AND RIGHT HEART CATHETERIZATION WITH CORONARY ANGIOGRAM;  Surgeon: Kathleene Hazel, MD;  Location: [facility] CATH LAB;  Service: Cardiovascular;  Laterality: N/A;  Right Heart Cath also  . PANCREATIC PSEUDOCYST DRAINAGE      Current Outpatient Prescriptions  Medication Sig Dispense Refill  . albuterol (PROVENTIL HFA;VENTOLIN HFA) 108 (90 BASE) MCG/ACT inhaler Inhale 2 puffs into the lungs every 6 (six) hours as needed for wheezing or shortness of breath.    Marland Kitchen aspirin EC 325 MG tablet Take 325 mg by mouth every morning.     . carvedilol (COREG) 12.5 MG tablet Take 1 tablet by mouth  twice a day with meals 180 tablet 0  . Clobetasol Prop Crea-Coal Tar (CLOBETA CREAM EX) Apply topically.    Marland Kitchen co-enzyme Q-10 30 MG capsule Take 30 mg by mouth daily.    Marland Kitchen docusate sodium (COLACE) 100 MG capsule Take 100 mg by mouth 2 (two) times daily.      . fish oil-omega-3 fatty acids 1000 MG capsule Take 2 g by mouth 2 (two) times daily.     . Fluticasone-Salmeterol (ADVAIR DISKUS) 250-50 MCG/DOSE AEPB Inhale 1 puff into the lungs every 12 (twelve) hours.      . furosemide (LASIX) 40 MG tablet Take 1 tablet by  mouth  every morning 90 tablet 3  . Insulin Lispro, Human, (HUMALOG KWIKPEN [place]) Inject 10-15 Units into the skin 3 (three) times daily with meals. Per sliding scale    . losartan (COZAAR) 50 MG tablet Take 1 tablet by mouth  daily 90 tablet 3  . Magnesium 400 MG CAPS Take 1 capsule by mouth daily.    . metFORMIN (GLUCOPHAGE) 1000 MG tablet Take 1,000 mg by mouth 2 (two) times daily with a meal.     . Probiotic Product (PROBIOTIC FORMULA PO) Take 1 capsule by mouth every morning.     . simvastatin (ZOCOR) 10 MG tablet Take 10 mg by mouth daily.    Marland Kitchen. spironolactone  (ALDACTONE) 25 MG tablet Take one-half tablet by  mouth daily 45 tablet 3  . tiotropium (SPIRIVA) 18 MCG inhalation capsule Place 18 mcg into inhaler and inhale every morning.     Marland Kitchen. TOUJEO SOLOSTAR 300 UNIT/ML SOPN      No current facility-administered medications for this visit.    Allergies:  Codeine; Promethazine hcl; Ace inhibitors; Adhesive [tape]; and Levofloxacin   Social History: The patient  reports that she quit smoking about 34 years ago. Her smoking use included Cigarettes. She has a 45.00 pack-year smoking history. She has never used smokeless tobacco. She reports that she does not drink alcohol or use drugs.   ROS:  Please see the history of present illness. Otherwise, complete review of systems is positive for skin rash [facility].  All other systems are reviewed and negative.   Physical Exam: VS:  BP 130/88   Pulse 79   Ht 5\' 4"  (1.626 m)   Wt 218 lb (98.9 kg)   SpO2 91%   BMI 37.42 kg/m , BMI Body mass index is 37.42 kg/m.  Wt Readings from Last 3 Encounters:  02/03/16 218 lb (98.9 kg)  04/02/15 211 lb 9.6 oz (96 kg)  11/25/14 218 lb (98.9 kg)    Overweight woman, appears comfortable at rest. HEENT: Conjunctiva and lids normal, oropharynx clear.  Neck: Supple, no elevated JVP, no thyromegaly.  Lungs: Clear to auscultation, nonlabored breathing at rest.  Cardiac: Regular rate and rhythm, no S3, 2/6 systolic murmur, no pericardial rub.  Abdomen: Soft, nontender, bowel sounds present, no guarding or rebound.  Extremities: No pitting edema, distal pulses 2+.  Skin: Warm and dry.  Musculoskeletal: No kyphosis.  Neuropsychiatric: Alert and oriented x3, affect grossly appropriate.  ECG: I personally reviewed the tracing from 04/02/2015 which shows normal sinus rhythm with old right bundle-branch block and left anterior fascicular block.  Recent Labwork:  June 2016: Hemoglobin 11.8, platelet 177, potassium 3.4, BUN 34, creatinine 1.1,  hemoglobin A1c 7.0  Other Studies Reviewed Today:  Echocardiogram 04/07/2015: Study Conclusions  - Left ventricle: The cavity size was normal. There was mild   concentric hypertrophy. Systolic function was normal. The   estimated ejection fraction was in the range of 55% to 60%. Wall   motion was normal; there were no regional wall motion   abnormalities. Doppler parameters are consistent with abnormal   left ventricular relaxation (grade 1 diastolic dysfunction).   Doppler parameters are consistent with high ventricular filling   pressure. - Ventricular septum: Septal motion showed abnormal function and   dyssynergy. These changes are consistent with intraventricular   conduction delay. - Aortic valve: Normally functioning bioprosthetic valve. Peak   velocity (S): 236 cm/s. Mean gradient (S): 10 mm Hg. Valve area   (VTI): 0.99  cm^2. Valve area (Vmax): 0.91 cm^2. Valve area   (Vmean): 0.99 cm^2. - Mitral valve: Calcified annulus. Mildly thickened leaflets . - Right ventricle: Systolic function was mildly reduced.  Assessment and Plan:  1. Aortic stenosis status post bioprosthetic AVR in 2013. Recent follow-up echocardiogram shows normal LVEF and valve function.  2. History of cardiomyopathy with previous LVEF 35%, subsequently normalized. Plan to continue medical therapy.  3. Essential hypertension, blood pressure is adequately controlled today.  4. Hyperlipidemia, recent LDL 41. Zocor was cut back to 10 mg daily by Dr. Margo Aye.  Current medicines were reviewed with the patient today.  Disposition: Follow-up with me in 6 months in the [place] office.  Signed, Jonelle Sidle, MD, [facility] 02/03/2016 10:22 AM    [facility] at [facility] [address] [place], [place], [place] [address] Phone: [phone]; Fax: [phone]

## 2016-02-03 ENCOUNTER — Encounter: Payer: Self-pay | Admitting: Cardiology

## 2016-02-03 ENCOUNTER — Ambulatory Visit (INDEPENDENT_AMBULATORY_CARE_PROVIDER_SITE_OTHER): Payer: Medicare Other | Admitting: Cardiology

## 2016-02-03 ENCOUNTER — Ambulatory Visit (HOSPITAL_COMMUNITY)
Admission: RE | Admit: 2016-02-03 | Discharge: 2016-02-03 | Disposition: A | Discharge status: Home / Self Care | Payer: Medicare Other | Source: Ambulatory Visit | Attending: Internal Medicine | Admitting: Internal Medicine

## 2016-02-03 VITALS — BP 130/88 | HR 79 | Ht 64.0 in | Wt 218.0 lb

## 2016-02-03 DIAGNOSIS — I10 Essential (primary) hypertension: Secondary | ICD-10-CM

## 2016-02-03 DIAGNOSIS — I745 Embolism and thrombosis of iliac artery: Secondary | ICD-10-CM | POA: Diagnosis not present

## 2016-02-03 DIAGNOSIS — Z8679 Personal history of other diseases of the circulatory system: Secondary | ICD-10-CM | POA: Diagnosis not present

## 2016-02-03 DIAGNOSIS — Z954 Presence of other heart-valve replacement: Secondary | ICD-10-CM | POA: Diagnosis not present

## 2016-02-03 DIAGNOSIS — I999 Unspecified disorder of circulatory system: Secondary | ICD-10-CM

## 2016-02-03 DIAGNOSIS — I998 Other disorder of circulatory system: Secondary | ICD-10-CM | POA: Diagnosis not present

## 2016-02-03 DIAGNOSIS — E785 Hyperlipidemia, unspecified: Secondary | ICD-10-CM | POA: Diagnosis not present

## 2016-02-03 DIAGNOSIS — Z952 Presence of prosthetic heart valve: Secondary | ICD-10-CM

## 2016-02-03 NOTE — Patient Instructions (Signed)
Medication Instructions:  Continue all current medications.  Labwork: none  Testing/Procedures: none  Follow-Up: Your physician wants you to follow up in: 6 months.  You will receive a reminder letter in the mail one-two months in advance.  If you don't receive a letter, please call our office to schedule the follow up appointment - [place] office.   Any Other Special Instructions Will Be Listed Below (If Applicable).  If you need a refill on your cardiac medications before your next appointment, please call your pharmacy.

## 2016-03-03 ENCOUNTER — Other Ambulatory Visit: Payer: Self-pay | Admitting: Cardiology

## 2016-03-30 ENCOUNTER — Other Ambulatory Visit (HOSPITAL_COMMUNITY): Payer: Self-pay | Admitting: Internal Medicine

## 2016-03-30 DIAGNOSIS — Z1231 Encounter for screening mammogram for malignant neoplasm of breast: Secondary | ICD-10-CM

## 2016-04-15 ENCOUNTER — Ambulatory Visit (HOSPITAL_COMMUNITY)
Admission: RE | Admit: 2016-04-15 | Discharge: 2016-04-15 | Disposition: A | Discharge status: Home / Self Care | Payer: Medicare Other | Source: Ambulatory Visit | Attending: Internal Medicine | Admitting: Internal Medicine

## 2016-04-15 DIAGNOSIS — Z1231 Encounter for screening mammogram for malignant neoplasm of breast: Secondary | ICD-10-CM | POA: Insufficient documentation

## 2016-06-04 ENCOUNTER — Other Ambulatory Visit: Payer: Self-pay | Admitting: Cardiology

## 2016-08-13 ENCOUNTER — Ambulatory Visit (HOSPITAL_COMMUNITY)
Admission: RE | Admit: 2016-08-13 | Discharge: 2016-08-13 | Disposition: A | Discharge status: Home / Self Care | Payer: Medicare Other | Source: Ambulatory Visit | Attending: Adult Health Nurse Practitioner | Admitting: Adult Health Nurse Practitioner

## 2016-08-13 ENCOUNTER — Other Ambulatory Visit (HOSPITAL_COMMUNITY): Payer: Self-pay | Admitting: Adult Health Nurse Practitioner

## 2016-08-13 DIAGNOSIS — I700 Atherosclerosis of aorta: Secondary | ICD-10-CM | POA: Insufficient documentation

## 2016-08-13 DIAGNOSIS — R0902 Hypoxemia: Secondary | ICD-10-CM | POA: Insufficient documentation

## 2016-08-13 DIAGNOSIS — J811 Chronic pulmonary edema: Secondary | ICD-10-CM | POA: Insufficient documentation

## 2016-08-13 DIAGNOSIS — I517 Cardiomegaly: Secondary | ICD-10-CM | POA: Insufficient documentation

## 2016-10-01 ENCOUNTER — Telehealth: Payer: Self-pay | Admitting: Cardiology

## 2016-10-01 NOTE — Telephone Encounter (Signed)
Will forward to MD.  

## 2016-10-01 NOTE — Telephone Encounter (Signed)
Pt has been having swelling in her feet/ankles, her BP has been running high and her heart rate when up to 100--had CHF a couple of weeks ago would like someone to give her call to see if she needs to change her meds

## 2016-10-01 NOTE — Telephone Encounter (Signed)
Apt next week Tues 10/05/16 at 330 pm with Harriet Pho NP, pt will take extra lasix over weekend

## 2016-10-01 NOTE — Telephone Encounter (Signed)
I have not seen her since August 2017. She needs to have a follow-up visit to review these symptoms in more detail and determine what medication adjustments or testing might be needed. Please have her double her Lasix dose over the weekend and then schedule an office visit with APP next week. It would be useful if she tracks her heart rate and blood pressure by writing these down and bringing the information in for the visit.

## 2016-10-05 ENCOUNTER — Ambulatory Visit (INDEPENDENT_AMBULATORY_CARE_PROVIDER_SITE_OTHER): Payer: Medicare Other | Admitting: Adult Health

## 2016-10-05 ENCOUNTER — Encounter: Payer: Self-pay | Admitting: Adult Health

## 2016-10-05 ENCOUNTER — Ambulatory Visit (HOSPITAL_COMMUNITY)
Admission: RE | Admit: 2016-10-05 | Discharge: 2016-10-05 | Disposition: A | Discharge status: Home / Self Care | Payer: Medicare Other | Source: Ambulatory Visit | Attending: Adult Health | Admitting: Adult Health

## 2016-10-05 VITALS — BP 146/82 | HR 94 | Ht 63.0 in | Wt 207.0 lb

## 2016-10-05 DIAGNOSIS — I350 Nonrheumatic aortic (valve) stenosis: Secondary | ICD-10-CM | POA: Diagnosis not present

## 2016-10-05 DIAGNOSIS — R918 Other nonspecific abnormal finding of lung field: Secondary | ICD-10-CM | POA: Diagnosis not present

## 2016-10-05 DIAGNOSIS — I10 Essential (primary) hypertension: Secondary | ICD-10-CM

## 2016-10-05 DIAGNOSIS — Z952 Presence of prosthetic heart valve: Secondary | ICD-10-CM | POA: Insufficient documentation

## 2016-10-05 DIAGNOSIS — I5022 Chronic systolic (congestive) heart failure: Secondary | ICD-10-CM | POA: Insufficient documentation

## 2016-10-05 DIAGNOSIS — D649 Anemia, unspecified: Secondary | ICD-10-CM

## 2016-10-05 DIAGNOSIS — I517 Cardiomegaly: Secondary | ICD-10-CM | POA: Diagnosis not present

## 2016-10-05 DIAGNOSIS — J9811 Atelectasis: Secondary | ICD-10-CM | POA: Insufficient documentation

## 2016-10-05 NOTE — Progress Notes (Signed)
Cardiology Office Note   Date:  10/05/2016   ID:  Kathryn Morgan, DOB [birth year], MRN [id number]  PCP:  Dwana Melena, MD  Cardiologist:  Diona Browner, MD   Chief Complaint  Patient presents with  . Edema  . Hypertension      History of Present Illness: Kathryn Morgan is a 76 y.o. female who presents for ongoing assessment and management of aortic stenosis status post bioprosthetic aortic valve in 2013, hypertension, and hyperlipidemia. The patient was last seen by Dr. Diona Browner in the office on 02/03/2016 was clinically stable.  The patient called our office on 10/01/2016 with complaints of swelling in her feet and ankles, also stating that her blood pressure had been running higher than normal. She also reported that her heart rate was elevated to greater than 100 bpm. She is advised to make a follow-up appointment for assessment of her current status, and recommendations for medical management.  She states that she is retaining fluid. She was seen by PCP for worsening dyspnea and sent to hospital for CXR. This revealed pulmonary edema. She was increased on lasix to 40 mg BID. She states she is breathing better, with less edema. She is not adhering to low sodium diet.   Past Medical History:  Diagnosis Date  . Aortic stenosis    Status post 23mm [facility] Ease pericardial tissue valve  . Chronic kidney disease   . Chronic systolic heart failure (HCC)    LVEF 35%  . COPD (chronic obstructive pulmonary disease) (HCC)   . Depression   . Dyslipidemia   . Essential hypertension, benign   . Gallstone pancreatitis    2005  . GERD (gastroesophageal reflux disease)   . Necrotizing pancreatitis   . Neuropathy   . Osteoporosis   . Rhabdomyolysis   . Rheumatic fever   . Right bundle-branch block   . Type 2 diabetes mellitus (HCC)    Insulin pump    Past Surgical History:  Procedure Laterality Date  . AORTIC VALVE REPLACEMENT  08/27/2011   Procedure: AORTIC VALVE REPLACEMENT (AVR);   Surgeon: Purcell Nails, MD;  Location: [facility] OR;  Service: Open Heart Surgery;  Laterality: N/A;  . CHOLECYSTECTOMY    . Debridement pancreas    . ESOPHAGOGASTRODUODENOSCOPY  04/21/04   Normal esophagus/normal D1,D2; attempted  transgastric drainage/stent placement of pancreatic pseudocyst, no obvious location for gastrotomy, therefore transferred to [facility]  . LEFT AND RIGHT HEART CATHETERIZATION WITH CORONARY ANGIOGRAM N/A 06/03/2011   Procedure: LEFT AND RIGHT HEART CATHETERIZATION WITH CORONARY ANGIOGRAM;  Surgeon: Kathleene Hazel, MD;  Location: [facility] CATH LAB;  Service: Cardiovascular;  Laterality: N/A;  Right Heart Cath also  . PANCREATIC PSEUDOCYST DRAINAGE       Current Outpatient Prescriptions  Medication Sig Dispense Refill  . albuterol (PROVENTIL HFA;VENTOLIN HFA) 108 (90 BASE) MCG/ACT inhaler Inhale 2 puffs into the lungs every 6 (six) hours as needed for wheezing or shortness of breath.    Marland Kitchen aspirin EC 325 MG tablet Take 325 mg by mouth every morning.     . carvedilol (COREG) 12.5 MG tablet Take 1 tablet by mouth  twice a day with meals 180 tablet 0  . Clobetasol Prop Crea-Coal Tar (CLOBETA CREAM EX) Apply topically.    . docusate sodium (COLACE) 100 MG capsule Take 100 mg by mouth 2 (two) times daily.      . fish oil-omega-3 fatty acids 1000 MG capsule Take 2 g by mouth 2 (two) times daily.     Marland Kitchen  furosemide (LASIX) 40 MG tablet TAKE 1 TABLET BY MOUTH  EVERY MORNING 90 tablet 1  . Glycopyrrolate-Formoterol (BEVESPI AEROSPHERE) 9-4.8 MCG/ACT AERO     . Insulin Lispro, Human, (HUMALOG KWIKPEN [place]) Inject 10-15 Units into the skin 3 (three) times daily with meals. Per sliding scale    . losartan (COZAAR) 50 MG tablet Take 1 tablet by mouth  daily 90 tablet 3  . Magnesium 400 MG CAPS Take 1 capsule by mouth daily.    . metFORMIN (GLUCOPHAGE) 1000 MG tablet Take 1,000 mg by mouth 2 (two) times daily with a meal.     . Prenatal Vit-Fe Fumarate-FA (PRENATAL MULTIVITAMIN) TABS tablet  Take 1 tablet by mouth daily at 12 noon.    Marland Kitchen spironolactone (ALDACTONE) 25 MG tablet TAKE ONE-HALF TABLET BY  MOUTH DAILY 45 tablet 3  . TOUJEO SOLOSTAR 300 UNIT/ML SOPN      No current facility-administered medications for this visit.     Allergies:   Codeine; Promethazine hcl; Ace inhibitors; Adhesive [tape]; and Levofloxacin    Social History:  The patient  reports that she quit smoking about 35 years ago. Her smoking use included Cigarettes. She has a 45.00 pack-year smoking history. She has never used smokeless tobacco. She reports that she does not drink alcohol or use drugs.   Family History:  The patient's family history includes Heart attack in her father.    ROS: All other systems are reviewed and negative. Unless otherwise mentioned in H&P    PHYSICAL EXAM: VS:  BP (!) 146/82   Pulse 94   Ht 5\' 3"  (1.6 m)   Wt 207 lb (93.9 kg)   SpO2 93%   BMI 36.67 kg/m  , BMI Body mass index is 36.67 kg/m. GEN: Well nourished, well developed, in no acute distress  HEENT: normal  Neck: no JVD, carotid bruits, or masses Cardiac: RRR; 2/6  Holosystolic murmur radiating to the carotids. no rubs, or gallops,no edema  Respiratory: Bibasilar crackles.  GI: soft, nontender, nondistended, + BS MS: no deformity or atrophy  Skin: warm and dry, no rash Neuro:  Strength and sensation are intact Psych: euthymic mood, full affect   Recent Labs: No results found for requested labs within last 8760 hours.    Lipid Panel    Component Value Date/Time   CHOL  10/04/2009 0421    145        ATP III CLASSIFICATION:  <200     mg/dL   Desirable  161-096  mg/dL   Borderline High  >=045    mg/dL   High          TRIG 409 10/04/2009 0421   HDL 51 10/04/2009 0421   CHOLHDL 2.8 10/04/2009 0421   VLDL 28 10/04/2009 0421   LDLCALC  10/04/2009 0421    66        Total Cholesterol/HDL:CHD Risk Coronary Heart Disease Risk Table                     Men   Women  1/2 Average Risk   3.4   3.3   Average Risk       5.0   4.4  2 X Average Risk   9.6   7.1  3 X Average Risk  23.4   11.0        Use the calculated Patient Ratio above and the CHD Risk Table to determine the patient's CHD Risk.  ATP III CLASSIFICATION (LDL):  <100     mg/dL   Optimal  155-208  mg/dL   Near or Above                    Optimal  130-159  mg/dL   Borderline  022-336  mg/dL   High  >122     mg/dL   Very High      Wt Readings from Last 3 Encounters:  10/05/16 207 lb (93.9 kg)  02/03/16 218 lb (98.9 kg)  04/02/15 211 lb 9.6 oz (96 kg)      Other studies Reviewed: Echocardiogram 10\/31\2016. - Left ventricle: The cavity size was normal. There was mild   concentric hypertrophy. Systolic function was normal. The   estimated ejection fraction was in the range of 55% to 60%. Wall   motion was normal; there were no regional wall motion   abnormalities. Doppler parameters are consistent with abnormal   left ventricular relaxation (grade 1 diastolic dysfunction).   Doppler parameters are consistent with high ventricular filling   pressure. - Ventricular septum: Septal motion showed abnormal function and   dyssynergy. These changes are consistent with intraventricular   conduction delay. - Aortic valve: Normally functioning bioprosthetic valve. Peak   velocity (S): 236 cm/s. Mean gradient (S): 10 mm Hg. Valve area   (VTI): 0.99 cm^2. Valve area (Vmax): 0.91 cm^2. Valve area   (Vmean): 0.99 cm^2. - Mitral valve: Calcified annulus. Mildly thickened leaflets . - Right ventricle: Systolic function was mildly reduced.  CXR  08/13/2016 FINDINGS: Status post median sternotomy and valve replacement. The heart is enlarged, stable in configuration. Aortic arch calcification. There are Kerley B-lines and mild interstitial prominence consistent with mild interstitial pulmonary edema. No overt alveolar edema. No discrete consolidations or pleural effusions.  IMPRESSION: Cardiomegaly and mild pulmonary  edema.  Thoracic aorta atherosclerosis.  ASSESSMENT AND PLAN:  1. Recent hx of pulmonary edema:  She remains on lasix 40 mg BID. Will have BMET, CXR to evaluate kidney function and for evaluation of ongoing edema. Repeat echocardiogram for changes in LV function.   2. Bioprosthetic AoV: Placed in 2013 by Dr. Barry Dienes. Will repeat echo for evaluation.   3. Hypertension: Will increase spironolactone to 25 mg daily as BP in not well controlled. Once labs are reviewed, will make recommendations on lasix and losartan. Counseled on low sodium diet.   Current medicines are reviewed at length with the patient today.    Labs/ tests ordered today include:   Orders Placed This Encounter  Procedures  . DG Chest 2 View  . Basic Metabolic Panel (BMET)  . CBC with Differential  . EKG 12-Lead  . ECHOCARDIOGRAM COMPLETE     Disposition:   FU with one week for discussion of test results.   Signed, Joni Reining, NP  10/05/2016 4:48 PM    [facility] [address] [place], [place] [address] Phone: [phone]; Fax: [phone]

## 2016-10-05 NOTE — Patient Instructions (Addendum)
Your physician recommends that you schedule a follow-up appointment in: 1 Week   Your physician recommends that you continue on your current medications as directed. Please refer to the Current Medication list given to you today.  Your physician has requested that you have an echocardiogram. Echocardiography is a painless test that uses sound waves to create images of your heart. It provides your doctor with information about the size and shape of your heart and how well your heart's chambers and valves are working. This procedure takes approximately one hour. There are no restrictions for this procedure.  Your physician recommends that you return for lab work in: Today  If you need a refill on your cardiac medications before your next appointment, please call your pharmacy.  Have Chest X-Ray Done Today  Thank you for choosing [facility]!

## 2016-10-06 ENCOUNTER — Other Ambulatory Visit: Payer: Self-pay | Admitting: Adult Health

## 2016-10-07 LAB — CBC WITH DIFFERENTIAL/PLATELET
BASOS ABS: 0.1 10*3/uL (ref 0.0–0.2)
Basos: 1 %
EOS (ABSOLUTE): 0.2 10*3/uL (ref 0.0–0.4)
Eos: 3 %
Hematocrit: 38.7 % (ref 34.0–46.6)
Hemoglobin: 11.7 g/dL (ref 11.1–15.9)
IMMATURE GRANULOCYTES: 1 %
Immature Grans (Abs): 0 10*3/uL (ref 0.0–0.1)
Lymphocytes Absolute: 2.4 10*3/uL (ref 0.7–3.1)
Lymphs: 36 %
MCH: 25.6 pg — AB (ref 26.6–33.0)
MCHC: 30.2 g/dL — ABNORMAL LOW (ref 31.5–35.7)
MCV: 85 fL (ref 79–97)
MONOS ABS: 0.6 10*3/uL (ref 0.1–0.9)
Monocytes: 9 %
NEUTROS PCT: 50 %
Neutrophils Absolute: 3.3 10*3/uL (ref 1.4–7.0)
PLATELETS: 246 10*3/uL (ref 150–379)
RBC: 4.57 x10E6/uL (ref 3.77–5.28)
RDW: 17.1 % — AB (ref 12.3–15.4)
WBC: 6.5 10*3/uL (ref 3.4–10.8)

## 2016-10-07 LAB — BASIC METABOLIC PANEL
BUN / CREAT RATIO: 25 (ref 12–28)
BUN: 22 mg/dL (ref 8–27)
CHLORIDE: 98 mmol/L (ref 96–106)
CO2: 28 mmol/L (ref 18–29)
Calcium: 9.6 mg/dL (ref 8.7–10.3)
Creatinine, Ser: 0.88 mg/dL (ref 0.57–1.00)
GFR calc Af Amer: 75 mL/min/{1.73_m2} (ref >59)
GFR calc non Af Amer: 65 mL/min/{1.73_m2} (ref >59)
GLUCOSE: 145 mg/dL — AB (ref 65–99)
POTASSIUM: 4.2 mmol/L (ref 3.5–5.2)
Sodium: 142 mmol/L (ref 134–144)

## 2016-10-07 LAB — AMBIG ABBREV BMP8 DEFAULT

## 2016-10-11 ENCOUNTER — Ambulatory Visit (HOSPITAL_COMMUNITY)
Admission: RE | Admit: 2016-10-11 | Discharge: 2016-10-11 | Disposition: A | Discharge status: Home / Self Care | Payer: Medicare Other | Source: Ambulatory Visit | Attending: Adult Health | Admitting: Adult Health

## 2016-10-11 DIAGNOSIS — I350 Nonrheumatic aortic (valve) stenosis: Secondary | ICD-10-CM | POA: Diagnosis present

## 2016-10-11 DIAGNOSIS — I10 Essential (primary) hypertension: Secondary | ICD-10-CM | POA: Diagnosis not present

## 2016-10-11 DIAGNOSIS — I4510 Unspecified right bundle-branch block: Secondary | ICD-10-CM | POA: Diagnosis not present

## 2016-10-11 DIAGNOSIS — E119 Type 2 diabetes mellitus without complications: Secondary | ICD-10-CM | POA: Diagnosis not present

## 2016-10-11 DIAGNOSIS — J449 Chronic obstructive pulmonary disease, unspecified: Secondary | ICD-10-CM | POA: Insufficient documentation

## 2016-10-11 DIAGNOSIS — Z952 Presence of prosthetic heart valve: Secondary | ICD-10-CM | POA: Insufficient documentation

## 2016-10-11 DIAGNOSIS — E785 Hyperlipidemia, unspecified: Secondary | ICD-10-CM | POA: Diagnosis not present

## 2016-10-11 LAB — ECHOCARDIOGRAM COMPLETE
AO mean calculated velocity dopler: 165 cm/s
AOPV: 0.35 m/s
AOVTI: 56.7 cm
AV Area VTI index: 0.4 cm2/m2
AV Area mean vel: 0.94 cm2
AV Peak grad: 23 mmHg
AV VEL mean LVOT/AV: 0.37
AV pk vel: 239 cm/s
AV vel: 0.84
AVA: 0.84 cm2
AVAREAMEANVIN: 0.45 cm2/m2
AVAREAVTI: 0.89 cm2
AVG: 12 mmHg
CHL CUP AV PEAK INDEX: 0.43
CHL CUP AV VALUE AREA INDEX: 0.4
CHL CUP MV DEC (S): 246
CHL CUP RV SYS PRESS: 39 mmHg
CHL CUP TV REG PEAK VELOCITY: 280 cm/s
E decel time: 246 msec
E/e' ratio: 20.86
FS: 30 % (ref 28–44)
IVS/LV PW RATIO, ED: 0.98
LA ID, A-P, ES: 39 mm
LA diam end sys: 39 mm
LA vol: 60.8 mL
LADIAMINDEX: 1.87 cm/m2
LAVOLA4C: 53.9 mL
LAVOLIN: 29.1 mL/m2
LDCA: 2.54 cm2
LV SIMPSON'S DISK: 50
LVDIAVOL: 82 mL (ref 46–106)
LVDIAVOLIN: 39 mL/m2
LVEEAVG: 20.86
LVEEMED: 20.86
LVELAT: 4.79 cm/s
LVOT VTI: 18.7 cm
LVOT diameter: 18 mm
LVOT peak VTI: 0.33 cm
LVOT peak grad rest: 3 mmHg
LVOTPV: 83.6 cm/s
LVOTSV: 47 mL
LVSYSVOL: 41 mL
LVSYSVOLIN: 20 mL/m2
Lateral S' vel: 7.72 cm/s
MV pk A vel: 129 m/s
MVPG: 4 mmHg
MVPKEVEL: 99.9 m/s
PW: 12.6 mm — AB (ref 0.6–1.1)
Stroke v: 41 ml
TAPSE: 13.4 mm
TDI e' lateral: 4.79
TDI e' medial: 6.31
TRMAXVEL: 280 cm/s

## 2016-10-11 NOTE — Progress Notes (Signed)
*  PRELIMINARY RESULTS* Echocardiogram 2D Echocardiogram has been performed.  Stacey Drain 10/11/2016, 10:26 AM

## 2016-10-12 ENCOUNTER — Encounter: Payer: Self-pay | Admitting: Adult Health

## 2016-10-12 ENCOUNTER — Ambulatory Visit (INDEPENDENT_AMBULATORY_CARE_PROVIDER_SITE_OTHER): Payer: Medicare Other | Admitting: Adult Health

## 2016-10-12 VITALS — BP 118/64 | HR 88 | Wt 206.0 lb

## 2016-10-12 DIAGNOSIS — I350 Nonrheumatic aortic (valve) stenosis: Secondary | ICD-10-CM

## 2016-10-12 DIAGNOSIS — I5022 Chronic systolic (congestive) heart failure: Secondary | ICD-10-CM | POA: Diagnosis not present

## 2016-10-12 DIAGNOSIS — I10 Essential (primary) hypertension: Secondary | ICD-10-CM | POA: Diagnosis not present

## 2016-10-12 MED ORDER — FUROSEMIDE 20 MG PO TABS: 20.0000 mg | ORAL_TABLET | Freq: Two times a day (BID) | ORAL | 3 refills | Status: DC

## 2016-10-12 NOTE — Patient Instructions (Signed)
Medication Instructions:  Your physician recommends that you continue on your current medications as directed. Please refer to the Current Medication list given to you today.   Labwork: Your physician recommends that you return for lab work in: [employer] BEFORE NEXT VISIT IN 3 MONTS BMET  Testing/Procedures: Your physician has requested that you have an echocardiogram. Echocardiography is a painless test that uses sound waves to create images of your heart. It provides your doctor with information about the size and shape of your heart and how well your heart's chambers and valves are working. This procedure takes approximately one hour. There are no restrictions for this procedure. (just before next visit)     Follow-Up: Your physician recommends that you schedule a follow-up appointment in: 3 months    Any Other Special Instructions Will Be Listed Below (If Applicable). You have been referred to Dr. Delton See      If you need a refill on your cardiac medications before your next appointment, please call your pharmacy.  3

## 2016-10-12 NOTE — Progress Notes (Signed)
Cardiology Office Note   Date:  10/12/2016   ID:  Kathryn Morgan, DOB 02-14-1941, MRN [id number]  PCP:  Benita Stabile, MD  Cardiologist: [place]/  Joni Reining, NP   No chief complaint on file.     History of Present Illness: Kathryn Morgan is a 76 y.o. female who presents for ongoing assessment and management of aortic valve stenosis, status post bioprosthetic aortic valve in 2013, hypertension, and hyperlipidemia. The patient was last in the office on 10/05/2016 with complaints of lower extremity edema and elevated blood pressure. On last office visit she had been increased to Lasix 20 mg twice a day and had been feeling better. This was continued with follow-up BMET and chest x-ray to evaluate kidney function and for evaluation of CHF. Echocardiogram was also ordered to evaluate for changes in LV function. Spironolactone was increased to 25 mg daily as blood pressure was not well-controlled.  Echocardiogram:10/11/2016 Left ventricle: The cavity size was normal. Wall thickness was   normal. Systolic function was mildly reduced. The estimated   ejection fraction was in the range of 45% to 50%. Doppler   parameters are consistent with abnormal left ventricular   relaxation (grade 1 diastolic dysfunction). - Aortic valve: AV prosthesis is diffcult to see Peak and mean   gradients through the valve are 23 and 12 mm Hg respectively. - Right ventricle: Systolic function was mildly reduced. - Pulmonary arteries: PA peak pressure: 39 mm Hg (S).  Labs: Sodium 142, potassium 4.2, chloride 90, CO2 20, glucose 145, creatinine 0.80.           Hemoglobin 11.7, hematocrit 30.7, white blood cells 6.5, platelets 246.  CXR: FINDINGS:10/05/2016 Prior cardiac valve replacement. Cardiomegaly with normal pulmonary vascularity. Low lung volumes with mild basilar subsegmental atelectasis. Mild right middle lobe infiltrate cannot be excluded . No pleural effusion or pneumothorax.  IMPRESSION: 1.  Prior cardiac valve replacement. Stable cardiomegaly. No pulmonary venous congestion.  2. Low lung volumes with mild basilar atelectasis .Mild right middle lobe infiltrate cannot be excluded.  She states that she had forgotten to go up on her spironolactone dose from 12.5-25 mg daily until 2 days ago. Since doing so, she has done much better, her blood pressure is better controlled, and she is feeling better. In fact she states that she was able to take a walk and not have trouble breathing. She has continued to take her Lasix 20 mg twice a day for a total of 40 mg daily  Past Medical History:  Diagnosis Date  . Aortic stenosis    Status post 23mm [facility] Ease pericardial tissue valve  . Chronic kidney disease   . Chronic systolic heart failure (HCC)    LVEF 35%  . COPD (chronic obstructive pulmonary disease) (HCC)   . Depression   . Dyslipidemia   . Essential hypertension, benign   . Gallstone pancreatitis    2005  . GERD (gastroesophageal reflux disease)   . Necrotizing pancreatitis   . Neuropathy   . Osteoporosis   . Rhabdomyolysis   . Rheumatic fever   . Right bundle-branch block   . Type 2 diabetes mellitus (HCC)    Insulin pump    Past Surgical History:  Procedure Laterality Date  . AORTIC VALVE REPLACEMENT  08/27/2011   Procedure: AORTIC VALVE REPLACEMENT (AVR);  Surgeon: Purcell Nails, MD;  Location: [facility] OR;  Service: Open Heart Surgery;  Laterality: N/A;  . CHOLECYSTECTOMY    . Debridement pancreas    .  ESOPHAGOGASTRODUODENOSCOPY  04/21/04   Normal esophagus/normal D1,D2; attempted  transgastric drainage/stent placement of pancreatic pseudocyst, no obvious location for gastrotomy, therefore transferred to [facility]  . LEFT AND RIGHT HEART CATHETERIZATION WITH CORONARY ANGIOGRAM N/A 06/03/2011   Procedure: LEFT AND RIGHT HEART CATHETERIZATION WITH CORONARY ANGIOGRAM;  Surgeon: Kathleene Hazel, MD;  Location: [facility] CATH LAB;  Service: Cardiovascular;   Laterality: N/A;  Right Heart Cath also  . PANCREATIC PSEUDOCYST DRAINAGE       Current Outpatient Prescriptions  Medication Sig Dispense Refill  . albuterol (PROVENTIL HFA;VENTOLIN HFA) 108 (90 BASE) MCG/ACT inhaler Inhale 2 puffs into the lungs every 6 (six) hours as needed for wheezing or shortness of breath.    Marland Kitchen aspirin EC 325 MG tablet Take 325 mg by mouth every morning.     . carvedilol (COREG) 12.5 MG tablet Take 1 tablet by mouth  twice a day with meals 180 tablet 0  . Clobetasol Prop Crea-Coal Tar (CLOBETA CREAM EX) Apply topically.    . docusate sodium (COLACE) 100 MG capsule Take 100 mg by mouth 2 (two) times daily.      . fish oil-omega-3 fatty acids 1000 MG capsule Take 2 g by mouth 2 (two) times daily.     . Glycopyrrolate-Formoterol (BEVESPI AEROSPHERE) 9-4.8 MCG/ACT AERO     . Insulin Lispro, Human, (HUMALOG KWIKPEN [place]) Inject 10-15 Units into the skin 3 (three) times daily with meals. Per sliding scale    . losartan (COZAAR) 50 MG tablet Take 1 tablet by mouth  daily 90 tablet 3  . Magnesium 400 MG CAPS Take 1 capsule by mouth daily.    . metFORMIN (GLUCOPHAGE) 1000 MG tablet Take 1,000 mg by mouth 2 (two) times daily with a meal.     . Prenatal Vit-Fe Fumarate-FA (PRENATAL MULTIVITAMIN) TABS tablet Take 1 tablet by mouth daily at 12 noon.    Marland Kitchen spironolactone (ALDACTONE) 25 MG tablet TAKE ONE-HALF TABLET BY  MOUTH DAILY 45 tablet 3  . TOUJEO SOLOSTAR 300 UNIT/ML SOPN     . furosemide (LASIX) 20 MG tablet Take 1 tablet (20 mg total) by mouth 2 (two) times daily. 180 tablet 3   No current facility-administered medications for this visit.     Allergies:   Codeine; Promethazine hcl; Ace inhibitors; Adhesive [tape]; and Levofloxacin    Social History:  The patient  reports that she quit smoking about 35 years ago. Her smoking use included Cigarettes. She has a 45.00 pack-year smoking history. She has never used smokeless tobacco. She reports that she does not drink alcohol  or use drugs.   Family History:  The patient's family history includes Heart attack in her father.    ROS: All other systems are reviewed and negative. Unless otherwise mentioned in H&P    PHYSICAL EXAM: VS:  BP 118/64   Pulse 88   Wt 206 lb (93.4 kg)   SpO2 90%   BMI 36.49 kg/m  , BMI Body mass index is 36.49 kg/m. GEN: Well nourished, well developed, in no acute distress  HEENT: normal  Neck: no JVD, carotid bruits, or masses Cardiac: RRR; 1/6 systolic mirmur, rubs, or gallops,no edema  Respiratory:  clear to auscultation bilaterally, normal work of breathing GI: soft, nontender, nondistended, + BS MS: no deformity or atrophy  Skin: warm and dry, no rash Neuro:  Strength and sensation are intact Psych: euthymic mood, full affect  Recent Labs: 10/06/2016: BUN 22; Creatinine, Ser 0.88; Platelets 246; Potassium 4.2; Sodium 142  Lipid Panel    Component Value Date/Time   CHOL  10/04/2009 0421    145        ATP III CLASSIFICATION:  <200     mg/dL   Desirable  161-096  mg/dL   Borderline High  >=045    mg/dL   High          TRIG 409 10/04/2009 0421   HDL 51 10/04/2009 0421   CHOLHDL 2.8 10/04/2009 0421   VLDL 28 10/04/2009 0421   LDLCALC  10/04/2009 0421    66        Total Cholesterol/HDL:CHD Risk Coronary Heart Disease Risk Table                     Men   Women  1/2 Average Risk   3.4   3.3  Average Risk       5.0   4.4  2 X Average Risk   9.6   7.1  3 X Average Risk  23.4   11.0        Use the calculated Patient Ratio above and the CHD Risk Table to determine the patient's CHD Risk.        ATP III CLASSIFICATION (LDL):  <100     mg/dL   Optimal  811-914  mg/dL   Near or Above                    Optimal  130-159  mg/dL   Borderline  782-956  mg/dL   High  >213     mg/dL   Very High      Wt Readings from Last 3 Encounters:  10/12/16 206 lb (93.4 kg)  10/05/16 207 lb (93.9 kg)  02/03/16 218 lb (98.9 kg)     ASSESSMENT AND PLAN:  1. Chronic  systolic heart failure: Repeat echocardiogram revealed mildly diminished EF compared to prior echocardiogram, with an EF of 45% to 50% compared to 50-55% on last recording. Increased doses for spironolactone, and Lasix have helped her feel much better, blood pressure is much better controlled, and breathing status has improved. We'll continue current medication regimen as she is responding well to this. I have mentioned that we may need to consider repeat ischemic testing with her decrease in LV systolic function. She wishes to wait on that, continue medical management at this time. We'll see her again in 3 months with a repeat echocardiogram and BMET prior to office visit.   2. Hypertension: Blood pressure is improved from prior office visit. She will keep track of her blood pressures at home, I have advised her to use a arm cuff instead of  Wrist cuff as this is more accurate. She did bring her blood pressure machine with her and hers is reading much higher than ours. She has not changed the batteries in her machine in several years, and I've advised to do so.  3. Bioprosthetic aortic valve: Echocardiogram views found it difficult to assess AV prosthesis  gradients, however peak and mean gradients through the valve are 23 and 12 mm Hg respectively.we'll continue to follow, systolic murmur, high-pitched was auscultated on exam. She is asymptomatic actually breathing much better with reduced weight with increased doses of Lasix and spironolactone     Current medicines are reviewed at length with the patient today.    Labs/ tests ordered today include:   Orders Placed This Encounter  Procedures  . Basic Metabolic Panel (BMET)  . Ambulatory  referral to [facility]  . ECHOCARDIOGRAM COMPLETE     Disposition:   FU with 3 months with Dr. Diona Browner   Signed, @Mid  February and now@  10/12/2016 4:22 PM    [facility] [address] [place], [place] [address] Phone: [phone]; Fax: [phone]

## 2016-10-14 ENCOUNTER — Ambulatory Visit: Payer: Medicare Other | Admitting: Cardiology

## 2016-10-21 ENCOUNTER — Telehealth: Payer: Self-pay | Admitting: Adult Health

## 2016-10-21 MED ORDER — SPIRONOLACTONE 25 MG PO TABS: 12.5000 mg | ORAL_TABLET | Freq: Every day | ORAL | 3 refills | Status: DC

## 2016-10-21 NOTE — Telephone Encounter (Signed)
°*  STAT* If patient is at the pharmacy, call can be transferred to refill team.   1. Which medications need to be refilled? (please list name of each medication and dose if known)   spironolactone (ALDACTONE) 25 MG tablet [[id number]]    2. Which pharmacy/location (including street and city if local pharmacy) is medication to be sent to? [facility]    3. Do they need a 30 day or 90 day supply? 90 day

## 2016-10-31 ENCOUNTER — Other Ambulatory Visit: Payer: Self-pay | Admitting: Cardiology

## 2016-11-29 ENCOUNTER — Emergency Department (HOSPITAL_COMMUNITY): Payer: Medicare Other

## 2016-11-29 ENCOUNTER — Emergency Department (HOSPITAL_COMMUNITY)
Admission: EM | Admit: 2016-11-29 | Discharge: 2016-11-29 | Disposition: A | Discharge status: Home / Self Care | Payer: Medicare Other | Attending: Emergency Medicine | Admitting: Emergency Medicine

## 2016-11-29 ENCOUNTER — Encounter (HOSPITAL_COMMUNITY): Payer: Self-pay | Admitting: Emergency Medicine

## 2016-11-29 DIAGNOSIS — W228XXA Striking against or struck by other objects, initial encounter: Secondary | ICD-10-CM | POA: Insufficient documentation

## 2016-11-29 DIAGNOSIS — Z7984 Long term (current) use of oral hypoglycemic drugs: Secondary | ICD-10-CM | POA: Insufficient documentation

## 2016-11-29 DIAGNOSIS — I5022 Chronic systolic (congestive) heart failure: Secondary | ICD-10-CM | POA: Insufficient documentation

## 2016-11-29 DIAGNOSIS — S9031XA Contusion of right foot, initial encounter: Secondary | ICD-10-CM | POA: Insufficient documentation

## 2016-11-29 DIAGNOSIS — I130 Hypertensive heart and chronic kidney disease with heart failure and stage 1 through stage 4 chronic kidney disease, or unspecified chronic kidney disease: Secondary | ICD-10-CM | POA: Insufficient documentation

## 2016-11-29 DIAGNOSIS — Z87891 Personal history of nicotine dependence: Secondary | ICD-10-CM | POA: Diagnosis not present

## 2016-11-29 DIAGNOSIS — Z79899 Other long term (current) drug therapy: Secondary | ICD-10-CM | POA: Diagnosis not present

## 2016-11-29 DIAGNOSIS — Y999 Unspecified external cause status: Secondary | ICD-10-CM | POA: Insufficient documentation

## 2016-11-29 DIAGNOSIS — S99921A Unspecified injury of right foot, initial encounter: Secondary | ICD-10-CM | POA: Diagnosis present

## 2016-11-29 DIAGNOSIS — Z7982 Long term (current) use of aspirin: Secondary | ICD-10-CM | POA: Diagnosis not present

## 2016-11-29 DIAGNOSIS — N189 Chronic kidney disease, unspecified: Secondary | ICD-10-CM | POA: Diagnosis not present

## 2016-11-29 DIAGNOSIS — J449 Chronic obstructive pulmonary disease, unspecified: Secondary | ICD-10-CM | POA: Diagnosis not present

## 2016-11-29 DIAGNOSIS — Y939 Activity, unspecified: Secondary | ICD-10-CM | POA: Diagnosis not present

## 2016-11-29 DIAGNOSIS — Y92009 Unspecified place in unspecified non-institutional (private) residence as the place of occurrence of the external cause: Secondary | ICD-10-CM | POA: Diagnosis not present

## 2016-11-29 DIAGNOSIS — E119 Type 2 diabetes mellitus without complications: Secondary | ICD-10-CM | POA: Insufficient documentation

## 2016-11-29 NOTE — ED Provider Notes (Signed)
  Medical screening examination/treatment/procedure(s) were conducted as a shared visit with non-physician practitioner(s) and myself.  I personally evaluated the patient during the encounter.   EKG Interpretation None      Pt with right foot injury.  pe tender proximal right achilles tendon   Bethann Berkshire, MD 11/29/16 1358

## 2016-11-29 NOTE — Discharge Instructions (Signed)
Wear the boot as needed for walking or standing.  Elevate your foot off the bed at night and apply ice packs on/off.  Call the foot doctor listed to arrange a follow-up appt

## 2016-11-29 NOTE — ED Triage Notes (Signed)
Patient states she hit her right heel on a door 4 days ago. States "I can't hardly walk because of the pain."

## 2016-12-08 NOTE — ED Provider Notes (Signed)
[facility]-EMERGENCY DEPT Provider Note   CSN: [id number] Arrival date & time: 11/29/16  1129     History   Chief Complaint Chief Complaint  Patient presents with  . Foot Injury    HPI Kathryn Morgan is a 76 y.o. female.  HPI  CIDNEY Morgan is a 76 y.o. female who presents to the Emergency Department complaining of right heel pain for 4 days.  She states that the storm door of her home struck the back of her foot.  She describes a throbbing pain to her heel that is worse with weight bearing and flexion of the foot.  Pain improves slightly with tylenol.  She denies open wound, calf pain, redness and numbness.    Past Medical History:  Diagnosis Date  . Aortic stenosis    Status post 23mm [facility] Ease pericardial tissue valve  . Chronic kidney disease   . Chronic systolic heart failure (HCC)    LVEF 35%  . COPD (chronic obstructive pulmonary disease) (HCC)   . Depression   . Dyslipidemia   . Essential hypertension, benign   . Gallstone pancreatitis    2005  . GERD (gastroesophageal reflux disease)   . Necrotizing pancreatitis   . Neuropathy   . Osteoporosis   . Rhabdomyolysis   . Rheumatic fever   . Right bundle-branch block   . Type 2 diabetes mellitus (HCC)    Insulin pump    Patient Active Problem List   Diagnosis Date Noted  . Salivary gland infections 11/25/2014  . Salivary gland abscess 11/25/2014  . Type 2 diabetes mellitus (HCC) 11/25/2014  . Chronic kidney disease 11/25/2014  . RBBB 07/31/2012  . Essential hypertension, benign 01/07/2012  . S/P aortic valve replacement 08/27/2011  . AS (aortic stenosis) 08/23/2011  . Obesity (BMI 30-39.9) 08/10/2011  . Osteoporosis 08/10/2011  . Mixed hyperlipidemia 07/06/2011  . 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 DM (diabetes mellitus), type 2 (HCC) 05/30/2011  . COPD (chronic obstructive pulmonary disease) (HCC) 05/30/2011    Past Surgical History:  Procedure Laterality Date  . AORTIC VALVE  REPLACEMENT  08/27/2011   Procedure: AORTIC VALVE REPLACEMENT (AVR);  Surgeon: Purcell Nails, MD;  Location: [facility] OR;  Service: Open Heart Surgery;  Laterality: N/A;  . CHOLECYSTECTOMY    . Debridement pancreas    . ESOPHAGOGASTRODUODENOSCOPY  04/21/04   Normal esophagus/normal D1,D2; attempted  transgastric drainage/stent placement of pancreatic pseudocyst, no obvious location for gastrotomy, therefore transferred to [facility]  . LEFT AND RIGHT HEART CATHETERIZATION WITH CORONARY ANGIOGRAM N/A 06/03/2011   Procedure: LEFT AND RIGHT HEART CATHETERIZATION WITH CORONARY ANGIOGRAM;  Surgeon: Kathleene Hazel, MD;  Location: [facility] CATH LAB;  Service: Cardiovascular;  Laterality: N/A;  Right Heart Cath also  . PANCREATIC PSEUDOCYST DRAINAGE      OB History    No data available       Home Medications    Prior to Admission medications   Medication Sig Start Date End Date Taking? Authorizing Provider  albuterol (PROVENTIL HFA;VENTOLIN HFA) 108 (90 BASE) MCG/ACT inhaler Inhale 2 puffs into the lungs every 6 (six) hours as needed for wheezing or shortness of breath.    [provider]  aspirin EC 325 MG tablet Take 325 mg by mouth every morning.     [provider]  carvedilol (COREG) 12.5 MG tablet Take 1 tablet by mouth  twice a day with meals 12/17/14   Jonelle Sidle, MD  Clobetasol Prop Crea-Coal Tar (CLOBETA CREAM EX)  Apply topically.    [provider]  docusate sodium (COLACE) 100 MG capsule Take 100 mg by mouth 2 (two) times daily.      [provider]  fish oil-omega-3 fatty acids 1000 MG capsule Take 2 g by mouth 2 (two) times daily.     [provider]  furosemide (LASIX) 20 MG tablet Take 1 tablet (20 mg total) by mouth 2 (two) times daily. 10/12/16 01/10/17  Jodelle Gross, NP  Glycopyrrolate-Formoterol (BEVESPI AEROSPHERE) 9-4.8 MCG/ACT AERO  03/15/16   [provider]  Insulin Lispro, Human, (HUMALOG KWIKPEN [place]) Inject 10-15  Units into the skin 3 (three) times daily with meals. Per sliding scale    [provider]  losartan (COZAAR) 50 MG tablet TAKE 1 TABLET BY MOUTH  DAILY 11/02/16   Jonelle Sidle, MD  Magnesium 400 MG CAPS Take 1 capsule by mouth daily.    [provider]  metFORMIN (GLUCOPHAGE) 1000 MG tablet Take 1,000 mg by mouth 2 (two) times daily with a meal.  02/05/15   [provider]  Prenatal Vit-Fe Fumarate-FA (PRENATAL MULTIVITAMIN) TABS tablet Take 1 tablet by mouth daily at 12 noon.    [provider]  spironolactone (ALDACTONE) 25 MG tablet Take 0.5 tablets (12.5 mg total) by mouth daily. 10/21/16   Jonelle Sidle, MD  TOUJEO SOLOSTAR 300 UNIT/ML [facility]  03/12/15   [provider]    Family History Family History  Problem Relation Age of Onset  . Heart attack Father   . Heart disease Unknown        CHF  . Colon cancer Neg Hx   . Anesthesia problems Neg Hx     Social History Social History  Substance Use Topics  . Smoking status: Former Smoker    Packs/day: 3.00    Years: 15.00    Types: Cigarettes    Quit date: 06/09/1981  . Smokeless tobacco: Never Used     Comment: quit about 20+ yrs ago  . Alcohol use No     Allergies   Codeine; Promethazine hcl; Ace inhibitors; Adhesive [tape]; and Levofloxacin   Review of Systems Review of Systems  Constitutional: Negative for chills and fever.  Musculoskeletal: Positive for arthralgias (right heel pain). Negative for joint swelling.  Skin: Negative for color change and wound.  Neurological: Negative for weakness and numbness.  All other systems reviewed and are negative.    Physical Exam Updated Vital Signs BP (!) 152/65 (BP Location: Right Arm)   Pulse 73   Temp 98.2 F (36.8 C) (Oral)   Resp 18   Ht 5' 3.5" (1.613 m)   Wt 92.5 kg (204 lb)   SpO2 91%   BMI 35.57 kg/m   Physical Exam  Constitutional: She is oriented to person, place, and time. She appears well-developed and  well-nourished. No distress.  HENT:  Head: Normocephalic and atraumatic.  Cardiovascular: Normal rate, regular rhythm and intact distal pulses.   Pulmonary/Chest: Effort normal and breath sounds normal.  Musculoskeletal: She exhibits tenderness. She exhibits no edema or deformity.  ttp of posterior right heel and proximal Achilles tendon. no step off of the tendon.  No erythema, abrasion, bruising or bony deformity.  No proximal tenderness. Compartments soft.  Neurological: She is alert and oriented to person, place, and time. No sensory deficit. She exhibits normal muscle tone. Coordination normal.  Skin: Skin is warm and dry. Capillary refill takes less than 2 seconds. No erythema.  Nursing note and  vitals reviewed.    ED Treatments / Results  Labs (all labs ordered are listed, but only abnormal results are displayed) Labs Reviewed - No data to display  EKG  EKG Interpretation None       Radiology Dg Foot Complete Right  Result Date: 11/29/2016 CLINICAL DATA:  Achilles tendon pain RIGHT foot, foot got shut in screen door 3-4 days ago EXAM: RIGHT FOOT COMPLETE - 3+ VIEW COMPARISON:  None FINDINGS: Diffuse osseous demineralization. Joint spaces preserved. Minimal soft tissue calcification adjacent medial malleolus, appears dystrophic and old. Small Achilles insertion calcaneal spurs. No acute fracture, dislocation or bone destruction. IMPRESSION: No acute osseous abnormalities. Electronically Signed   By: Ulyses Southward M.D.   On: 11/29/2016 12:41     Procedures Procedures (including critical care time)  Medications Ordered in ED Medications - No data to display   Initial Impression / Assessment and Plan / ED Course  I have reviewed the triage vital signs and the nursing notes.  Pertinent labs & imaging results that were available during my care of the patient were reviewed by me and considered in my medical decision making (see chart for details).     XR neg.  Likely  contusion, doubt Achilles tendon rupture.  NV intact.     Pt also seen by Dr. Estell Harpin and care plan discussed  Cam walker applied,  Pt agrees to symptomatic tx and PCP f/u if needed.  Final Clinical Impressions(s) / ED Diagnoses   Final diagnoses:  Contusion of right heel, initial encounter    New Prescriptions Discharge Medication List as of 11/29/2016  1:42 PM       Pauline Aus, PA-C 12/08/16 1255    Bethann Berkshire, MD 12/08/16 2317

## 2017-01-05 ENCOUNTER — Other Ambulatory Visit: Payer: Self-pay | Admitting: *Deleted

## 2017-01-05 MED ORDER — SPIRONOLACTONE 25 MG PO TABS: 12.5000 mg | ORAL_TABLET | Freq: Every day | ORAL | 3 refills | Status: DC

## 2017-01-05 MED ORDER — FUROSEMIDE 20 MG PO TABS: 20.0000 mg | ORAL_TABLET | Freq: Two times a day (BID) | ORAL | 3 refills | Status: DC

## 2017-01-10 ENCOUNTER — Other Ambulatory Visit: Payer: Self-pay | Admitting: *Deleted

## 2017-01-10 ENCOUNTER — Ambulatory Visit (HOSPITAL_COMMUNITY)
Admission: RE | Admit: 2017-01-10 | Discharge: 2017-01-10 | Disposition: A | Discharge status: Home / Self Care | Payer: Medicare Other | Source: Ambulatory Visit | Attending: Adult Health | Admitting: Adult Health

## 2017-01-10 ENCOUNTER — Other Ambulatory Visit (HOSPITAL_COMMUNITY)
Admission: RE | Admit: 2017-01-10 | Discharge: 2017-01-10 | Disposition: A | Discharge status: Home / Self Care | Payer: Medicare Other | Source: Ambulatory Visit | Attending: Adult Health | Admitting: Adult Health

## 2017-01-10 DIAGNOSIS — I10 Essential (primary) hypertension: Secondary | ICD-10-CM | POA: Insufficient documentation

## 2017-01-10 DIAGNOSIS — I350 Nonrheumatic aortic (valve) stenosis: Secondary | ICD-10-CM | POA: Diagnosis not present

## 2017-01-10 DIAGNOSIS — I348 Other nonrheumatic mitral valve disorders: Secondary | ICD-10-CM | POA: Insufficient documentation

## 2017-01-10 LAB — BASIC METABOLIC PANEL
Anion gap: 9 (ref 5–15)
BUN: 20 mg/dL (ref 6–20)
CALCIUM: 9.8 mg/dL (ref 8.9–10.3)
CO2: 35 mmol/L — AB (ref 22–32)
CREATININE: 0.87 mg/dL (ref 0.44–1.00)
Chloride: 99 mmol/L — ABNORMAL LOW (ref 101–111)
GFR calc non Af Amer: >60 mL/min (ref >60)
Glucose, Bld: 102 mg/dL — ABNORMAL HIGH (ref 65–99)
Potassium: 4.4 mmol/L (ref 3.5–5.1)
SODIUM: 143 mmol/L (ref 135–145)

## 2017-01-10 MED ORDER — FUROSEMIDE 40 MG PO TABS: 40.0000 mg | ORAL_TABLET | Freq: Two times a day (BID) | ORAL | 6 refills | Status: DC

## 2017-01-10 MED ORDER — SPIRONOLACTONE 25 MG PO TABS: 25.0000 mg | ORAL_TABLET | Freq: Every day | ORAL | 3 refills | Status: DC

## 2017-01-10 MED ORDER — FUROSEMIDE 40 MG PO TABS: 40.0000 mg | ORAL_TABLET | Freq: Two times a day (BID) | ORAL | 3 refills | Status: DC

## 2017-01-10 NOTE — Progress Notes (Signed)
*  PRELIMINARY RESULTS* Echocardiogram 2D Echocardiogram has been performed.  Kathryn Morgan 01/10/2017, 2:09 PM

## 2017-01-13 ENCOUNTER — Encounter: Payer: Self-pay | Admitting: *Deleted

## 2017-01-14 ENCOUNTER — Encounter: Payer: Self-pay | Admitting: Adult Health

## 2017-01-14 ENCOUNTER — Ambulatory Visit (INDEPENDENT_AMBULATORY_CARE_PROVIDER_SITE_OTHER): Payer: Medicare Other | Admitting: Adult Health

## 2017-01-14 VITALS — BP 128/76 | HR 85 | Ht 63.5 in | Wt 204.0 lb

## 2017-01-14 DIAGNOSIS — E7800 Pure hypercholesterolemia, unspecified: Secondary | ICD-10-CM

## 2017-01-14 DIAGNOSIS — I5042 Chronic combined systolic (congestive) and diastolic (congestive) heart failure: Secondary | ICD-10-CM

## 2017-01-14 DIAGNOSIS — I10 Essential (primary) hypertension: Secondary | ICD-10-CM

## 2017-01-14 DIAGNOSIS — Z952 Presence of prosthetic heart valve: Secondary | ICD-10-CM

## 2017-01-14 MED ORDER — LOSARTAN POTASSIUM 50 MG PO TABS: 50.0000 mg | ORAL_TABLET | Freq: Every day | ORAL | 3 refills | Status: DC

## 2017-01-14 MED ORDER — SPIRONOLACTONE 25 MG PO TABS: 25.0000 mg | ORAL_TABLET | Freq: Every day | ORAL | 0 refills | Status: DC

## 2017-01-14 MED ORDER — CARVEDILOL 12.5 MG PO TABS: 12.5000 mg | ORAL_TABLET | Freq: Two times a day (BID) | ORAL | 3 refills | Status: DC

## 2017-01-14 MED ORDER — FUROSEMIDE 40 MG PO TABS
40.0000 mg | ORAL_TABLET | Freq: Two times a day (BID) | ORAL | 3 refills | Status: DC
Start: 2017-01-14 — End: 2019-04-20

## 2017-01-14 NOTE — Patient Instructions (Signed)

## 2017-01-14 NOTE — Progress Notes (Signed)
Cardiology Office Note   Date:  01/14/2017   ID:  JOHNELLE TAFOLLA, DOB [birth year], MRN [id number]  PCP:  Benita Stabile, MD  Cardiologist:  Diona Browner Chief Complaint  Patient presents with  . Congestive Heart Failure  . Cardiac Valve Problem      History of Present Illness: LATERRIA LASOTA is a 76 y.o. female who presents for ongoing assessment and management of aortic valve stenosis, history of bioprosthetic aortic valve placement in 2013, hypertension, hyperlipidemia. The patient was last seen in the office on 10/12/2016. At that time discussion of echocardiogram was completed revealing a systolic function of 45-50% with aortic valve prosthesis difficult to see,  I last office visit she had forgotten to take spironolactone as directed, but when she began taking it regularly her blood pressure remained controlled. She was continued on her current medication regimen without changes. She was to keep up with blood pressure at home.  She comes today without cardiac complaints. She Has injured her right Achilles tendon after getting it caught in a newly hung storm door that was quite heavy. The patient had been in a wellchair, has progressed to a walker., And now is ambulating without assistant.  Past Medical History:  Diagnosis Date  . Aortic stenosis    Status post 23mm [place] Ease pericardial tissue valve  . Chronic kidney disease   . Chronic systolic heart failure (HCC)    LVEF 35%  . COPD (chronic obstructive pulmonary disease) (HCC)   . Depression   . Dyslipidemia   . Essential hypertension, benign   . Gallstone pancreatitis    2005  . GERD (gastroesophageal reflux disease)   . Necrotizing pancreatitis   . Neuropathy   . Osteoporosis   . Rhabdomyolysis   . Rheumatic fever   . Right bundle-branch block   . Type 2 diabetes mellitus (HCC)    Insulin pump    Past Surgical History:  Procedure Laterality Date  . AORTIC VALVE REPLACEMENT  08/27/2011   Procedure: AORTIC  VALVE REPLACEMENT (AVR);  Surgeon: Purcell Nails, MD;  Location: [facility] OR;  Service: Open Heart Surgery;  Laterality: N/A;  . CHOLECYSTECTOMY    . Debridement pancreas    . ESOPHAGOGASTRODUODENOSCOPY  04/21/04   Normal esophagus/normal D1,D2; attempted  transgastric drainage/stent placement of pancreatic pseudocyst, no obvious location for gastrotomy, therefore transferred to [facility]  . LEFT AND RIGHT HEART CATHETERIZATION WITH CORONARY ANGIOGRAM N/A 06/03/2011   Procedure: LEFT AND RIGHT HEART CATHETERIZATION WITH CORONARY ANGIOGRAM;  Surgeon: Kathleene Hazel, MD;  Location: [facility] CATH LAB;  Service: Cardiovascular;  Laterality: N/A;  Right Heart Cath also  . PANCREATIC PSEUDOCYST DRAINAGE       Current Outpatient Prescriptions  Medication Sig Dispense Refill  . albuterol (PROVENTIL HFA;VENTOLIN HFA) 108 (90 BASE) MCG/ACT inhaler Inhale 2 puffs into the lungs every 6 (six) hours as needed for wheezing or shortness of breath.    Marland Kitchen aspirin EC 325 MG tablet Take 325 mg by mouth every morning.     . carvedilol (COREG) 12.5 MG tablet Take 1 tablet by mouth  twice a day with meals 180 tablet 0  . Clobetasol Prop Crea-Coal Tar (CLOBETA CREAM EX) Apply topically.    . docusate sodium (COLACE) 100 MG capsule Take 100 mg by mouth 2 (two) times daily.      . fish oil-omega-3 fatty acids 1000 MG capsule Take 2 g by mouth 2 (two) times daily.     . furosemide (LASIX) 40 MG  tablet Take 1 tablet (40 mg total) by mouth 2 (two) times daily. 180 tablet 3  . Glycopyrrolate-Formoterol (BEVESPI AEROSPHERE) 9-4.8 MCG/ACT AERO     . Insulin Lispro, Human, (HUMALOG KWIKPEN [place]) Inject 10-15 Units into the skin 3 (three) times daily with meals. Per sliding scale    . losartan (COZAAR) 50 MG tablet TAKE 1 TABLET BY MOUTH  DAILY 90 tablet 3  . Magnesium 400 MG CAPS Take 1 capsule by mouth daily.    . metFORMIN (GLUCOPHAGE) 1000 MG tablet Take 1,000 mg by mouth 2 (two) times daily with a meal.     . Prenatal Vit-Fe  Fumarate-FA (PRENATAL MULTIVITAMIN) TABS tablet Take 1 tablet by mouth daily at 12 noon.    Marland Kitchen spironolactone (ALDACTONE) 25 MG tablet Take 1 tablet (25 mg total) by mouth daily. 90 tablet 3  . TOUJEO SOLOSTAR 300 UNIT/ML SOPN      No current facility-administered medications for this visit.     Allergies:   Codeine; Promethazine hcl; Ace inhibitors; Adhesive [tape]; and Levofloxacin    Social History:  The patient  reports that she quit smoking about 35 years ago. Her smoking use included Cigarettes. She has a 45.00 pack-year smoking history. She has never used smokeless tobacco. She reports that she does not drink alcohol or use drugs.   Family History:  The patient's family history includes Heart attack in her father; Heart disease in her unknown relative.    ROS: All other systems are reviewed and negative. Unless otherwise mentioned in H&P    PHYSICAL EXAM: VS:  BP 128/76 (BP Location: Right Arm)   Pulse 85   Ht 5' 3.5" (1.613 m)   Wt 204 lb (92.5 kg)   SpO2 91%   BMI 35.57 kg/m  , BMI Body mass index is 35.57 kg/m. GEN: Well nourished, well developed, in no acute distress obese HEENT: normal  Neck: no JVD, carotid bruits, or masses Cardiac: RRR; `1/6 systolic murmur, , rubs, or gallops,no edema  Respiratory:  clear to auscultation bilaterally, normal work of breathing GI: soft, nontender, nondistended, + BS MS: no deformity or atrophy  Skin: warm and dry, no rash Neuro:  Strength and sensation are intact Psych: euthymic mood, full affect  Recent Labs: 10/06/2016: Hemoglobin 11.7; Platelets 246 01/10/2017: BUN 20; Creatinine, Ser 0.87; Potassium 4.4; Sodium 143    Lipid Panel    Component Value Date/Time   CHOL  10/04/2009 0421    145        ATP III CLASSIFICATION:  <200     mg/dL   Desirable  553-748  mg/dL   Borderline High  >=270    mg/dL   High          TRIG 786 10/04/2009 0421   HDL 51 10/04/2009 0421   CHOLHDL 2.8 10/04/2009 0421   VLDL 28 10/04/2009 0421    LDLCALC  10/04/2009 0421    66        Total Cholesterol/HDL:CHD Risk Coronary Heart Disease Risk Table                     Men   Women  1/2 Average Risk   3.4   3.3  Average Risk       5.0   4.4  2 X Average Risk   9.6   7.1  3 X Average Risk  23.4   11.0        Use the calculated Patient Ratio above and the  CHD Risk Table to determine the patient's CHD Risk.        ATP III CLASSIFICATION (LDL):  <100     mg/dL   Optimal  161-096  mg/dL   Near or Above                    Optimal  130-159  mg/dL   Borderline  045-409  mg/dL   High  >811     mg/dL   Very High      Wt Readings from Last 3 Encounters:  01/14/17 204 lb (92.5 kg)  11/29/16 204 lb (92.5 kg)  10/12/16 206 lb (93.4 kg)      Other studies Reviewed: Echocardiogram 01-30-17 Left ventricle: Inferior wall hypokinesis. The cavity size was   normal. Wall thickness was normal. Systolic function was mildly   to moderately reduced. The estimated ejection fraction was in the   range of 40% to 45%. Doppler parameters are consistent with   abnormal left ventricular relaxation (grade 1 diastolic   dysfunction). - Aortic valve: Bioprosthetic tissue valve with no peri valvular   regurgitation and gradients similar to May of this year. Valve   area (VTI): 0.87 cm^2. Valve area (Vmax): 0.87 cm^2. Valve area   (Vmean): 0.77 cm^2. - Mitral valve: Calcified annulus. - Atrial septum: No defect or patent foramen ovale was identified.  Labs dated Jan 30, 2017: Sodium 143, potassium 4.4, chloride 99, CO2 35, glucose 102, BUN 20, creatinine 0.87.  ASSESSMENT AND PLAN:  1.  Chronic mixed CHF. Most recent echocardiogram revealed slightly reduced EF compared to prior EF in May. EF in May revealed 45-50% LV systolic function, with most recent echo at 40-45%. The patient's aortic valve showed no perivalvular regurgitation. Her gradients were similar to that which was measured in May 2018.  She stopped taking her Lasix twice a day for  approximately 2 weeks because of injury to her foot and she cut back on her fluids to avoid getting up so often to have to use the bathroom on her injured foot. She is back on her normal regimen. Weight is controlled. Multiple questions are answered. She is given refills on spironolactone, carvedilol, losartan, and Lasix.  2. Hypertension: Blood pressure is controlled, would like to see a little lower with newly reduced EF of 40-45%. She is advised on a low-sodium diet. She verbalizes understanding. She will continue current medication regimen. If she begins to gain fluid weight how palpitations or chest pain she is to call us.  3. Hypercholesterolemia: The patient is not tolerant of statins, but will continue on her fish oil. Like to start her back on a low regimen for protective purposes that she would like to avoid this at this time and she is to eat a low-cholesterol diet. Labs are per primary care.   Current medicines are reviewed at length with the patient today.    Labs/ tests ordered today include:  Bettey Mare. Liborio Nixon, ANP, AACC   01/14/2017 1:19 PM    [facility] [address] [place], [place] [address] Phone: [phone]; Fax: [phone]

## 2017-03-21 ENCOUNTER — Telehealth (HOSPITAL_COMMUNITY): Payer: Self-pay | Admitting: Internal Medicine

## 2017-03-21 NOTE — Telephone Encounter (Signed)
03/21/17  pt left a message to cx - no reason was given

## 2017-03-22 ENCOUNTER — Ambulatory Visit (HOSPITAL_COMMUNITY): Payer: Medicare Other | Admitting: Physical Therapy

## 2017-04-11 ENCOUNTER — Other Ambulatory Visit: Payer: Self-pay

## 2017-04-11 MED ORDER — LOSARTAN POTASSIUM 50 MG PO TABS: 50.0000 mg | ORAL_TABLET | Freq: Every day | ORAL | 3 refills | Status: DC

## 2017-04-11 NOTE — Telephone Encounter (Signed)
losartan e-scribed to [facility] per fax request

## 2017-05-18 ENCOUNTER — Other Ambulatory Visit: Payer: Self-pay | Admitting: Pharmacist

## 2017-05-18 NOTE — Patient Outreach (Signed)
Outreach call to Kathryn Morgan regarding her request for follow up from the [facility] Medication Adherence Campaign.  [name] reports that she has been taking her simvastatin once daily as directed. Reports that she had previously been off of the medication for about a year, but was restarted on it a couple of months ago. Denies missed doses or barriers to adherence.  Patient reports difficulty with affording her Entresto. Reports that her PCP has just started her on this medication. Reports that he has given her samples to start, which she is currently using, but that she is unable to afford her tier 3 copayment for this medication. Patient confirms that she stopped her losartan prior to starting on Entresto as directed. Review with patient the requirements for the patient assistance program for [facility] through [employer]. Patient reports that she meets the requirements. Call [employer] with patient on the line and speak with [facility]. Kandee Keen completes a prescreen for eligibility with the patient. Patient reports that based on the prescreen, [name] is eligible to receive a three month supply of Sherryll Burger now while she and her provider are working on completing the paperwork for the program for 2019. Kandee Keen states that in order for the patient to receive this 90 day supply, the patient's provider will just need to fax a prescription for the [facility] into [employer] (fax # [phone]). Kandee Keen states that he will also now mail the full application to both the patient and provider to be completed.   Patient denies any further medication questions/concerns at this time. Provide patient with my phone number as well as the phone and fax number for [employer]. Let [name] know that one of our Pharmacy Technicians will be reaching out to her.  PLAN  1) Will ask Pharmacy Technician Crystal Walker to reach out to provider to request that the prescription for the [facility] be faxed into [employer] (fax # [phone]).  Will ask Crystal to work with patient and provider to complete the full application for patient assistance for Entresto for 2019.  2) Will follow with Crystal as she works with [name].  Duanne Moron, PharmD, [facility] Clinical Pharmacist [facility] [phone]

## 2017-05-20 ENCOUNTER — Other Ambulatory Visit: Payer: Self-pay | Admitting: Pharmacy Technician

## 2017-05-20 NOTE — Patient Outreach (Signed)
[facility]) Care Management  05/20/2017  Kathryn Morgan 09-Mar-1941 [id number]  Outgoing call to Dr. Okey Regal office to request a prescription for [facility]. This patient is currently being followed by Kathryn Morgan, Rph  and she is trying to get her pre-screened for patient assistance through [employer]. In order for this process to take place they have requested for the physician to fax a new prescription to [phone]. I left a detailed voicemail since I was unable to speak with a representative and requested that someone return my call.  [name], CPhT [facility] [phone]

## 2017-05-25 ENCOUNTER — Other Ambulatory Visit: Payer: Self-pay | Admitting: Pharmacy Technician

## 2017-05-26 NOTE — Patient Outreach (Signed)
[facility]) Care Management  05/26/2017  ADELADE OSTERMEIER 05-26-1941 [id number]  Outgoing call to [employer] patient assistance program in reference to getting a pre-screen started for [name]. In order for the process to start they needed the office of Dr. Dwana Melena to fax in a 90 day prescription for Entresto. I spoke with Morrie Sheldon last week and she was going to fax the prescription in. As of today the prescription has not been received but per the representative it can take up to three days before they can tell whether or not it's been received.  In order to ensure there is no further delay I contacted Morrie Sheldon to make sure the fax was sent. She received the prescription from Dr. Margo Aye yesterday and faxed it. I will follow-up with [employer] next week.  [name], CPhT [facility] [phone]

## 2017-06-01 ENCOUNTER — Other Ambulatory Visit: Payer: Self-pay | Admitting: Pharmacy Technician

## 2017-06-01 NOTE — Patient Outreach (Signed)
[facility]) Care Management  06/01/2017  Kathryn Morgan 04/18/1941 [id number]  Successful outreach call to [employer] patient assistance program to follow-up on call from last week. We are trying to get patient pre-screened for patient assistance and [employer] is awaiting the prescription that was faxed for Dr. Scharlene Gloss office on Monday. The representative states they are currently processing faxes that were received on Monday. I will call back on Friday to check on whether of not the fax was received.  [name], CPhT [facility] [phone]

## 2017-06-03 ENCOUNTER — Other Ambulatory Visit: Payer: Self-pay | Admitting: Pharmacy Technician

## 2017-06-03 NOTE — Patient Outreach (Signed)
[facility]) Care Management  06/03/2017  Kathryn Morgan 05/06/1941 [id number]  Successful outreach call to [employer] patient assistance program in reference to prescreening Kathryn Morgan for [employer]. She is enrolled in the program as of 06/02/2017 and the order will be sent to the pharmacy for a 30 day supply. The pharmacy will contact the patient to discuss the details of delivery. They only approved a 30 day supply in order to give the patient and provider time to submit the application and all necessary documentation.  I have contacted the patient to update her on the status of the prescreening. HIPAA identifiers verified and verbal consent received. I verified that she has received the application  [employer] mailed to her home and went over the sections she needs to complete. We also discussed the documentation that she needs to provide. The patient states she will pick up the provider portion of the application from Dr. Margo Aye and she will mail the completed application to [employer].   [name], CPhT [facility] [phone]

## 2017-06-08 ENCOUNTER — Other Ambulatory Visit: Payer: Self-pay | Admitting: Pharmacy Technician

## 2017-06-08 NOTE — Patient Outreach (Signed)
[facility]) Care Management  06/08/2017  Kathryn Morgan 1941-03-21 [id number]   Returning patient call to [name] to assist with [employer] patient assistance application. HIPAA identifiers verified and verbal consent received. Patient was prescreened and approved for a 30 day supply of Entresto until completed application and financial documentation is sent to [employer]. Patient contacted me to assist her with the completion of the application over the phone. The patient will follow-up with Dr. Margo Aye to get the provider portion of the application once completed and she will either have the office to fax in the application or she will mail it. Once the application is submitted [name] will contact me and I will follow-up with [employer] to check the status.  [name], CPhT [facility] [phone]

## 2017-06-13 ENCOUNTER — Ambulatory Visit: Payer: Self-pay | Admitting: Pharmacy Technician

## 2017-06-16 ENCOUNTER — Other Ambulatory Visit: Payer: Self-pay | Admitting: Pharmacy Technician

## 2017-06-16 NOTE — Patient Outreach (Signed)
[facility]) Care Management  06/16/2017  [name] 09-02-1940 [id number]   Outreach call to [employer] patient assistance program in reference to an application for [employer]. Patient was prescreened for the program and was approved for a one month supply of medication while her application is completed. I have been working with Morrie Sheldon at Dr. Scharlene Gloss office and the patient's husband dropped the application off for Dr. Margo Aye to complete. Per our last conversation Morrie Sheldon was going to fax it to [employer] once it was completed. As of today the application has not been received by [employer]. I sent a secured email to [place] to check on the status of the application. I am also following up with the patient to inform her that I am still in the process of getting her application sent to [employer].  [name], CPhT [facility] [phone]

## 2017-06-21 ENCOUNTER — Other Ambulatory Visit: Payer: Self-pay | Admitting: Pharmacy Technician

## 2017-06-21 NOTE — Patient Outreach (Signed)
[facility]) Care Management  06/21/2017  [name] Jul 03, 1940 [id number]  Follow-up call to [employer] patient assistance program in reference to an application submitted for Entresto. The application has been received and is in the final approval stage. The representative is not sure how long the process will take and recommends for me to call back next week to check on the status.  I contacted Ms. Deitering and informed her of the new information that I received. HIPAA identifiers verified and verbal consent received. The patient informed me that Dr. Margo Aye increased her dose and she is taking 4 tablets daily of the medication that she has. Dr. Margo Aye is aware and she has a follow-up appointment next month. We discussed that if she is approved for patient assistance that Dr. Margo Aye will have to send in a new prescription for the correct dosage.  PLAN:  I will follow-up with [employer] next week to see if the application has been approved.  [name], CPhT [facility] [phone]

## 2017-06-28 ENCOUNTER — Other Ambulatory Visit: Payer: Self-pay | Admitting: Pharmacy Technician

## 2017-06-28 NOTE — Patient Outreach (Signed)
[facility]) Care Management  06/28/2017  Kathryn Morgan 26-May-1941 [id number]  Successful outreach call to [employer] patient assistance program in reference to application submitted for Entresto. All of the necessary documentation has been received and the application is being processed. The representative states that due to open enrollment the process is taking longer than usual. They will mail a letter to the patient once the application is processed letting her know whether or not it has been approved.  I contacted Mrs. Waterhouse to update her of the current status of the application. HIPAA identifiers verified and verbal consent received. I will call [employer] again on Monday to see if the status has been updated.  [name], CPhT [facility] [phone]

## 2017-07-04 ENCOUNTER — Other Ambulatory Visit: Payer: Self-pay | Admitting: Pharmacy Technician

## 2017-07-04 NOTE — Patient Outreach (Signed)
[facility]) Care Management  07/04/2017  Kathryn Morgan 1941-04-07 [id number]   Follow up call to [employer] patient assistance program in reference to application submitted for Entresto. The application is in the final stages of being reviewed and a decision on approval should be made soon.  [name], CPhT [facility] [phone]

## 2017-07-08 ENCOUNTER — Other Ambulatory Visit: Payer: Self-pay | Admitting: Pharmacy Technician

## 2017-07-08 NOTE — Patient Outreach (Signed)
[facility]) Care Management  07/08/2017  INGEBORG SCHRACK Sep 25, 1940 [id number]  Follow-up call to [employer] patient assistance program in reference to application submitted for Entresto. The application was denied due to medication not costing 10% of annual household income. I discussed this with Gentry Fitz, Rph and she referred me to the [facility] since enrollment is now open.  I contacted Mrs. Shankar so that I could assist her with the enrollment process over the phone. HIPAA identifiers verified and verbal consent received. I first addressed the current dosage of her Sherryll Burger since she previously told me that Dr. Margo Aye was changing the dose. The dose was increased and a new prescription was sent to [facility] and it was filled on 06/17/2017.  I did a conference call with the patient and the [employer] and the patient has been approved with dates of 04/09/2017 through 07/07/2018.  While on the phone I requested to be transferred to the claims department to inquire how the patient could be reimbursed for the prescription that she previously had filled. We were given information on where the reimbursement form can be found and what documentation would need to be submitted along with the form.  I also called [facility] with the patient to inquire if they could rebill the prescription that was filled in January. I provided the [employer] billing information and the pharmacist was able to rebill November, December, and January fills for the patient.  She explained to the patient that a check will be mailed to her for November and December and a credit will be applied to the store charge account for January.  Lastly I followed-up with Gentry Fitz, Rph to inform her that the patient was approved. I also requested that she update the Entresto dosage in Epic to the patient's current dose.  [name], CPhT [facility] [phone]

## 2017-07-13 ENCOUNTER — Other Ambulatory Visit: Payer: Self-pay | Admitting: Pharmacist

## 2017-07-13 NOTE — Patient Outreach (Signed)
Receive notification from [facility] Pharmacy Technician Crystal Dan Humphreys letting me know that patient was approved for assistance for Entresto through the heart failure program from the [facility] for dates of 04/09/2017 through 07/07/2018. Per Crystal, the patient's Entresto dose has also been changed. Call to confirm dose with patient's local pharmacy, [facility]. Confirm that patient is now taking Entresto 97-103 mg twice daily. Update patient's Epic medication list accordingly.  Will send patient closure letter and close pharmacy episode at this time.  Duanne Moron, PharmD, [facility] Clinical Pharmacist [facility] [phone]

## 2017-09-26 ENCOUNTER — Other Ambulatory Visit: Payer: Self-pay | Admitting: Adult Health

## 2017-11-23 ENCOUNTER — Telehealth: Payer: Self-pay | Admitting: Cardiology

## 2017-11-23 NOTE — Telephone Encounter (Signed)
Patient is in HF care w/ [employer]. Just wanted to let MD know patient is taking Meloxicam. / tg

## 2017-11-23 NOTE — Telephone Encounter (Signed)
Thank you for the FYI. Ideally she would not be on this with her history of heart failure but I do not know the underlying circumstances for why it was prescribed. Thankfully, she is not on anticoagulation by review of notes. Would recommend follow-up and can further address at that time.    Signed, Ellsworth Lennox, PA-C 11/23/2017, 4:51 PM

## 2018-01-18 NOTE — Progress Notes (Signed)
Cardiology Office Note  Date: 01/19/2018   ID: Kathryn Morgan, DOB 1940/10/21, MRN [id number]  PCP: Benita Stabile, MD  Primary Cardiologist: Nona Dell, MD   Chief Complaint  Patient presents with  . Status post AVR    History of Present Illness: Kathryn Morgan is a 77 y.o. female last seen by [name] DNP in August 2018.  She is here today for a routine follow-up visit.  She does not report any major health changes, has had some gout flares.  She just saw Dr. Margo Aye for a routine visit earlier today and I reviewed her recent lab work.  She does not report any angina symptoms, no palpitations, currently NYHA class II dyspnea with typical activities.  We went over her cardiac medications which are outlined below.  She reports no intolerances.  Generally, she uses Lasix 40 mg a day but takes an extra tablet when she has leg swelling.  Last echocardiogram was in August 2018, we discussed obtaining a follow-up study.  Past Medical History:  Diagnosis Date  . Aortic stenosis    Status post 23mm [facility] Ease pericardial tissue valve  . Chronic kidney disease   . Chronic systolic heart failure (HCC)    LVEF 35%  . COPD (chronic obstructive pulmonary disease) (HCC)   . Depression   . Dyslipidemia   . Essential hypertension, benign   . Gallstone pancreatitis    2005  . GERD (gastroesophageal reflux disease)   . Necrotizing pancreatitis   . Neuropathy   . Osteoporosis   . Rhabdomyolysis   . Rheumatic fever   . Right bundle-branch block   . Type 2 diabetes mellitus (HCC)    Insulin pump    Past Surgical History:  Procedure Laterality Date  . AORTIC VALVE REPLACEMENT  08/27/2011   Procedure: AORTIC VALVE REPLACEMENT (AVR);  Surgeon: Purcell Nails, MD;  Location: [facility] OR;  Service: Open Heart Surgery;  Laterality: N/A;  . CHOLECYSTECTOMY    . Debridement pancreas    . ESOPHAGOGASTRODUODENOSCOPY  04/21/04   Normal esophagus/normal D1,D2; attempted  transgastric  drainage/stent placement of pancreatic pseudocyst, no obvious location for gastrotomy, therefore transferred to [facility]  . LEFT AND RIGHT HEART CATHETERIZATION WITH CORONARY ANGIOGRAM N/A 06/03/2011   Procedure: LEFT AND RIGHT HEART CATHETERIZATION WITH CORONARY ANGIOGRAM;  Surgeon: Kathleene Hazel, MD;  Location: [facility] CATH LAB;  Service: Cardiovascular;  Laterality: N/A;  Right Heart Cath also  . PANCREATIC PSEUDOCYST DRAINAGE      Current Outpatient Medications  Medication Sig Dispense Refill  . albuterol (PROVENTIL HFA;VENTOLIN HFA) 108 (90 BASE) MCG/ACT inhaler Inhale 2 puffs into the lungs every 6 (six) hours as needed for wheezing or shortness of breath.    Marland Kitchen aspirin EC 81 MG tablet Take 81 mg by mouth daily.    . carvedilol (COREG) 12.5 MG tablet TAKE 1 TABLET BY MOUTH TWO  TIMES DAILY WITH A MEAL 180 tablet 3  . Clobetasol Prop Crea-Coal Tar (CLOBETA CREAM EX) Apply topically.    . docusate sodium (COLACE) 100 MG capsule Take 100 mg by mouth 2 (two) times daily.      . fish oil-omega-3 fatty acids 1000 MG capsule Take 2 g by mouth 2 (two) times daily.     . furosemide (LASIX) 40 MG tablet Take 1 tablet (40 mg total) by mouth 2 (two) times daily. 180 tablet 3  . furosemide (LASIX) 40 MG tablet Take 40 mg by mouth daily.    Marland Kitchen  Glycopyrrolate-Formoterol (BEVESPI AEROSPHERE) 9-4.8 MCG/ACT AERO     . Insulin Lispro, Human, (HUMALOG KWIKPEN [place]) Inject 10-15 Units into the skin 3 (three) times daily with meals. Per sliding scale    . Magnesium 400 MG CAPS Take 1 capsule by mouth daily.    . metFORMIN (GLUCOPHAGE) 1000 MG tablet Take 1,000 mg by mouth 2 (two) times daily with a meal.     . sacubitril-valsartan (ENTRESTO) 97-103 MG Take 1 tablet by mouth 2 (two) times daily.    . simvastatin (ZOCOR) 20 MG tablet Take 20 mg by mouth daily.    Nathen May SOLOSTAR 300 UNIT/ML SOPN     . spironolactone (ALDACTONE) 25 MG tablet Take 1 tablet (25 mg total) by mouth daily. 30 tablet 0   No current  facility-administered medications for this visit.    Allergies:  Codeine; Promethazine hcl; Ace inhibitors; Adhesive [tape]; and Levofloxacin   Social History: The patient  reports that she quit smoking about 36 years ago. Her smoking use included cigarettes. She has a 45.00 pack-year smoking history. She has never used smokeless tobacco. She reports that she does not drink alcohol or use drugs.   ROS:  Please see the history of present illness. Otherwise, complete review of systems is positive for gout flares.  All other systems are reviewed and negative.   Physical Exam: VS:  BP (!) 142/68   Pulse 84   Ht 5\' 3"  (1.6 m)   Wt 214 lb (97.1 kg)   SpO2 92%   BMI 37.91 kg/m , BMI Body mass index is 37.91 kg/m.  Wt Readings from Last 3 Encounters:  01/19/18 214 lb (97.1 kg)  01/14/17 204 lb (92.5 kg)  11/29/16 204 lb (92.5 kg)    General: Patient appears comfortable at rest. HEENT: Conjunctiva and lids normal, oropharynx clear. Neck: Supple, no elevated JVP or carotid bruits, no thyromegaly. Lungs: Clear to auscultation, nonlabored breathing at rest. Cardiac: Regular rate and rhythm, no S3, 2/6 systolic murmur. Abdomen: Soft, nontender, bowel sounds present. Extremities: Mild ankle edema, distal pulses 2+. Skin: Warm and dry. Musculoskeletal: No kyphosis. Neuropsychiatric: Alert and oriented x3, affect grossly appropriate.  ECG: I personally reviewed the tracing from 10/05/2016 which showed sinus rhythm with right bundle branch block and left anterior fascicular block.  Recent Labwork:  August 2019: Hemoglobin 12.1, platelets 219, BUN 20, creatinine 0.85, potassium 5.2, AST 16, ALT 13, cholesterol 146, triglycerides 118, HDL 63, LDL 59, hemoglobin A1c 7.7  Other Studies Reviewed Today:  Echocardiogram 01/10/2017: Study Conclusions  - Left ventricle: Inferior wall hypokinesis. The cavity size was   normal. Wall thickness was normal. Systolic function was mildly   to moderately  reduced. The estimated ejection fraction was in the   range of 40% to 45%. Doppler parameters are consistent with   abnormal left ventricular relaxation (grade 1 diastolic   dysfunction). - Aortic valve: Bioprosthetic tissue valve with no peri valvular   regurgitation and gradients similar to May of this year. Valve   area (VTI): 0.87 cm^2. Valve area (Vmax): 0.87 cm^2. Valve area   (Vmean): 0.77 cm^2. - Mitral valve: Calcified annulus. - Atrial septum: No defect or patent foramen ovale was identified.  Assessment and Plan:  1.  Aortic stenosis status post bioprosthetic AVR in 2013.  Continue with medical therapy, reduce to reduce aspirin 81 mg daily.  We will obtain a follow-up echocardiogram as well.  2.  Cardiomyopathy with LVEF 40 to 45% range by echocardiogram last year.  Continue medical therapy including Coreg, Entresto, and Aldactone.  3.  Essential hypertension, keep follow-up with Dr. Margo Aye, no changes made to present antihypertensive medications.  4.  Mixed hyperlipidemia, continues on Zocor with recent LDL 59.  Current medicines were reviewed with the patient today.   Orders Placed This Encounter  Procedures  . ECHOCARDIOGRAM COMPLETE    Disposition: Follow-up in 1 year.  Signed, Jonelle Sidle, MD, [facility] 01/19/2018 4:37 PM     [facility] at [facility] [address], [place], [place] [address] Phone: [phone]; Fax: [phone]

## 2018-01-19 ENCOUNTER — Ambulatory Visit: Payer: Medicare Other | Admitting: Cardiology

## 2018-01-19 ENCOUNTER — Encounter: Payer: Self-pay | Admitting: Cardiology

## 2018-01-19 VITALS — BP 142/68 | HR 84 | Ht 63.0 in | Wt 214.0 lb

## 2018-01-19 DIAGNOSIS — I10 Essential (primary) hypertension: Secondary | ICD-10-CM

## 2018-01-19 DIAGNOSIS — E782 Mixed hyperlipidemia: Secondary | ICD-10-CM

## 2018-01-19 DIAGNOSIS — I428 Other cardiomyopathies: Secondary | ICD-10-CM | POA: Diagnosis not present

## 2018-01-19 DIAGNOSIS — Z953 Presence of xenogenic heart valve: Secondary | ICD-10-CM | POA: Diagnosis not present

## 2018-01-19 NOTE — Patient Instructions (Addendum)
Your physician wants you to follow-up in: 1 year with Dr.McDowell You will receive a reminder letter in the mail two months in advance. If you don't receive a letter, please call our office to schedule the follow-up appointment.    DECREASE Aspirin to 81 mg daily   All other meds stay the same.    Your physician has requested that you have an echocardiogram. Echocardiography is a painless test that uses sound waves to create images of your heart. It provides your doctor with information about the size and shape of your heart and how well your heart's chambers and valves are working. This procedure takes approximately one hour. There are no restrictions for this procedure.    If you need a refill on your cardiac medications before your next appointment, please call your pharmacy.      No labs today.       Thank you for choosing [facility] !

## 2018-01-25 ENCOUNTER — Ambulatory Visit (HOSPITAL_COMMUNITY)
Admission: RE | Admit: 2018-01-25 | Discharge: 2018-01-25 | Disposition: A | Discharge status: Home / Self Care | Payer: Medicare Other | Source: Ambulatory Visit | Attending: Cardiology | Admitting: Cardiology

## 2018-01-25 DIAGNOSIS — I119 Hypertensive heart disease without heart failure: Secondary | ICD-10-CM | POA: Diagnosis not present

## 2018-01-25 DIAGNOSIS — I4510 Unspecified right bundle-branch block: Secondary | ICD-10-CM | POA: Insufficient documentation

## 2018-01-25 DIAGNOSIS — E785 Hyperlipidemia, unspecified: Secondary | ICD-10-CM | POA: Diagnosis not present

## 2018-01-25 DIAGNOSIS — I429 Cardiomyopathy, unspecified: Secondary | ICD-10-CM | POA: Diagnosis not present

## 2018-01-25 DIAGNOSIS — J449 Chronic obstructive pulmonary disease, unspecified: Secondary | ICD-10-CM | POA: Insufficient documentation

## 2018-01-25 DIAGNOSIS — Z953 Presence of xenogenic heart valve: Secondary | ICD-10-CM | POA: Diagnosis present

## 2018-01-25 DIAGNOSIS — Z6837 Body mass index (BMI) 37.0-37.9, adult: Secondary | ICD-10-CM | POA: Diagnosis not present

## 2018-01-25 DIAGNOSIS — E119 Type 2 diabetes mellitus without complications: Secondary | ICD-10-CM | POA: Diagnosis not present

## 2018-01-25 DIAGNOSIS — I059 Rheumatic mitral valve disease, unspecified: Secondary | ICD-10-CM | POA: Diagnosis not present

## 2018-01-25 NOTE — Progress Notes (Signed)
*  PRELIMINARY RESULTS* Echocardiogram 2D Echocardiogram has been performed.  Stacey Drain 01/25/2018, 1:47 PM

## 2018-08-07 ENCOUNTER — Other Ambulatory Visit: Payer: Self-pay | Admitting: Pharmacist

## 2018-08-07 NOTE — Patient Outreach (Signed)
[facility]) Care Management  08/07/2018  Kathryn Morgan May 06, 1941 [id number]   Receive a voicemail from patient requesting a call back for help with obtaining assistance with paying for her Entresto. Note that we worked with patient previously in 07/2017 to assist her with applying for assistance from the [employer].  11:20 am:   Called and spoke with patient. HIPAA identifiers verified.   [name] states that she has an appointment scheduled with her PCP for next week. Reports that her blood pressure has been higher than usual recently, up to 160/88. Reports that one day she took an extra 1/2 tablet of her furosemide to bring the blood pressure down. Counsel patient about the importance of letting her doctor know about her blood pressure and about discussing this with him before making changes to her medications on her own. Patient verbalizes understanding and states that she will follow up with his office directly today.  [name] asks for assistance with again getting approved for the [employer] Heart Failure assistance program. Note that per the [facility] website, this program status is currently open. Call the assistance program with the patient on the line to get this application started. Speak with [employer] representative, Tasia Catchings, who states that he will follow up with the patient further to get this application completed.   12 pm:   Call to follow up with [name]. Patient reports that she completed the [facility] Foundation application over the phone with Tasia Catchings and was approved for the program again.  States that she also called her PCP office and left a message to let Dr. Margo Aye know about her blood pressure. States that she will await a call back from the office.  Patient denies any further medication questions/concerns at this time.    PLAN  Will close pharmacy episode.  Duanne Moron, PharmD, [facility] Clinical Pharmacist [facility] Care Management [phone]

## 2018-10-03 ENCOUNTER — Other Ambulatory Visit: Payer: Self-pay | Admitting: Cardiology

## 2019-04-06 ENCOUNTER — Telehealth: Payer: Self-pay | Admitting: Cardiology

## 2019-04-06 NOTE — Telephone Encounter (Signed)
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   Ovid Curd the clinical pharmacists would like to know if there is anything Dr. Domenic Polite would like to do about this per phone call from [facility] at Dr. Durene Cal office   Please call Felecia @ [phone]

## 2019-04-08 NOTE — Telephone Encounter (Signed)
I haven't seen her in over a year. Wouldn't necessarily expect Entrasto to be causing swelling though. Please schedule an office visit with me or APP so we can review further.

## 2019-04-09 NOTE — Telephone Encounter (Signed)
Pt has apt next week with Dr.McDowell to discuss

## 2019-04-20 ENCOUNTER — Encounter: Payer: Self-pay | Admitting: Cardiology

## 2019-04-20 ENCOUNTER — Other Ambulatory Visit: Payer: Self-pay

## 2019-04-20 ENCOUNTER — Ambulatory Visit: Payer: Medicare Other | Admitting: Family Medicine

## 2019-04-20 VITALS — BP 159/76 | HR 87 | Temp 97.8°F | Ht 65.0 in | Wt 211.0 lb

## 2019-04-20 DIAGNOSIS — I429 Cardiomyopathy, unspecified: Secondary | ICD-10-CM | POA: Diagnosis not present

## 2019-04-20 DIAGNOSIS — I350 Nonrheumatic aortic (valve) stenosis: Secondary | ICD-10-CM

## 2019-04-20 DIAGNOSIS — E782 Mixed hyperlipidemia: Secondary | ICD-10-CM

## 2019-04-20 DIAGNOSIS — I10 Essential (primary) hypertension: Secondary | ICD-10-CM | POA: Diagnosis not present

## 2019-04-20 MED ORDER — AMLODIPINE BESYLATE 2.5 MG PO TABS: 2.5000 mg | ORAL_TABLET | Freq: Every day | ORAL | 1 refills | Status: DC

## 2019-04-20 NOTE — Progress Notes (Signed)
Cardiology Office Note  Date: 04/20/2019   ID: Kathryn Morgan, DOB 1941/03/03, MRN [id number]  PCP:  Benita Stabile, MD  Cardiologist:  Nona Dell, MD Electrophysiologist:  None   Chief Complaint  Patient presents with  . Follow-up    History of chronic systolic heart failure, cardiomyopathy, aortic stenosis, hypertension    History of Present Illness: Kathryn Morgan is a 78 y.o. female here for 1 year follow-up for history of chronic systolic heart failure, cardiomyopathy, aortic stenosis, hypertension.  Patient states she has been dealing with some gout issues recently in her left foot.  Was treated with colchicine.  States she has been told she cannot take allopurinol for maintenance.  Complains of some lower extremity edema which she attributes to medications.  Her primary care provider decreased her carvedilol recently secondary to complaints of edema.  Patient complains of chronic dyspnea on exertion most likely attributable to COPD and history of smoking.  Recent echocardiogram showed resolution of cardiomyopathy.  Recent EF was 55 to 60%.  She is not very active on a daily basis.  Can perform most of her ADLs at home.  States she is taking extra Lasix as needed for increased weight gain.  States blood pressures systolic ranging in the 150s to 160s at home.  Past Medical History:  Diagnosis Date  . Aortic stenosis    Status post 60mm [facility] Ease pericardial tissue valve  . Chronic kidney disease   . Chronic systolic heart failure (HCC)    LVEF 35%  . COPD (chronic obstructive pulmonary disease) (HCC)   . Depression   . Dyslipidemia   . Essential hypertension, benign   . Gallstone pancreatitis    2005  . GERD (gastroesophageal reflux disease)   . Necrotizing pancreatitis   . Neuropathy   . Osteoporosis   . Rhabdomyolysis   . Rheumatic fever   . Right bundle-branch block   . Type 2 diabetes mellitus (HCC)    Insulin pump    Past Surgical  History:  Procedure Laterality Date  . AORTIC VALVE REPLACEMENT  08/27/2011   Procedure: AORTIC VALVE REPLACEMENT (AVR);  Surgeon: Purcell Nails, MD;  Location: [facility] OR;  Service: Open Heart Surgery;  Laterality: N/A;  . CHOLECYSTECTOMY    . Debridement pancreas    . ESOPHAGOGASTRODUODENOSCOPY  04/21/04   Normal esophagus/normal D1,D2; attempted  transgastric drainage/stent placement of pancreatic pseudocyst, no obvious location for gastrotomy, therefore transferred to [facility]  . LEFT AND RIGHT HEART CATHETERIZATION WITH CORONARY ANGIOGRAM N/A 06/03/2011   Procedure: LEFT AND RIGHT HEART CATHETERIZATION WITH CORONARY ANGIOGRAM;  Surgeon: Kathleene Hazel, MD;  Location: [facility] CATH LAB;  Service: Cardiovascular;  Laterality: N/A;  Right Heart Cath also  . PANCREATIC PSEUDOCYST DRAINAGE      Current Outpatient Medications  Medication Sig Dispense Refill  . albuterol (PROVENTIL HFA;VENTOLIN HFA) 108 (90 BASE) MCG/ACT inhaler Inhale 2 puffs into the lungs every 6 (six) hours as needed for wheezing or shortness of breath.    Marland Kitchen aspirin EC 81 MG tablet Take 81 mg by mouth daily.    . carvedilol (COREG) 12.5 MG tablet TAKE 1 TABLET BY MOUTH TWO  TIMES DAILY WITH A MEAL 180 tablet 3  . Clobetasol Prop Crea-Coal Tar (CLOBETA CREAM EX) Apply topically.    . colchicine 0.6 MG tablet Take 0.6 mg by mouth 2 (two) times daily. Prn    . docusate sodium (COLACE) 100 MG capsule Take 100 mg by mouth daily.     Marland Kitchen  fish oil-omega-3 fatty acids 1000 MG capsule Take 2,500 mg by mouth 2 (two) times daily.     . furosemide (LASIX) 20 MG tablet Take 20 mg by mouth daily.    . Glycopyrrolate-Formoterol (BEVESPI AEROSPHERE) 9-4.8 MCG/ACT AERO     . Insulin Lispro, Human, (HUMALOG KWIKPEN [place]) Inject 10-15 Units into the skin 3 (three) times daily with meals. Per sliding scale    . Magnesium 400 MG CAPS Take 1 capsule by mouth daily.    . metFORMIN (GLUCOPHAGE) 1000 MG tablet Take 1,000 mg by mouth 2 (two) times daily  with a meal.     . sacubitril-valsartan (ENTRESTO) 97-103 MG Take 1 tablet by mouth 2 (two) times daily.    . simvastatin (ZOCOR) 20 MG tablet Take 20 mg by mouth daily.    Marland Kitchen spironolactone (ALDACTONE) 25 MG tablet Take 1 tablet (25 mg total) by mouth daily. 30 tablet 0  . TOU[place]O SOLOSTAR 300 UNIT/ML SOPN     . vitamin C (ASCORBIC ACID) 500 MG tablet Take 500 mg by mouth daily.    Marland Kitchen amLODipine (NORVASC) 2.5 MG tablet Take 1 tablet (2.5 mg total) by mouth daily. 30 tablet 1   No current facility-administered medications for this visit.    Allergies:  Codeine, Promethazine hcl, Ace inhibitors, Adhesive [tape], and Levofloxacin   Social History: The patient  reports that she quit smoking about 37 years ago. Her smoking use included cigarettes. She has a 45.00 pack-year smoking history. She has never used smokeless tobacco. She reports that she does not drink alcohol or use drugs.   Family History: The patient's family history includes Heart attack in her father; Heart disease in an other family member.   ROS:  Please see the history of present illness. Otherwise, complete review of systems is positive for none.  All other systems are reviewed and negative.   Physical Exam: VS:  BP (!) 159/76   Pulse 87   Temp 97.8 F (36.6 C)   Ht 5\' 5"  (1.651 m)   Wt 211 lb (95.7 kg)   SpO2 90%   BMI 35.11 kg/m , BMI Body mass index is 35.11 kg/m.  Wt Readings from Last 3 Encounters:  04/20/19 211 lb (95.7 kg)  01/19/18 214 lb (97.1 kg)  01/14/17 204 lb (92.5 kg)    General: Patient appears comfortable at rest. HEENT: Conjunctiva and lids normal, oropharynx clear with moist mucosa. Neck: Supple, no elevated JVP or carotid bruits, no thyromegaly. Lungs: Clear to auscultation, nonlabored breathing at rest. Cardiac: Regular rate and rhythm,  Mild systolic murmur heard at right upper sternal border, no pericardial rub. Abdomen: Soft, nontender, no hepatomegaly, bowel sounds present, no guarding or  rebound. Extremities: Trace ankle edema, distal pulses 2+. Skin: Warm and dry. Musculoskeletal: No kyphosis. Neuropsychiatric: Alert and oriented x3, affect grossly appropriate.  ECG:  No ECG reviewed.  Recent Labwork: Lab work in August 2019 showed a hemoglobin of 12.1, hematocrit of 38.6, creatinine of 0.85, GFR of 67, sodium of 143, potassium 5.2.  Total cholesterol 146, triglycerides 118, HDL 63, LDL 59.  Hemoglobin A1c 7.7  Other Studies Reviewed Today: Echocardiogram 01/25/2018  - Left ventricle: The cavity size was normal. Wall thickness was increased in a pattern of moderate LVH. Systolic function was normal. The estimated ejection fraction was in the range of 55% to 60%. Doppler parameters are consistent with abnormal left ventricular relaxation (grade 1 diastolic dysfunction). Doppler parameters are consistent with high ventricular filling pressure. - Aortic  valve: 23 mm [facility] Ease pericardial tissue valve in the AV position. Mean gradient (S): 12 mm Hg. Peak gradient (S): 21 mm Hg. - Mitral valve: Mildly to moderately calcified annulus. Mildly thickened leaflets . - Left atrium: The atrium was mildly dilated. - Atrial septum: No defect or patent foramen ovale was identified. - Technically adequate study.  Assessment and Plan:  1.  Aortic stenosis status post bioprosthetic AVR in 2013.  Auscultated a mild systolic murmur heard at right upper sternal border.  Patient is asymptomatic.  Will monitor.   2.  Cardiomyopathy; left ventricular ejection fraction improved on last echo in August 2019 to 55 to 60% with grade 1 diastolic dysfunction and continued LVH.  Patient denies any weight gain or significant edema.  Remains on Entresto 97/103 mg twice daily, carvedilol 12.5 mg p.o. twice daily.  Lasix 20 mg daily and spironolactone 25 mg daily.  3  Hypertension: Blood pressures have been elevated at home around 150s to 160s systolic.  Add amlodipine 2.5 mg daily.  4.   Mixed hyperlipidemia, continues on Zocor . Recent lipids at PCP office were within normal limits.  Will obtain recent lab work from Dr. Scharlene Gloss office.  Current medicines were reviewed with the patient today.  Medication Adjustments/Labs and Tests Ordered: Current medicines are reviewed at length with the patient today.  Concerns regarding medicines are outlined above.   Tests Ordered: No orders of the defined types were placed in this encounter.   Medication Changes: Meds ordered this encounter  Medications  . amLODipine (NORVASC) 2.5 MG tablet    Sig: Take 1 tablet (2.5 mg total) by mouth daily.    Dispense:  30 tablet    Refill:  1    Disposition:  Follow up in 1 year(s)  Signed, Rennis Harding, NP 04/20/2019 12:22 PM    [facility] at  [facility] [address] [place], [place], [place] [address] Phone: [phone]; Fax: [phone]

## 2019-04-20 NOTE — Patient Instructions (Signed)
Medication Instructions:  START Amlodipine 2.5 mg daily. Call us back  With update. *If you need a refill on your cardiac medications before your next appointment, please call your pharmacy*  Lab Work: None today If you have labs (blood work) drawn today and your tests are completely normal, you will receive your results only by: Marland Kitchen MyChart Message (if you have MyChart) OR . A paper copy in the mail If you have any lab test that is abnormal or we need to change your treatment, we will call you to review the results.  Testing/Procedures: None today  Follow-Up: At [facility], you and your health needs are our priority.  As part of our continuing mission to provide you with exceptional heart care, we have created designated Provider Care Teams.  These Care Teams include your primary Cardiologist (physician) and Advanced Practice Providers (APPs -  Physician Assistants and Nurse Practitioners) who all work together to provide you with the care you need, when you need it.  Your next appointment:   12 months  The format for your next appointment:   In Person  Provider:   Rozann Lesches, MD  Other Instructions None     Thank you for choosing [place] !

## 2019-06-06 ENCOUNTER — Telehealth: Payer: Self-pay

## 2019-06-06 DIAGNOSIS — I10 Essential (primary) hypertension: Secondary | ICD-10-CM

## 2019-06-06 MED ORDER — AMLODIPINE BESYLATE 2.5 MG PO TABS: 2.5000 mg | ORAL_TABLET | Freq: Every day | ORAL | 1 refills | Status: DC

## 2019-06-06 NOTE — Telephone Encounter (Signed)
Pt wanted to inform Katina Dung, NP that the amlodipine she was started on is helping her blood pressures (164/88, 154/83, 125/68, 152/83) She is not having any side effects.

## 2019-06-07 NOTE — Telephone Encounter (Signed)
Thanks Lattie Haw. Glad her BP is better.

## 2019-09-06 ENCOUNTER — Other Ambulatory Visit: Payer: Self-pay | Admitting: Cardiology

## 2020-02-08 ENCOUNTER — Telehealth: Payer: Self-pay | Admitting: Cardiology

## 2020-02-08 NOTE — Telephone Encounter (Signed)
NEW MESSAGE    Artelia Laroche from Dr Catalina Pizza office called this message in  - he wants someone to give Kathryn Morgan a call back he states that her bp is out of control , it has been running 140-150 over 80 .  Last time patient was in our office was 04/2019   Please call thank you

## 2020-02-08 NOTE — Telephone Encounter (Signed)
Patient is going to keep daily BP log and bring with her on her apt on 02/15/20 at 3 pm with Rennis Harding, NP

## 2020-02-15 ENCOUNTER — Ambulatory Visit: Payer: Medicare Other | Admitting: Family Medicine

## 2020-02-15 ENCOUNTER — Other Ambulatory Visit: Payer: Self-pay

## 2020-02-15 ENCOUNTER — Encounter: Payer: Self-pay | Admitting: Family Medicine

## 2020-02-15 VITALS — BP 152/82 | HR 80 | Ht 63.0 in | Wt 195.4 lb

## 2020-02-15 DIAGNOSIS — I429 Cardiomyopathy, unspecified: Secondary | ICD-10-CM | POA: Diagnosis not present

## 2020-02-15 DIAGNOSIS — I10 Essential (primary) hypertension: Secondary | ICD-10-CM | POA: Diagnosis not present

## 2020-02-15 DIAGNOSIS — I350 Nonrheumatic aortic (valve) stenosis: Secondary | ICD-10-CM | POA: Diagnosis not present

## 2020-02-15 DIAGNOSIS — E782 Mixed hyperlipidemia: Secondary | ICD-10-CM

## 2020-02-15 MED ORDER — HYDRALAZINE HCL 25 MG PO TABS: 25.0000 mg | ORAL_TABLET | Freq: Two times a day (BID) | ORAL | 6 refills | Status: DC

## 2020-02-15 NOTE — Patient Instructions (Signed)
Medication Instructions:   Begin Hydralazine 25mg  twice a day.  Continue all other medications.    Labwork: none  Testing/Procedures: none  Follow-Up: 1 month   Any Other Special Instructions Will Be Listed Below (If Applicable). Nurse visit in 2 weeks for blood pressure check - will then decide if need to go up any further on the Hydralazine.    If you need a refill on your cardiac medications before your next appointment, please call your pharmacy.

## 2020-02-15 NOTE — Progress Notes (Signed)
Cardiology Office Note  Date: 02/15/2020   ID: CHAR FELTMAN, DOB March 18, 1941, MRN [id number]  PCP:  Benita Stabile, MD  Cardiologist:  Nona Dell, MD Electrophysiologist:  None   Chief Complaint  Patient presents with  . Follow-up    History of Present Illness: Kathryn Morgan is a 79 y.o. female here for 1 year follow-up for history of chronic systolic heart failure, cardiomyopathy, aortic stenosis, hypertension.  Patient recently complaining of increased blood pressures.  At a previous visit she did been started on amlodipine for blood pressure control.  However she had swelling in her lower extremities and PCP stopped the medication.  She brings with her a log of blood pressures which have been elevated in the 140s to 160s systolic.  She states she feels weak and has problems especially in the a.m. feeling sluggish and tired.  States as the day progresses she feels better.  States she has some lower extremity edema.  She had previously been advised to wear compression stockings but her daughter who is with her states she had refused to wear them in the past.  She had previously attributed lower extremity edema to medications.  Previous history of chronic systolic heart failure on Entresto.  Echo 2018-01-25 showed EF of 55 to 60%.  Grade 1 DD.  Prosthetic aortic valve. She denies any anginal symptoms.  She is not very active on a daily basis.  Blood pressure today is 152/82.   Past Medical History:  Diagnosis Date  . Aortic stenosis    Status post 48mm [facility] Ease pericardial tissue valve  . Chronic kidney disease   . Chronic systolic heart failure (HCC)    LVEF 35%  . COPD (chronic obstructive pulmonary disease) (HCC)   . Depression   . Dyslipidemia   . Essential hypertension, benign   . Gallstone pancreatitis    2005  . GERD (gastroesophageal reflux disease)   . Necrotizing pancreatitis   . Neuropathy   . Osteoporosis   . Rhabdomyolysis   . Rheumatic fever     . Right bundle-branch block   . Type 2 diabetes mellitus (HCC)    Insulin pump    Past Surgical History:  Procedure Laterality Date  . AORTIC VALVE REPLACEMENT  08/27/2011   Procedure: AORTIC VALVE REPLACEMENT (AVR);  Surgeon: Purcell Nails, MD;  Location: [facility] OR;  Service: Open Heart Surgery;  Laterality: N/A;  . CHOLECYSTECTOMY    . Debridement pancreas    . ESOPHAGOGASTRODUODENOSCOPY  04/21/04   Normal esophagus/normal D1,D2; attempted  transgastric drainage/stent placement of pancreatic pseudocyst, no obvious location for gastrotomy, therefore transferred to [facility]  . LEFT AND RIGHT HEART CATHETERIZATION WITH CORONARY ANGIOGRAM N/A 06/03/2011   Procedure: LEFT AND RIGHT HEART CATHETERIZATION WITH CORONARY ANGIOGRAM;  Surgeon: Kathleene Hazel, MD;  Location: [facility] CATH LAB;  Service: Cardiovascular;  Laterality: N/A;  Right Heart Cath also  . PANCREATIC PSEUDOCYST DRAINAGE      Current Outpatient Medications  Medication Sig Dispense Refill  . albuterol (PROVENTIL HFA;VENTOLIN HFA) 108 (90 BASE) MCG/ACT inhaler Inhale 2 puffs into the lungs every 6 (six) hours as needed for wheezing or shortness of breath.    Marland Kitchen aspirin EC 81 MG tablet Take 81 mg by mouth daily.    Marland Kitchen b complex vitamins capsule Take 1 capsule by mouth daily.    . Biotin 1000 MCG tablet Take 1,000 mcg by mouth daily.    . carvedilol (COREG) 12.5 MG tablet TAKE 1  TABLET BY MOUTH TWO  TIMES DAILY WITH A MEAL 180 tablet 3  . Cholecalciferol (VITAMIN D3 PO) Take 1 tablet by mouth daily.    . Clobetasol Prop Crea-Coal Tar (CLOBETA CREAM EX) Apply topically.    . docusate sodium (COLACE) 100 MG capsule Take 100 mg by mouth daily.     . fish oil-omega-3 fatty acids 1000 MG capsule Take 2,500 mg by mouth 2 (two) times daily.     . furosemide (LASIX) 20 MG tablet Take 20 mg by mouth daily.    . Glycopyrrolate-Formoterol (BEVESPI AEROSPHERE) 9-4.8 MCG/ACT AERO Inhale 2 puffs into the lungs 2 (two) times daily.     .  Insulin Lispro, Human, (HUMALOG KWIKPEN [place]) Inject 10-15 Units into the skin 3 (three) times daily with meals. Per sliding scale    . Magnesium 400 MG CAPS Take 1 capsule by mouth daily.    . metFORMIN (GLUCOPHAGE) 1000 MG tablet Take 1,000 mg by mouth 2 (two) times daily with a meal.     . sacubitril-valsartan (ENTRESTO) 97-103 MG Take 1 tablet by mouth 2 (two) times daily.    . simvastatin (ZOCOR) 20 MG tablet Take 20 mg by mouth daily.    Nathen May SOLOSTAR 300 UNIT/ML SOPN Inject 20 Units into the skin 2 (two) times daily.     . vitamin C (ASCORBIC ACID) 500 MG tablet Take 500 mg by mouth daily.    . vitamin E 200 UNIT capsule Take 200 Units by mouth daily.    . Zinc 50 MG TABS Take 1 tablet by mouth daily.    . hydrALAZINE (APRESOLINE) 25 MG tablet Take 1 tablet (25 mg total) by mouth 2 (two) times daily. 60 tablet 6   No current facility-administered medications for this visit.   Allergies:  Amlodipine, Codeine, Promethazine hcl, Ace inhibitors, Adhesive [tape], and Levofloxacin   Social History: The patient  reports that she quit smoking about 38 years ago. Her smoking use included cigarettes. She has a 45.00 pack-year smoking history. She has never used smokeless tobacco. She reports that she does not drink alcohol and does not use drugs.   Family History: The patient's family history includes Heart attack in her father; Heart disease in an other family member.   ROS:  Please see the history of present illness. Otherwise, complete review of systems is positive for none.  All other systems are reviewed and negative.   Physical Exam: VS:  BP (!) 152/82   Pulse 80   Ht 5\' 3"  (1.6 m)   Wt 195 lb 6.4 oz (88.6 kg)   SpO2 91%   BMI 34.61 kg/m , BMI Body mass index is 34.61 kg/m.  Wt Readings from Last 3 Encounters:  02/15/20 195 lb 6.4 oz (88.6 kg)  04/20/19 211 lb (95.7 kg)  01/19/18 214 lb (97.1 kg)    General: Patient appears comfortable at rest. Neck: Supple, no elevated JVP  or carotid bruits, no thyromegaly. Lungs: Clear to auscultation, nonlabored breathing at rest. Cardiac: Regular rate and rhythm,  Mild systolic murmur heard at right upper sternal border, no pericardial rub. Extremities: Trace ankle edema, distal pulses 2+. Skin: Warm and dry. Musculoskeletal: No kyphosis. Neuropsychiatric: Alert and oriented x3, affect grossly appropriate.  ECG:    Recent Labwork: Lab work in August 2019 showed a hemoglobin of 12.1, hematocrit of 38.6, creatinine of 0.85, GFR of 67, sodium of 143, potassium 5.2.  Total cholesterol 146, triglycerides 118, HDL 63, LDL 59.  Hemoglobin A1c  7.7  Other Studies Reviewed Today: Echocardiogram 01/25/2018  - Left ventricle: The cavity size was normal. Wall thickness was increased in a pattern of moderate LVH. Systolic function was normal. The estimated ejection fraction was in the range of 55% to 60%. Doppler parameters are consistent with abnormal left ventricular relaxation (grade 1 diastolic dysfunction). Doppler parameters are consistent with high ventricular filling pressure. - Aortic valve: 23 mm [facility] Ease pericardial tissue valve in the AV position. Mean gradient (S): 12 mm Hg. Peak gradient (S): 21 mm Hg. - Mitral valve: Mildly to moderately calcified annulus. Mildly thickened leaflets . - Left atrium: The atrium was mildly dilated. - Atrial septum: No defect or patent foramen ovale was identified. - Technically adequate study.  Assessment and Plan:  1.  Aortic stenosis status post bioprosthetic AVR in 2013.  mild systolic murmur heard at right upper sternal border.  Status post 23 mm [facility] Ease pericardial tissue valve in the AV position. Mean gradient (S): 12 mm Hg. Peak gradient (S): 21 mm Hg. Patient is asymptomatic.     2.  Cardiomyopathy; left ventricular ejection fraction improved on last echo in August 2019 to 55 to 60% with grade 1 diastolic dysfunction and continued LVH.  Patient denies  any weight gain or significant edema.  Continue Entresto 97/103 mg twice daily, carvedilol 12.5 mg p.o. twice daily.  Lasix 20 mg daily  3  Hypertension: Recent call from Dr. Scharlene Gloss office stating patient's blood pressure was not well controlled.  Continue carvedilol 12.5 mg p.o. twice daily.  Add hydralazine 25 mg p.o. twice daily for now.  Start checking blood pressures daily after taking antihypertensive medications.  Record blood pressures.  Come back in 2 weeks for nursing visit and blood pressure check..  Bring blood pressure logs with you.  We may need to uptitrate the medication in the future.  4.  Mixed hyperlipidemia Continue on Zocor 20 mg daily  Current medicines were reviewed with the patient today.  Medication Adjustments/Labs and Tests Ordered: Current medicines are reviewed at length with the patient today.  Concerns regarding medicines are outlined above.   Tests Ordered: No orders of the defined types were placed in this encounter.   Medication Changes: Meds ordered this encounter  Medications  . hydrALAZINE (APRESOLINE) 25 MG tablet    Sig: Take 1 tablet (25 mg total) by mouth 2 (two) times daily.    Dispense:  60 tablet    Refill:  6    New 02/15/2020    Disposition : Follow-up with Dr. Diona Browner or APP in 1 month   Signed, Rennis Harding, NP 02/15/2020 4:18 PM    [facility] at [facility] [address] [place], [place], [place] [address] Phone: [phone]; Fax: [phone]

## 2020-02-27 ENCOUNTER — Telehealth: Payer: Self-pay | Admitting: Family Medicine

## 2020-02-27 NOTE — Telephone Encounter (Signed)
Patient called stating that she has stopped taking a medication that was written by Nena Polio.

## 2020-02-27 NOTE — Telephone Encounter (Signed)
Reports taking hydralazine 25 mg BID x's 5 days and felt weak and oxygen level dropped in the 70's. Reports stopping hydralazine one day last week d/t feeling very weak and low oxygen level. Reports having COPD and used her her rescue inhaler (proair) and neb tx (albuterol) and O2 level is now in 90 %. BP has been 139/71; 146/67; 148/75; 165/84; 139/76; 159/77. Advised that her medication profile will be updated and provider notified. Advised to continue monitoring her blood pressure. Verbalized understanding of plan.

## 2020-02-28 NOTE — Telephone Encounter (Signed)
Thank You.

## 2020-02-29 ENCOUNTER — Ambulatory Visit: Payer: Medicare Other

## 2020-02-29 NOTE — Addendum Note (Signed)
Addended by: Eustace Moore on: 02/29/2020 08:31 AM   Modules accepted: Orders

## 2020-03-16 NOTE — Progress Notes (Addendum)
Cardiology Office Note  Date: 03/17/2020   ID: Kathryn Morgan, DOB 1940-09-22, MRN [id number]  PCP:  Benita Stabile, MD  Cardiologist:  Nona Dell, MD Electrophysiologist:  None   No chief complaint on file.   History of Present Illness: Kathryn Morgan is a 79 y.o. female here for 1 year follow-up for history of chronic systolic heart failure, cardiomyopathy, aortic stenosis, hypertension.  At last visit she complained of increased blood pressures.  At a previous visit she had been started on amlodipine for blood pressure control.  However she had swelling in her lower extremities and PCP stopped the medication.  She brought  a log of blood pressures which have been elevated in the 140s to 180s.  She stated she felt  weak and had problems especially in the a.m. feeling sluggish and tired.  Stated as the day progresses she feels better.  Stated she has some lower extremity edema.  She had previously been advised to wear compression stockings but her daughter who is with her states she had refused to wear them in the past.  She had previously attributed lower extremity edema to medications.  Previous history of chronic systolic heart failure on Entresto.  Echo 2018-01-25 showed EF of 55 to 60%.  Grade 1 DD.  Prosthetic aortic valve. She denied any anginal symptoms.  She is not very active on a daily basis.    She presents today for 1 month follow-up.  We started her on hydralazine at last visit due to increased blood pressures.  She stated she started feeling weak and had issues with her COPD so she stopped the medication.  She states she has been taking all of her other antihypertensive medications correctly and her blood pressure has gotten better.  She also stated she decreased her coffee consumption down to 2 cups/day versus 6/day she was consuming prior.  She continues with some mild to moderate dyspnea on exertion.  Denies any palpitations or arrhythmias, orthostatic symptoms, CVA or  TIA-like symptoms, PND, orthopnea, claudication-like symptoms, DVT or PE-like symptoms, lower extremity edema.  EKG today shows normal sinus rhythm, left axis deviation, right bundle branch block rate of 82.  Blood pressure 138/70.  States she is now taking her antihypertensives much earlier in the morning and blood pressures seem to have improved.  States she has been wearing her compression stockings and this helps with her lower extremity edema.   Past Medical History:  Diagnosis Date  . Aortic stenosis    Status post 10mm [facility] Ease pericardial tissue valve  . Chronic kidney disease   . Chronic systolic heart failure (HCC)    LVEF 35%  . COPD (chronic obstructive pulmonary disease) (HCC)   . Depression   . Dyslipidemia   . Essential hypertension, benign   . Gallstone pancreatitis    2005  . GERD (gastroesophageal reflux disease)   . Necrotizing pancreatitis   . Neuropathy   . Osteoporosis   . Rhabdomyolysis   . Rheumatic fever   . Right bundle-branch block   . Type 2 diabetes mellitus (HCC)    Insulin pump    Past Surgical History:  Procedure Laterality Date  . AORTIC VALVE REPLACEMENT  08/27/2011   Procedure: AORTIC VALVE REPLACEMENT (AVR);  Surgeon: Purcell Nails, MD;  Location: [facility] OR;  Service: Open Heart Surgery;  Laterality: N/A;  . CHOLECYSTECTOMY    . Debridement pancreas    . ESOPHAGOGASTRODUODENOSCOPY  04/21/04   Normal esophagus/normal D1,D2;  attempted  transgastric drainage/stent placement of pancreatic pseudocyst, no obvious location for gastrotomy, therefore transferred to [facility]  . LEFT AND RIGHT HEART CATHETERIZATION WITH CORONARY ANGIOGRAM N/A 06/03/2011   Procedure: LEFT AND RIGHT HEART CATHETERIZATION WITH CORONARY ANGIOGRAM;  Surgeon: Kathleene Hazel, MD;  Location: [facility] CATH LAB;  Service: Cardiovascular;  Laterality: N/A;  Right Heart Cath also  . PANCREATIC PSEUDOCYST DRAINAGE      Current Outpatient Medications  Medication Sig  Dispense Refill  . albuterol (PROVENTIL HFA;VENTOLIN HFA) 108 (90 BASE) MCG/ACT inhaler Inhale 2 puffs into the lungs every 6 (six) hours as needed for wheezing or shortness of breath.    Marland Kitchen albuterol (PROVENTIL) (2.5 MG/3ML) 0.083% nebulizer solution Take 2.5 mg by nebulization as needed for wheezing or shortness of breath.    Marland Kitchen aspirin EC 81 MG tablet Take 81 mg by mouth daily.    Marland Kitchen b complex vitamins capsule Take 1 capsule by mouth daily.    . Biotin 1000 MCG tablet Take 1,000 mcg by mouth daily.    . carvedilol (COREG) 12.5 MG tablet TAKE 1 TABLET BY MOUTH TWO  TIMES DAILY WITH A MEAL 180 tablet 3  . Cholecalciferol (VITAMIN D3 PO) Take 1 tablet by mouth daily.    . Clobetasol Prop Crea-Coal Tar (CLOBETA CREAM EX) Apply topically.    . docusate sodium (COLACE) 100 MG capsule Take 100 mg by mouth daily.     . febuxostat (ULORIC) 40 MG tablet Take 1 tablet by mouth daily.    . fish oil-omega-3 fatty acids 1000 MG capsule Take 2,500 mg by mouth 2 (two) times daily.     . furosemide (LASIX) 20 MG tablet Take 20 mg by mouth daily.    . Glycopyrrolate-Formoterol (BEVESPI AEROSPHERE) 9-4.8 MCG/ACT AERO Inhale 2 puffs into the lungs 2 (two) times daily.     . Insulin Lispro, Human, (HUMALOG KWIKPEN [place]) Inject 10-15 Units into the skin 3 (three) times daily with meals. Per sliding scale    . Magnesium 400 MG CAPS Take 1 capsule by mouth daily.    . metFORMIN (GLUCOPHAGE) 1000 MG tablet Take 1,000 mg by mouth 2 (two) times daily with a meal.     . sacubitril-valsartan (ENTRESTO) 97-103 MG Take 1 tablet by mouth 2 (two) times daily.    . simvastatin (ZOCOR) 20 MG tablet Take 20 mg by mouth daily.    Nathen May SOLOSTAR 300 UNIT/ML SOPN Inject 20 Units into the skin 2 (two) times daily.     . vitamin C (ASCORBIC ACID) 500 MG tablet Take 500 mg by mouth daily.    . Zinc 50 MG TABS Take 1 tablet by mouth daily.     No current facility-administered medications for this visit.   Allergies:  Amlodipine,  Codeine, Promethazine hcl, Ace inhibitors, Adhesive [tape], Hydralazine, and Levofloxacin   Social History: The patient  reports that she quit smoking about 38 years ago. Her smoking use included cigarettes. She has a 45.00 pack-year smoking history. She has never used smokeless tobacco. She reports that she does not drink alcohol and does not use drugs.   Family History: The patient's family history includes Heart attack in her father; Heart disease in an other family member.   ROS:  Please see the history of present illness. Otherwise, complete review of systems is positive for none.  All other systems are reviewed and negative.   Physical Exam: VS:  BP 138/70   Pulse 85   Ht 5\' 4"  (1.626  m)   Wt 194 lb (88 kg)   SpO2 90%   BMI 33.30 kg/m , BMI Body mass index is 33.3 kg/m.  Wt Readings from Last 3 Encounters:  03/17/20 194 lb (88 kg)  02/15/20 195 lb 6.4 oz (88.6 kg)  04/20/19 211 lb (95.7 kg)    General: Patient appears comfortable at rest. Neck: Supple, no elevated JVP or carotid bruits, no thyromegaly. Lungs: Clear to auscultation, nonlabored breathing at rest. Cardiac: Regular rate and rhythm,  Mild systolic murmur heard at right upper sternal border, no pericardial rub. Extremities: Trace ankle edema, distal pulses 2+. Skin: Warm and dry. Musculoskeletal: No kyphosis. Neuropsychiatric: Alert and oriented x3, affect grossly appropriate.  ECG: 03/17/2020 EKG normal sinus rhythm left axis deviation, RBBB rate of 82  Recent Labwork: Lab work in August 2019 showed a hemoglobin of 12.1, hematocrit of 38.6, creatinine of 0.85, GFR of 67, sodium of 143, potassium 5.2.  Total cholesterol 146, triglycerides 118, HDL 63, LDL 59.  Hemoglobin A1c 7.7  Other Studies Reviewed Today:  Echocardiogram 01/25/2018  - Left ventricle: The cavity size was normal. Wall thickness was increased in a pattern of moderate LVH. Systolic function was normal. The estimated ejection fraction was in  the range of 55% to 60%. Doppler parameters are consistent with abnormal left ventricular relaxation (grade 1 diastolic dysfunction). Doppler parameters are consistent with high ventricular filling pressure. - Aortic valve: 23 mm [facility] Ease pericardial tissue valve in the AV position. Mean gradient (S): 12 mm Hg. Peak gradient (S): 21 mm Hg. - Mitral valve: Mildly to moderately calcified annulus. Mildly thickened leaflets . - Left atrium: The atrium was mildly dilated. - Atrial septum: No defect or patent foramen ovale was identified. - Technically adequate study.  Assessment and Plan:  1.  Aortic stenosis status post bioprosthetic AVR in 2013.  Mild systolic murmur heard at right upper sternal border.  Status post 23 mm [facility] Ease pericardial tissue valve in the AV position. Mean gradient (S): 12 mm Hg. Peak gradient (S): 21 mm Hg.   2.  Cardiomyopathy; left ventricular ejection fraction improved on last echo in August 2019 to 55 to 60% with grade 1 diastolic dysfunction and continued LVH.  Patient denies any weight gain or significant edema.  Continue Entresto 97/103 mg twice daily, carvedilol 12.5 mg p.o. twice daily.  Lasix 20 mg daily  3  Hypertension: Recent call from Dr. Scharlene Gloss office stating patient's blood pressure was not well controlled.  Continue carvedilol 12.5 mg p.o. twice daily.  Added hydralazine 25 mg p.o. twice daily at last visit. She called back after taking the medication for 5 days and stated she could not tolerate it.  Blood pressure is improved today from previous visit.  4.  Mixed hyperlipidemia Continue on Zocor 20 mg daily  5.  Dyspnea Patient states she has been experiencing some increased dyspnea.  She is not very active on a daily basis.  She states to some degree this is chronic but it seems to be gradually increasing.  Please get a repeat echocardiogram.  Current medicines were reviewed with the patient today.  Medication Adjustments/Labs  and Tests Ordered: Current medicines are reviewed at length with the patient today.  Concerns regarding medicines are outlined above.   Tests Ordered: No orders of the defined types were placed in this encounter.   Medication Changes: No orders of the defined types were placed in this encounter.   Disposition : Follow-up with Dr. Diona Browner or  APP in 6 months  Signed, Rennis Harding, NP 03/17/2020 10:52 AM    [facility] at [facility] [address] [place], [place], [place] [address] Phone: [phone]; Fax: [phone]

## 2020-03-17 ENCOUNTER — Encounter: Payer: Self-pay | Admitting: Family Medicine

## 2020-03-17 ENCOUNTER — Ambulatory Visit: Payer: Medicare Other | Admitting: Family Medicine

## 2020-03-17 VITALS — BP 138/70 | HR 85 | Ht 64.0 in | Wt 194.0 lb

## 2020-03-17 DIAGNOSIS — I10 Essential (primary) hypertension: Secondary | ICD-10-CM | POA: Diagnosis not present

## 2020-03-17 DIAGNOSIS — R0600 Dyspnea, unspecified: Secondary | ICD-10-CM | POA: Diagnosis not present

## 2020-03-17 DIAGNOSIS — E782 Mixed hyperlipidemia: Secondary | ICD-10-CM

## 2020-03-17 DIAGNOSIS — I429 Cardiomyopathy, unspecified: Secondary | ICD-10-CM | POA: Diagnosis not present

## 2020-03-17 DIAGNOSIS — I350 Nonrheumatic aortic (valve) stenosis: Secondary | ICD-10-CM | POA: Diagnosis not present

## 2020-03-17 NOTE — Addendum Note (Signed)
Addended by: Lesle Chris on: 03/17/2020 11:35 AM   Modules accepted: Orders

## 2020-03-17 NOTE — Patient Instructions (Signed)

## 2020-04-09 ENCOUNTER — Other Ambulatory Visit: Payer: Medicare Other

## 2020-05-07 ENCOUNTER — Other Ambulatory Visit: Payer: Medicare Other

## 2020-06-11 ENCOUNTER — Other Ambulatory Visit: Payer: Medicare Other

## 2020-07-09 ENCOUNTER — Other Ambulatory Visit: Payer: Medicare Other

## 2020-10-23 ENCOUNTER — Other Ambulatory Visit (HOSPITAL_COMMUNITY): Payer: Self-pay | Admitting: Nephrology

## 2020-10-23 ENCOUNTER — Other Ambulatory Visit: Payer: Self-pay | Admitting: Nephrology

## 2020-10-23 DIAGNOSIS — R809 Proteinuria, unspecified: Secondary | ICD-10-CM

## 2020-10-23 DIAGNOSIS — E1129 Type 2 diabetes mellitus with other diabetic kidney complication: Secondary | ICD-10-CM

## 2020-10-23 DIAGNOSIS — E1122 Type 2 diabetes mellitus with diabetic chronic kidney disease: Secondary | ICD-10-CM

## 2020-10-29 ENCOUNTER — Other Ambulatory Visit: Payer: Self-pay

## 2020-10-29 ENCOUNTER — Ambulatory Visit (HOSPITAL_COMMUNITY)
Admission: RE | Admit: 2020-10-29 | Discharge: 2020-10-29 | Disposition: A | Discharge status: Home / Self Care | Payer: Medicare Other | Source: Ambulatory Visit | Attending: Nephrology | Admitting: Nephrology

## 2020-10-29 DIAGNOSIS — R809 Proteinuria, unspecified: Secondary | ICD-10-CM | POA: Insufficient documentation

## 2020-10-29 DIAGNOSIS — E1129 Type 2 diabetes mellitus with other diabetic kidney complication: Secondary | ICD-10-CM | POA: Diagnosis present

## 2020-10-29 DIAGNOSIS — E1122 Type 2 diabetes mellitus with diabetic chronic kidney disease: Secondary | ICD-10-CM | POA: Diagnosis present

## 2020-11-20 ENCOUNTER — Other Ambulatory Visit: Payer: Self-pay

## 2020-11-20 ENCOUNTER — Encounter: Payer: Medicare Other | Attending: Nephrology | Admitting: Nutrition

## 2020-11-20 VITALS — Ht 63.5 in | Wt 184.4 lb

## 2020-11-20 DIAGNOSIS — E782 Mixed hyperlipidemia: Secondary | ICD-10-CM | POA: Diagnosis present

## 2020-11-20 DIAGNOSIS — E118 Type 2 diabetes mellitus with unspecified complications: Secondary | ICD-10-CM | POA: Diagnosis not present

## 2020-11-20 DIAGNOSIS — E1165 Type 2 diabetes mellitus with hyperglycemia: Secondary | ICD-10-CM | POA: Diagnosis present

## 2020-11-20 DIAGNOSIS — IMO0002 Reserved for concepts with insufficient information to code with codable children: Secondary | ICD-10-CM

## 2020-11-20 DIAGNOSIS — N183 Chronic kidney disease, stage 3 unspecified: Secondary | ICD-10-CM | POA: Diagnosis present

## 2020-11-20 NOTE — Progress Notes (Signed)
Medical Nutrition Therapy  Appointment Start time:  0830  Appointment End time:  0930  Primary concerns today: DM and Obesity, renal issues Referral diagnosis: E11.8, E66.9 Preferred learning style:  no preference indicated   Learning readiness:  ready,    NUTRITION ASSESSMENT   Anthropometrics  Wt Readings from Last 3 Encounters:  11/20/20 184 lb 6.4 oz (83.6 kg)  03/17/20 194 lb (88 kg)  02/15/20 195 lb 6.4 oz (88.6 kg)   Ht Readings from Last 3 Encounters:  11/20/20 5' 3.5" (1.613 m)  03/17/20 5' 4"  (1.626 m)  02/15/20 5' 3"  (1.6 m)   Body mass index is 32.15 kg/m. @BMIFA @ Facility age limit for growth percentiles is 20 years. Facility age limit for growth percentiles is 20 years.    Clinical Medical Hx: DM Type 2, HTN, CHF and Obesity. Medications: . Labs:   A1C 8.9%, Phos 2.6  mo ago    Glucose 65 - 99 mg/dL 153 High    BUN 8 - 27 mg/dL 20   Creatinine 0.57 - 1.00 mg/dL 0.81   eGFR CKD-EPI CR 2021 >59 mL/min/1.73 74   BUN/Creatinine Ratio 12 - 28 25   Sodium 134 - 144 mmol/L 139   Potassium 3.5 - 5.2 mmol/L 3.7   Chloride 96 - 106 mmol/L 98   Bicarbonate (CO2) 20 - 29 mmol/L 26   Calcium 8.7 - 10.3 mg/dL 10.1   Total Protein 6.0 - 8.5 g/dL 6.4   Albumin 3.7 - 4.7 g/dL 4.4   Globulin 1.5 - 4.5 g/dL 2.0   A/G Ratio 1.2 - 2.2 2.2   Total Bilirubin 0.0 - 1.2 mg/dL 0.4   Alkaline Phosphatase 44 - 121 IU/L 66   AST (SGOT) 0 - 40 IU/L 18   ALT (SGPT) 0 - 32 IU/L 11   Resulting Agency  [facility]   Not Estab. mg/dL 16.6    Urine Protein/Creatinine Ratio 0 - 200 mg/g creat 433    Ref Range & Units 1 mo ago Comments  Uric Acid 3.1 - 7.9 mg/dL 3.9      Ref Range & Units 1 mo ago Comments  Creatinine, Ur Not Estab. mg/dL 38.3    Creatinine, 24H Ur 800 - 1800 mg/24 hr 670 Low     Creatinine renal clearance 88 - 128 mL/min 57 Low       Takes 14 units of Toujeo in am and bedtime. Humalog with meals on sliding scale Notable Signs/Symptoms: Stress and nerves due  to worrying about her family and her health.   Lifestyle & Dietary Hx  Estimated daily fluid intake: 64  oz Supplements: Omega fish oil, VIt C, Vit D 3,  Zinc, Biotin Sleep: .7/8 Stress / self-care: health concerns Current average weekly physical activity: ADL  24-Hr Dietary Recall First Meal: cherrios with milk 1/2 c, or eggs and 1 slice toast, Snack:  Second Meal: Spaghetti, - Snack:  Third Meal: Meat and some vegetables or a salad, water Snack:  Beverages: water  Estimated Energy Needs Calories: 1200 Carbohydrate: 135 g Protein: 90 g Fat: 33g   NUTRITION DIAGNOSIS  NB-1.1 Food and nutrition-related knowledge deficit As related to Diabetes.  As evidenced by A1C 8.9% .   NUTRITION INTERVENTION  Nutrition education (E-1) on the following topics:  Nutrition and Diabetes education provided on My Plate, CHO counting, meal planning, portion sizes, timing of meals, avoiding snacks between meals unless having a low blood sugar, target ranges for A1C and blood sugars, signs/symptoms and treatment of  hyper/hypoglycemia, monitoring blood sugars, taking medications as prescribed, benefits of exercising 30 minutes per day and prevention of complications of DM. Low Salt diet, protein choices, renal diet  Handouts Provided Include  My Plate Meal Plan Card Diabetes Instructions.   Learning Style & Readiness for Change Teaching method utilized: Visual & Auditory  Demonstrated degree of understanding via: Teach Back  Barriers to learning/adherence to lifestyle change: none  Goals Established by Pt  Cut out snacking at night- only eat veggies if snacking Cut out sweets Eat meals on time. Follow MyPlate  Keep drinking water Increase walking in  house safely. Use cane or something to help with stability and reduce risk of falls.   MONITORING & EVALUATION Dietary intake, weekly physical activity, and blood sugars  in 1 month.  Next Steps  Patient is to work on eating meals on  time and avoiding processed and empty calorie foods.

## 2020-11-20 NOTE — Patient Instructions (Addendum)
Goals  Cut out snacking at night- only eat veggies if snacking Cut out sweets Eat meals on time. Follow MyPlate  Keep drinking water Increase walking in  house safely. Use cane or something to help with stability and reduce risk of falls.

## 2020-12-03 ENCOUNTER — Encounter: Payer: Self-pay | Admitting: Nutrition

## 2020-12-17 ENCOUNTER — Other Ambulatory Visit (HOSPITAL_COMMUNITY): Payer: Self-pay | Admitting: Adult Health Nurse Practitioner

## 2020-12-17 DIAGNOSIS — I6523 Occlusion and stenosis of bilateral carotid arteries: Secondary | ICD-10-CM

## 2020-12-25 ENCOUNTER — Encounter: Payer: Self-pay | Admitting: Nutrition

## 2020-12-25 ENCOUNTER — Encounter: Payer: Medicare Other | Attending: Internal Medicine | Admitting: Nutrition

## 2020-12-25 ENCOUNTER — Other Ambulatory Visit: Payer: Self-pay

## 2020-12-25 VITALS — Wt 185.0 lb

## 2020-12-25 DIAGNOSIS — E669 Obesity, unspecified: Secondary | ICD-10-CM | POA: Diagnosis present

## 2020-12-25 DIAGNOSIS — E1165 Type 2 diabetes mellitus with hyperglycemia: Secondary | ICD-10-CM | POA: Insufficient documentation

## 2020-12-25 DIAGNOSIS — E118 Type 2 diabetes mellitus with unspecified complications: Secondary | ICD-10-CM | POA: Insufficient documentation

## 2020-12-25 DIAGNOSIS — E782 Mixed hyperlipidemia: Secondary | ICD-10-CM | POA: Diagnosis present

## 2020-12-25 DIAGNOSIS — N183 Chronic kidney disease, stage 3 unspecified: Secondary | ICD-10-CM | POA: Diagnosis present

## 2020-12-25 DIAGNOSIS — IMO0002 Reserved for concepts with insufficient information to code with codable children: Secondary | ICD-10-CM

## 2020-12-25 DIAGNOSIS — I10 Essential (primary) hypertension: Secondary | ICD-10-CM | POA: Diagnosis present

## 2020-12-25 NOTE — Progress Notes (Signed)
Medical Nutrition Therapy 1015  End: 9390 DM Follow up  Primary concerns today: DM and Obesity, renal issues Referral diagnosis: E11.8, E66.9 Preferred learning style:  no preference indicated   Learning readiness:  ready,    NUTRITION ASSESSMENT  FBS" 84-140's. BS are improved. Still has a few in 200's due to eating late night snack or desserts. Currently taking Toujeo 14 units twice a day and  Uses 1 unit of Humalog insulin every 50 pts over 150 mg/dl for meal time insulin. A1C was 8.9% last time. Mg has been low and her doctor told her to drink more milk and yogurt.  Anthropometrics  Wt Readings from Last 3 Encounters:  11/20/20 184 lb 6.4 oz (83.6 kg)  03/17/20 194 lb (88 kg)  02/15/20 195 lb 6.4 oz (88.6 kg)   Ht Readings from Last 3 Encounters:  11/20/20 5' 3.5" (1.613 m)  03/17/20 _0  (1.626 m)  02/15/20 _1  (1.6 m)   There is no height or weight on file to calculate BMI. _2 @ Facility age limit for growth percentiles is 20 years. Facility age limit for growth percentiles is 20 years.    Clinical Medical Hx: DM Type 2, HTN, CHF and Obesity. Medications: . Labs:   A1C 8.9%, Phos 2.6  mo ago    Glucose 65 - 99 mg/dL 153 High    BUN 8 - 27 mg/dL 20   Creatinine 0.57 - 1.00 mg/dL 0.81   eGFR CKD-EPI CR 2021 >59 mL/min/1.73 74   BUN/Creatinine Ratio 12 - 28 25   Sodium 134 - 144 mmol/L 139   Potassium 3.5 - 5.2 mmol/L 3.7   Chloride 96 - 106 mmol/L 98   Bicarbonate (CO2) 20 - 29 mmol/L 26   Calcium 8.7 - 10.3 mg/dL 10.1   Total Protein 6.0 - 8.5 g/dL 6.4   Albumin 3.7 - 4.7 g/dL 4.4   Globulin 1.5 - 4.5 g/dL 2.0   A/G Ratio 1.2 - 2.2 2.2   Total Bilirubin 0.0 - 1.2 mg/dL 0.4   Alkaline Phosphatase 44 - 121 IU/L 66   AST (SGOT) 0 - 40 IU/L 18   ALT (SGPT) 0 - 32 IU/L 11   Resulting Agency  [facility]   Not Estab. mg/dL 16.6    Urine Protein/Creatinine Ratio 0 - 200 mg/g creat 433    Ref Range & Units 1 mo ago Comments  Uric Acid 3.1 - 7.9 mg/dL 3.9       Ref Range & Units 1 mo ago Comments  Creatinine, Ur Not Estab. mg/dL 38.3    Creatinine, 24H Ur 800 - 1800 mg/24 hr 670 Low     Creatinine renal clearance 88 - 128 mL/min 57 Low       Takes 14 units of Toujeo in am and bedtime. Humalog with meals on sliding scale Notable Signs/Symptoms: Stress and nerves due to worrying about her family and her health.   Lifestyle & Dietary Hx  Estimated daily fluid intake: 64  oz Supplements: Omega fish oil, VIt C, Vit D 3,  Zinc, Biotin Sleep: .7/8 Stress / self-care: health concerns Current average weekly physical activity: ADL  24-Hr Dietary Recall First Meal: cherrios with milk 1/2 c, or eggs and 1 slice toast, water, Snack:  Second leftovers or sandwich, water Snack:  Third Meal: [place] Sandwich ,pound [employer], water Snack:  Beverages: water  Estimated Energy Needs Calories: 1200 Carbohydrate: 135 g Protein: 90 g Fat: 33g   NUTRITION DIAGNOSIS  NB-1.1 Food and nutrition-related  knowledge deficit As related to Diabetes.  As evidenced by A1C 8.9% .   NUTRITION INTERVENTION  Nutrition education (E-1) on the following topics:  Nutrition and Diabetes education provided on My Plate, CHO counting, meal planning, portion sizes, timing of meals, avoiding snacks between meals unless having a low blood sugar, target ranges for A1C and blood sugars, signs/symptoms and treatment of hyper/hypoglycemia, monitoring blood sugars, taking medications as prescribed, benefits of exercising 30 minutes per day and prevention of complications of DM. Low Salt diet, protein choices, renal diet  Handouts Provided Include  My Plate Meal Plan Card Diabetes Instructions.   Learning Style & Readiness for Change Teaching method utilized: Visual & Auditory  Demonstrated degree of understanding via: Teach Back  Barriers to learning/adherence to lifestyle change: none  Goals Established by Pt Keep eating more lower carb vegetables. Cut out snacking at  night- only eat veggies if snacking Cut out sweets Eat meals on time. Follow MyPlate  Keep drinking water Increase walking in  house safely. Use cane or something to help with stability and reduce risk of falls.   MONITORING & EVALUATION Dietary intake, weekly physical activity, and blood sugars  in 1 month.  Next Steps  Patient is to work on eating meals on time and avoiding processed and empty calorie foods.

## 2020-12-25 NOTE — Patient Instructions (Signed)
Goals Established by Pt Keep eating more lower carb vegetables. Cut out snacking at night- only eat veggies if snacking Cut out sweets Eat meals on time. Follow MyPlate  Keep drinking water Increase walking in  house safely. Use cane or something to help with stability and reduce risk of falls.

## 2020-12-26 ENCOUNTER — Ambulatory Visit (HOSPITAL_COMMUNITY)
Admission: RE | Admit: 2020-12-26 | Discharge: 2020-12-26 | Disposition: A | Discharge status: Home / Self Care | Payer: Medicare Other | Source: Ambulatory Visit | Attending: Adult Health Nurse Practitioner | Admitting: Adult Health Nurse Practitioner

## 2020-12-26 DIAGNOSIS — I6523 Occlusion and stenosis of bilateral carotid arteries: Secondary | ICD-10-CM | POA: Diagnosis present

## 2020-12-30 ENCOUNTER — Encounter: Payer: Self-pay | Admitting: Nutrition

## 2020-12-30 ENCOUNTER — Telehealth: Payer: Self-pay | Admitting: Family Medicine

## 2020-12-30 NOTE — Telephone Encounter (Signed)
Checking percert on the following patient for testing scheduled at [facility].     ECHO   03/09/2021

## 2020-12-31 ENCOUNTER — Ambulatory Visit: Payer: Medicare Other | Admitting: Cardiology

## 2021-03-09 ENCOUNTER — Ambulatory Visit (HOSPITAL_COMMUNITY): Payer: Medicare Other

## 2021-04-01 ENCOUNTER — Ambulatory Visit: Payer: Medicare Other | Admitting: Cardiology

## 2021-04-06 ENCOUNTER — Ambulatory Visit (HOSPITAL_COMMUNITY)
Admission: RE | Admit: 2021-04-06 | Discharge: 2021-04-06 | Disposition: A | Discharge status: Home / Self Care | Payer: Medicare Other | Source: Ambulatory Visit | Attending: Family Medicine | Admitting: Family Medicine

## 2021-04-06 DIAGNOSIS — R0600 Dyspnea, unspecified: Secondary | ICD-10-CM | POA: Insufficient documentation

## 2021-04-06 DIAGNOSIS — R0609 Other forms of dyspnea: Secondary | ICD-10-CM | POA: Diagnosis not present

## 2021-04-06 LAB — ECHOCARDIOGRAM COMPLETE
AR max vel: 1.17 cm2
AV Area VTI: 1.19 cm2
AV Area mean vel: 1.05 cm2
AV Mean grad: 11.5 mmHg
AV Peak grad: 20.3 mmHg
Ao pk vel: 2.25 m/s
Area-P 1/2: 2.2 cm2
S' Lateral: 3.5 cm

## 2021-04-06 NOTE — Progress Notes (Signed)
*  PRELIMINARY RESULTS* Echocardiogram 2D Echocardiogram has been performed.  Stacey Drain 04/06/2021, 2:47 PM

## 2021-04-10 ENCOUNTER — Telehealth: Payer: Self-pay | Admitting: *Deleted

## 2021-04-10 ENCOUNTER — Telehealth: Payer: Self-pay | Admitting: Family Medicine

## 2021-04-10 ENCOUNTER — Encounter: Payer: Self-pay | Admitting: *Deleted

## 2021-04-10 DIAGNOSIS — R931 Abnormal findings on diagnostic imaging of heart and coronary circulation: Secondary | ICD-10-CM

## 2021-04-10 NOTE — Telephone Encounter (Signed)
Checking percert on the following patient for testing scheduled at [facility].     LEXISCAN   04/16/2021

## 2021-04-10 NOTE — Telephone Encounter (Signed)
Lesle Chris, LPN  85/11/3147  9:19 AM EDT Back to Top    Notified, copy to pcp.  She agrees to doing the stress test. Will enter order & send to pcc for scheduling.  Will also send instruction letter via mychart.

## 2021-04-10 NOTE — Telephone Encounter (Signed)
-----   Message from Netta Neat., NP sent at 04/07/2021  8:23 AM EDT ----- Please call the patient and  let her know the echocardiogram shows she has good pumping function of the heart. The main pumping chamber is a little stiff and has trouble relaxing. Important to keep BP at or below 130/80. There were some what we refer to as wall motion changes which not present on previous echocardiogram. This can sometimes mean there is some limitation of blood flow through the coronary arteries of the heart. She has a  very minor leak in one of her valves which is age related. The replaced aortic valve looks good. May need to get a stress test to make sure there is no issue with decreased blood flow through the coronary arteries to see if the breathing issue may be related to lack of blood flow through the hearts arteries. If she is ok with that go ahead and schedule a Lexiscan stress test to rule out coronary artery disease.  Netta Neat, NP  04/07/2021 8:05 AM

## 2021-04-13 ENCOUNTER — Telehealth: Payer: Self-pay | Admitting: Cardiology

## 2021-04-13 NOTE — Telephone Encounter (Signed)
  Pt is requesting to get a call back from a nurse, she said she have several questions about her upcoming stress test

## 2021-04-13 NOTE — Telephone Encounter (Signed)
Discussed medications to hold prior to lexi - holding her Furosemide & diabetes medications.  She verbalized understanding.

## 2021-04-16 ENCOUNTER — Encounter (HOSPITAL_COMMUNITY)
Admission: RE | Admit: 2021-04-16 | Discharge: 2021-04-16 | Disposition: A | Discharge status: Home / Self Care | Payer: Medicare Other | Source: Ambulatory Visit | Attending: Family Medicine | Admitting: Family Medicine

## 2021-04-16 ENCOUNTER — Encounter (HOSPITAL_COMMUNITY): Payer: Self-pay

## 2021-04-16 ENCOUNTER — Other Ambulatory Visit: Payer: Self-pay

## 2021-04-16 ENCOUNTER — Ambulatory Visit (HOSPITAL_COMMUNITY)
Admission: RE | Admit: 2021-04-16 | Discharge: 2021-04-16 | Disposition: A | Discharge status: Home / Self Care | Payer: Medicare Other | Source: Ambulatory Visit | Attending: Family Medicine | Admitting: Family Medicine

## 2021-04-16 DIAGNOSIS — R0600 Dyspnea, unspecified: Secondary | ICD-10-CM | POA: Diagnosis not present

## 2021-04-16 DIAGNOSIS — R931 Abnormal findings on diagnostic imaging of heart and coronary circulation: Secondary | ICD-10-CM

## 2021-04-16 DIAGNOSIS — I4510 Unspecified right bundle-branch block: Secondary | ICD-10-CM | POA: Diagnosis not present

## 2021-04-16 HISTORY — DX: Unspecified asthma, uncomplicated: J45.909

## 2021-04-16 LAB — NM MYOCAR MULTI W/SPECT W/WALL MOTION / EF
LV dias vol: 58 mL (ref 46–106)
LV sys vol: 28 mL
Nuc Stress EF: 51 %
Peak HR: 110 {beats}/min
RATE: 0.5
Rest HR: 88 {beats}/min
Rest Nuclear Isotope Dose: 8.2 mCi
SDS: 5
SRS: 4
SSS: 9
ST Depression (mm): 0 mm
Stress Nuclear Isotope Dose: 27 mCi
TID: 1.23

## 2021-04-16 MED ORDER — TECHNETIUM TC 99M TETROFOSMIN IV KIT
30.0000 | PACK | Freq: Once | INTRAVENOUS | Status: AC | PRN
  Administered 2021-04-16: 27 via INTRAVENOUS

## 2021-04-16 MED ORDER — TECHNETIUM TC 99M TETROFOSMIN IV KIT
10.0000 | PACK | Freq: Once | INTRAVENOUS | Status: AC | PRN
  Administered 2021-04-16: 8.2 via INTRAVENOUS

## 2021-04-16 MED ORDER — SODIUM CHLORIDE FLUSH 0.9 % IV SOLN
INTRAVENOUS | Status: AC
  Administered 2021-04-16: 10 mL via INTRAVENOUS
  Filled 2021-04-16: qty 10

## 2021-04-16 MED ORDER — REGADENOSON 0.4 MG/5ML IV SOLN
INTRAVENOUS | Status: AC
  Administered 2021-04-16: 0.4 mg via INTRAVENOUS
  Filled 2021-04-16: qty 5

## 2021-04-20 ENCOUNTER — Telehealth: Payer: Self-pay | Admitting: *Deleted

## 2021-04-20 NOTE — Telephone Encounter (Signed)
Lesle Chris, LPN  99/35/7017 12:12 PM EST Back to Top    Notified, copy to pcp.

## 2021-04-20 NOTE — Telephone Encounter (Signed)
-----   Message from Netta Neat., NP sent at 04/17/2021 11:35 AM EST ----- Please call the patient and let her know the stress test showed she had findings that were consistent with a prior heart attack per the interpreting physician.  The physician determined the stress test to be low to intermediate risk.  If she continues with shortness of breath and or or chest pain may need to go ahead and get a cardiac catheterization given recent findings.  Netta Neat, NP  04/17/2021 11:33 AM

## 2021-05-25 ENCOUNTER — Encounter: Payer: Self-pay | Admitting: Cardiology

## 2021-05-25 ENCOUNTER — Ambulatory Visit: Payer: Medicare Other | Admitting: Cardiology

## 2021-05-25 ENCOUNTER — Ambulatory Visit: Payer: Medicare Other | Admitting: Nutrition

## 2021-05-25 VITALS — BP 128/72 | HR 81 | Ht 62.0 in | Wt 180.6 lb

## 2021-05-25 DIAGNOSIS — Z953 Presence of xenogenic heart valve: Secondary | ICD-10-CM

## 2021-05-25 DIAGNOSIS — I259 Chronic ischemic heart disease, unspecified: Secondary | ICD-10-CM

## 2021-05-25 NOTE — Progress Notes (Signed)
Cardiology Office Note  Date: 05/25/2021   ID: Kathryn Morgan, DOB 1941-02-20, MRN [id number]  PCP:  Roe Rutherford, NP  Cardiologist:  Nona Dell, MD Electrophysiologist:  None   Chief Complaint  Patient presents with   Cardiac follow-up    History of Present Illness: Kathryn Morgan is a 80 y.o. female last seen in October 2021 by Mr. Vincenza Hews NP.  She is here with her daughter for a follow-up visit.  She tells me that she has felt less short of breath recently, no obvious chest tightness or palpitations with activity.  I reviewed her medications which are outlined below and stable from a cardiac perspective.  She is still following with Ms. Arturo Morton NP for PCP.  Follow-up echocardiogram in October showed normal LVEF at 55 to 60% with mild diastolic dysfunction.  She did have wall motion abnormalities suggesting of underlying ischemic heart disease.  Aortic prosthesis was stable with normal mean gradient and no significant regurgitation.  Follow-up Myoview in November showed evidence of prior inferolateral infarct scar with mild to moderate peri-infarct ischemia.  Cardiac catheterization prior to valve replacement in 2013 showed no significant CAD.  She most likely has had progression in atherosclerosis over the last 10 years.  We discussed this today.  Past Medical History:  Diagnosis Date   Aortic stenosis    Status post 52mm Edwards Magna Ease pericardial tissue valve   Asthma    Chronic kidney disease    COPD (chronic obstructive pulmonary disease) (HCC)    Depression    Dyslipidemia    Essential hypertension    Gallstone pancreatitis    2005   GERD (gastroesophageal reflux disease)    History of cardiomyopathy    Necrotizing pancreatitis    Neuropathy    Osteoporosis    Rhabdomyolysis    Rheumatic fever    Type 2 diabetes mellitus (HCC)    Insulin pump    Past Surgical History:  Procedure Laterality Date   AORTIC VALVE REPLACEMENT  08/27/2011   Procedure:  AORTIC VALVE REPLACEMENT (AVR);  Surgeon: Purcell Nails, MD;  Location: [facility] OR;  Service: Open Heart Surgery;  Laterality: N/A;   CHOLECYSTECTOMY     Debridement pancreas     ESOPHAGOGASTRODUODENOSCOPY  04/21/04   Normal esophagus/normal D1,D2; attempted  transgastric drainage/stent placement of pancreatic pseudocyst, no obvious location for gastrotomy, therefore transferred to [facility]   LEFT AND RIGHT HEART CATHETERIZATION WITH CORONARY ANGIOGRAM N/A 06/03/2011   Procedure: LEFT AND RIGHT HEART CATHETERIZATION WITH CORONARY ANGIOGRAM;  Surgeon: Kathleene Hazel, MD;  Location: [facility] CATH LAB;  Service: Cardiovascular;  Laterality: N/A;  Right Heart Cath also   PANCREATIC PSEUDOCYST DRAINAGE      Current Outpatient Medications  Medication Sig Dispense Refill   albuterol (PROVENTIL HFA;VENTOLIN HFA) 108 (90 BASE) MCG/ACT inhaler Inhale 2 puffs into the lungs every 6 (six) hours as needed for wheezing or shortness of breath.     albuterol (PROVENTIL) (2.5 MG/3ML) 0.083% nebulizer solution Take 2.5 mg by nebulization as needed for wheezing or shortness of breath.     aspirin EC 81 MG tablet Take 81 mg by mouth daily.     b complex vitamins capsule Take 1 capsule by mouth daily.     carvedilol (COREG) 12.5 MG tablet Take 6.25 mg by mouth 2 (two) times daily.     Cholecalciferol (VITAMIN D3 PO) Take 1 tablet by mouth daily.     Clobetasol Prop Crea-Coal Tar (CLOBETA CREAM EX) Apply topically  as needed.     febuxostat (ULORIC) 40 MG tablet Take 1 tablet by mouth daily.     fluticasone (FLONASE) 50 MCG/ACT nasal spray Place 1 spray into both nostrils daily.     furosemide (LASIX) 40 MG tablet Take 60 mg by mouth daily.     Glycopyrrolate-Formoterol 9-4.8 MCG/ACT AERO Inhale 2 puffs into the lungs 2 (two) times daily.      Insulin Lispro (HUMALOG KWIKPEN [place]) Inject 6-8 Units into the skin as needed.     metFORMIN (GLUCOPHAGE) 1000 MG tablet Take 1,000 mg by mouth 2 (two) times daily with a meal.       sacubitril-valsartan (ENTRESTO) 97-103 MG Take 1 tablet by mouth 2 (two) times daily.     simvastatin (ZOCOR) 20 MG tablet Take 20 mg by mouth daily.     TOUJEO SOLOSTAR 300 UNIT/ML SOPN Inject 28 Units into the skin daily.     vitamin C (ASCORBIC ACID) 500 MG tablet Take 500 mg by mouth daily.     Zinc 50 MG TABS Take 1 tablet by mouth daily.     fish oil-omega-3 fatty acids 1000 MG capsule Take 2,500 mg by mouth 2 (two) times daily.  (Patient not taking: Reported on 05/25/2021)     No current facility-administered medications for this visit.   Allergies:  Amlodipine, Codeine, Promethazine hcl, Ace inhibitors, Adhesive [tape], Hydralazine, and Levofloxacin   ROS: No palpitations or syncope.  Physical Exam: VS:  BP 128/72    Pulse 81    Ht 5\' 2"  (1.575 m)    Wt 180 lb 9.6 oz (81.9 kg)    SpO2 94%    BMI 33.03 kg/m , BMI Body mass index is 33.03 kg/m.  Wt Readings from Last 3 Encounters:  05/25/21 180 lb 9.6 oz (81.9 kg)  12/25/20 185 lb (83.9 kg)  11/20/20 184 lb 6.4 oz (83.6 kg)    General: Patient appears comfortable at rest. HEENT: Conjunctiva and lids normal, wearing a mask. Neck: Supple, no elevated JVP or carotid bruits, no thyromegaly. Lungs: Clear to auscultation, nonlabored breathing at rest. Cardiac: Regular rate and rhythm, no S3, 2/6 systolic murmur. Extremities: No pitting edema.  ECG:  An ECG dated 03/17/2020 was personally reviewed today and demonstrated:  Sinus with leftward axis and right bundle branch block.  Recent Labwork:  No interval lab work for review today.  Other Studies Reviewed Today:  Carotid Dopplers 12/26/2020: IMPRESSION: 1. Right carotid artery system: Less than 50% stenosis secondary to moderate multifocal atherosclerotic plaque formation.   2. Left carotid artery system: Less than 50% stenosis secondary to moderate multifocal atherosclerotic plaque formation.   3.  Vertebral artery system: Patent with antegrade flow  bilaterally.  Echocardiogram 04/06/2021:  1. Left ventricular ejection fraction, by estimation, is 55 to 60%. The  left ventricle has normal function. The left ventricle demonstrates  regional wall motion abnormalities (see scoring diagram/findings for  description). The left ventricular internal  cavity size was mildly dilated. Left ventricular diastolic parameters are  consistent with Grade I diastolic dysfunction (impaired relaxation).   2. Right ventricular systolic function is normal. The right ventricular  size is normal. Tricuspid regurgitation signal is inadequate for assessing  PA pressure.   3. The mitral valve is grossly normal. Trivial mitral valve  regurgitation.   4. The aortic valve has been repaired/replaced. Aortic valve  regurgitation is not visualized. There is a 23 mm pericardial valve  present in the aortic position. Aortic valve mean gradient measures  11.5  mmHg.   5. The inferior vena cava is normal in size with greater than 50%  respiratory variability, suggesting right atrial pressure of 3 mmHg.   [place] 04/16/2021:   Findings are consistent with prior inferolateral myocardial infarction with mild to moderate  peri-infarct ischemia. Low to intermediate risk study   No ST deviation was noted.   LV perfusion is abnormal.   Left ventricular function is normal. Nuclear stress EF: 51 %. The left ventricular ejection fraction low is normal (51%). End diastolic cavity size is normal.  Assessment and Plan:  1.  History of aortic stenosis status post bioprosthetic AVR in 2013.  Recent follow-up echocardiogram reveals LVEF 55 to 60% range with normally functioning prosthesis and mean aortic gradient.  2.  Probable interval development of ischemic heart disease.  Cardiac catheterization showed no significant CAD prior to valve surgery in 2013.  She has wall motion abnormalities by recent echocardiogram and Myoview indicating inferolateral infarct scar with mild  to moderate peri-infarct ischemia.  She reports stable symptoms at this time and we will continue with medical therapy and observation.  I did talk with her about follow-up cardiac catheterization if symptoms worsen however.  She is currently on aspirin, Coreg, Entresto, Lasix, and Zocor.  Medication Adjustments/Labs and Tests Ordered: Current medicines are reviewed at length with the patient today.  Concerns regarding medicines are outlined above.   Tests Ordered: No orders of the defined types were placed in this encounter.   Medication Changes: No orders of the defined types were placed in this encounter.   Disposition:  Follow up  6 months.  Signed, Satira Sark, MD, [facility] 05/25/2021 4:54 PM    [facility] [place], [place], [place] [address] Phone: [phone]; Fax: [phone]

## 2021-05-25 NOTE — Patient Instructions (Addendum)

## 2021-06-29 ENCOUNTER — Encounter (INDEPENDENT_AMBULATORY_CARE_PROVIDER_SITE_OTHER): Payer: Self-pay | Admitting: *Deleted

## 2021-07-06 ENCOUNTER — Other Ambulatory Visit (HOSPITAL_COMMUNITY): Payer: Self-pay | Admitting: Nephrology

## 2021-07-16 ENCOUNTER — Other Ambulatory Visit: Payer: Self-pay

## 2021-07-16 ENCOUNTER — Encounter (HOSPITAL_COMMUNITY)
Admission: RE | Admit: 2021-07-16 | Discharge: 2021-07-16 | Disposition: A | Discharge status: Home / Self Care | Payer: Medicare Other | Source: Ambulatory Visit | Attending: Nephrology | Admitting: Nephrology

## 2021-07-16 MED ORDER — TECHNETIUM TC 99M SESTAMIBI GENERIC - CARDIOLITE
25.0000 | Freq: Once | INTRAVENOUS | Status: AC | PRN
  Administered 2021-07-16: 25.5 via INTRAVENOUS

## 2021-08-13 ENCOUNTER — Other Ambulatory Visit (HOSPITAL_COMMUNITY): Payer: Self-pay | Admitting: Nephrology

## 2021-08-13 DIAGNOSIS — E210 Primary hyperparathyroidism: Secondary | ICD-10-CM

## 2021-08-17 ENCOUNTER — Other Ambulatory Visit: Payer: Self-pay

## 2021-08-17 ENCOUNTER — Ambulatory Visit (HOSPITAL_COMMUNITY)
Admission: RE | Admit: 2021-08-17 | Discharge: 2021-08-17 | Disposition: A | Discharge status: Home / Self Care | Payer: Medicare Other | Source: Ambulatory Visit | Attending: Nephrology | Admitting: Nephrology

## 2021-08-17 DIAGNOSIS — E210 Primary hyperparathyroidism: Secondary | ICD-10-CM | POA: Insufficient documentation

## 2021-08-28 ENCOUNTER — Other Ambulatory Visit: Payer: Self-pay

## 2021-08-28 ENCOUNTER — Ambulatory Visit (HOSPITAL_COMMUNITY)
Admission: RE | Admit: 2021-08-28 | Discharge: 2021-08-28 | Disposition: A | Discharge status: Home / Self Care | Payer: Medicare Other | Source: Ambulatory Visit | Attending: Adult Health Nurse Practitioner | Admitting: Adult Health Nurse Practitioner

## 2021-08-28 ENCOUNTER — Other Ambulatory Visit (HOSPITAL_COMMUNITY): Payer: Self-pay | Admitting: Adult Health Nurse Practitioner

## 2021-08-28 DIAGNOSIS — R0902 Hypoxemia: Secondary | ICD-10-CM

## 2021-09-01 ENCOUNTER — Ambulatory Visit (INDEPENDENT_AMBULATORY_CARE_PROVIDER_SITE_OTHER): Payer: Medicare Other | Admitting: Gastroenterology

## 2021-09-03 ENCOUNTER — Telehealth: Payer: Self-pay | Admitting: Cardiology

## 2021-09-03 NOTE — Telephone Encounter (Signed)
Reports not having active bleeding but will be having an iron infusion. Chart reviewed and shows dx of iron deficiency anemia as the cause for iron infusion. Advised that aspirin didn't need to be held for this unless she has active bleeding causing low iron level or her nephrologist recommended aspirin be held. Verbalized understanding.  ?

## 2021-09-03 NOTE — Telephone Encounter (Signed)
? ?  Pt c/o medication issue: ? ?1. Name of Medication: aspirin EC 81 MG tablet ? ?2. How are you currently taking this medication (dosage and times per day)? Take 81 mg by mouth daily. ? ?3. Are you having a reaction (difficulty breathing--STAT)?  ? ?4. What is your medication issue? Pt wanted to ask Dr. Diona Browner, if she can hold off taking Asprin. She said she is having issue with her blood and trying to wait for iron transfusion. She thinks the holding the aspirin may help and wanted to speak with the nurse to discuss ?

## 2021-09-07 NOTE — Progress Notes (Deleted)
RESCHEDULE 

## 2021-09-08 ENCOUNTER — Encounter (HOSPITAL_COMMUNITY): Payer: Self-pay | Admitting: *Deleted

## 2021-09-08 ENCOUNTER — Encounter (HOSPITAL_COMMUNITY): Payer: Medicare Other | Admitting: Hematology

## 2021-09-08 ENCOUNTER — Emergency Department (HOSPITAL_COMMUNITY): Payer: Medicare Other

## 2021-09-08 ENCOUNTER — Inpatient Hospital Stay (HOSPITAL_COMMUNITY)
Admission: EM | Admit: 2021-09-08 | Discharge: 2021-10-05 | DRG: 193 | Disposition: E | Payer: Medicare Other | Attending: Internal Medicine | Admitting: Internal Medicine

## 2021-09-08 ENCOUNTER — Other Ambulatory Visit: Payer: Self-pay

## 2021-09-08 DIAGNOSIS — E1140 Type 2 diabetes mellitus with diabetic neuropathy, unspecified: Secondary | ICD-10-CM | POA: Diagnosis present

## 2021-09-08 DIAGNOSIS — R042 Hemoptysis: Secondary | ICD-10-CM | POA: Diagnosis not present

## 2021-09-08 DIAGNOSIS — J9622 Acute and chronic respiratory failure with hypercapnia: Secondary | ICD-10-CM | POA: Diagnosis present

## 2021-09-08 DIAGNOSIS — J449 Chronic obstructive pulmonary disease, unspecified: Secondary | ICD-10-CM | POA: Diagnosis not present

## 2021-09-08 DIAGNOSIS — E1165 Type 2 diabetes mellitus with hyperglycemia: Secondary | ICD-10-CM | POA: Diagnosis present

## 2021-09-08 DIAGNOSIS — R590 Localized enlarged lymph nodes: Secondary | ICD-10-CM | POA: Diagnosis present

## 2021-09-08 DIAGNOSIS — J438 Other emphysema: Secondary | ICD-10-CM | POA: Diagnosis not present

## 2021-09-08 DIAGNOSIS — I429 Cardiomyopathy, unspecified: Secondary | ICD-10-CM | POA: Diagnosis present

## 2021-09-08 DIAGNOSIS — Z90411 Acquired partial absence of pancreas: Secondary | ICD-10-CM

## 2021-09-08 DIAGNOSIS — T508X5A Adverse effect of diagnostic agents, initial encounter: Secondary | ICD-10-CM | POA: Diagnosis not present

## 2021-09-08 DIAGNOSIS — Z8249 Family history of ischemic heart disease and other diseases of the circulatory system: Secondary | ICD-10-CM

## 2021-09-08 DIAGNOSIS — Z9641 Presence of insulin pump (external) (internal): Secondary | ICD-10-CM | POA: Diagnosis present

## 2021-09-08 DIAGNOSIS — J181 Lobar pneumonia, unspecified organism: Secondary | ICD-10-CM | POA: Diagnosis present

## 2021-09-08 DIAGNOSIS — Z803 Family history of malignant neoplasm of breast: Secondary | ICD-10-CM

## 2021-09-08 DIAGNOSIS — E440 Moderate protein-calorie malnutrition: Secondary | ICD-10-CM | POA: Diagnosis present

## 2021-09-08 DIAGNOSIS — R918 Other nonspecific abnormal finding of lung field: Secondary | ICD-10-CM | POA: Diagnosis present

## 2021-09-08 DIAGNOSIS — Z953 Presence of xenogenic heart valve: Secondary | ICD-10-CM | POA: Diagnosis not present

## 2021-09-08 DIAGNOSIS — Z888 Allergy status to other drugs, medicaments and biological substances status: Secondary | ICD-10-CM

## 2021-09-08 DIAGNOSIS — Z79899 Other long term (current) drug therapy: Secondary | ICD-10-CM

## 2021-09-08 DIAGNOSIS — Z7189 Other specified counseling: Secondary | ICD-10-CM | POA: Diagnosis not present

## 2021-09-08 DIAGNOSIS — K219 Gastro-esophageal reflux disease without esophagitis: Secondary | ICD-10-CM | POA: Diagnosis present

## 2021-09-08 DIAGNOSIS — I5033 Acute on chronic diastolic (congestive) heart failure: Secondary | ICD-10-CM | POA: Diagnosis present

## 2021-09-08 DIAGNOSIS — D72829 Elevated white blood cell count, unspecified: Secondary | ICD-10-CM | POA: Diagnosis present

## 2021-09-08 DIAGNOSIS — Z6832 Body mass index (BMI) 32.0-32.9, adult: Secondary | ICD-10-CM

## 2021-09-08 DIAGNOSIS — F32A Depression, unspecified: Secondary | ICD-10-CM | POA: Diagnosis present

## 2021-09-08 DIAGNOSIS — Z7982 Long term (current) use of aspirin: Secondary | ICD-10-CM

## 2021-09-08 DIAGNOSIS — I509 Heart failure, unspecified: Secondary | ICD-10-CM | POA: Diagnosis present

## 2021-09-08 DIAGNOSIS — J811 Chronic pulmonary edema: Secondary | ICD-10-CM | POA: Diagnosis present

## 2021-09-08 DIAGNOSIS — J440 Chronic obstructive pulmonary disease with acute lower respiratory infection: Secondary | ICD-10-CM | POA: Diagnosis present

## 2021-09-08 DIAGNOSIS — K769 Liver disease, unspecified: Secondary | ICD-10-CM | POA: Diagnosis present

## 2021-09-08 DIAGNOSIS — J9610 Chronic respiratory failure, unspecified whether with hypoxia or hypercapnia: Secondary | ICD-10-CM | POA: Diagnosis present

## 2021-09-08 DIAGNOSIS — R0600 Dyspnea, unspecified: Principal | ICD-10-CM

## 2021-09-08 DIAGNOSIS — D7389 Other diseases of spleen: Secondary | ICD-10-CM | POA: Diagnosis present

## 2021-09-08 DIAGNOSIS — Z9981 Dependence on supplemental oxygen: Secondary | ICD-10-CM

## 2021-09-08 DIAGNOSIS — E782 Mixed hyperlipidemia: Secondary | ICD-10-CM | POA: Diagnosis present

## 2021-09-08 DIAGNOSIS — Z9049 Acquired absence of other specified parts of digestive tract: Secondary | ICD-10-CM

## 2021-09-08 DIAGNOSIS — F5104 Psychophysiologic insomnia: Secondary | ICD-10-CM | POA: Diagnosis present

## 2021-09-08 DIAGNOSIS — R053 Chronic cough: Secondary | ICD-10-CM | POA: Diagnosis not present

## 2021-09-08 DIAGNOSIS — E875 Hyperkalemia: Secondary | ICD-10-CM | POA: Diagnosis not present

## 2021-09-08 DIAGNOSIS — Z87891 Personal history of nicotine dependence: Secondary | ICD-10-CM

## 2021-09-08 DIAGNOSIS — T380X5A Adverse effect of glucocorticoids and synthetic analogues, initial encounter: Secondary | ICD-10-CM | POA: Diagnosis present

## 2021-09-08 DIAGNOSIS — N1411 Contrast-induced nephropathy: Secondary | ICD-10-CM | POA: Diagnosis not present

## 2021-09-08 DIAGNOSIS — Z881 Allergy status to other antibiotic agents status: Secondary | ICD-10-CM

## 2021-09-08 DIAGNOSIS — Z952 Presence of prosthetic heart valve: Secondary | ICD-10-CM

## 2021-09-08 DIAGNOSIS — Z794 Long term (current) use of insulin: Secondary | ICD-10-CM

## 2021-09-08 DIAGNOSIS — Z66 Do not resuscitate: Secondary | ICD-10-CM | POA: Diagnosis present

## 2021-09-08 DIAGNOSIS — R634 Abnormal weight loss: Secondary | ICD-10-CM | POA: Diagnosis present

## 2021-09-08 DIAGNOSIS — R54 Age-related physical debility: Secondary | ICD-10-CM | POA: Diagnosis present

## 2021-09-08 DIAGNOSIS — I10 Essential (primary) hypertension: Secondary | ICD-10-CM | POA: Diagnosis not present

## 2021-09-08 DIAGNOSIS — Z885 Allergy status to narcotic agent status: Secondary | ICD-10-CM

## 2021-09-08 DIAGNOSIS — I110 Hypertensive heart disease with heart failure: Secondary | ICD-10-CM | POA: Diagnosis present

## 2021-09-08 DIAGNOSIS — Z91048 Other nonmedicinal substance allergy status: Secondary | ICD-10-CM

## 2021-09-08 DIAGNOSIS — N179 Acute kidney failure, unspecified: Secondary | ICD-10-CM | POA: Diagnosis not present

## 2021-09-08 DIAGNOSIS — M899 Disorder of bone, unspecified: Secondary | ICD-10-CM | POA: Diagnosis present

## 2021-09-08 DIAGNOSIS — D509 Iron deficiency anemia, unspecified: Secondary | ICD-10-CM | POA: Diagnosis present

## 2021-09-08 DIAGNOSIS — J439 Emphysema, unspecified: Secondary | ICD-10-CM | POA: Diagnosis not present

## 2021-09-08 DIAGNOSIS — M810 Age-related osteoporosis without current pathological fracture: Secondary | ICD-10-CM | POA: Diagnosis present

## 2021-09-08 DIAGNOSIS — Z7951 Long term (current) use of inhaled steroids: Secondary | ICD-10-CM

## 2021-09-08 DIAGNOSIS — J189 Pneumonia, unspecified organism: Secondary | ICD-10-CM | POA: Diagnosis present

## 2021-09-08 DIAGNOSIS — Z7984 Long term (current) use of oral hypoglycemic drugs: Secondary | ICD-10-CM

## 2021-09-08 DIAGNOSIS — J9611 Chronic respiratory failure with hypoxia: Secondary | ICD-10-CM

## 2021-09-08 DIAGNOSIS — E669 Obesity, unspecified: Secondary | ICD-10-CM | POA: Diagnosis present

## 2021-09-08 DIAGNOSIS — J9621 Acute and chronic respiratory failure with hypoxia: Secondary | ICD-10-CM | POA: Diagnosis present

## 2021-09-08 DIAGNOSIS — Z515 Encounter for palliative care: Secondary | ICD-10-CM | POA: Diagnosis not present

## 2021-09-08 DIAGNOSIS — M25551 Pain in right hip: Secondary | ICD-10-CM | POA: Diagnosis present

## 2021-09-08 DIAGNOSIS — Z954 Presence of other heart-valve replacement: Secondary | ICD-10-CM

## 2021-09-08 DIAGNOSIS — Z7985 Long-term (current) use of injectable non-insulin antidiabetic drugs: Secondary | ICD-10-CM

## 2021-09-08 DIAGNOSIS — E8779 Other fluid overload: Secondary | ICD-10-CM | POA: Diagnosis not present

## 2021-09-08 DIAGNOSIS — E8770 Fluid overload, unspecified: Secondary | ICD-10-CM

## 2021-09-08 DIAGNOSIS — E119 Type 2 diabetes mellitus without complications: Secondary | ICD-10-CM

## 2021-09-08 LAB — BASIC METABOLIC PANEL
Anion gap: 10 (ref 5–15)
BUN: 29 mg/dL — ABNORMAL HIGH (ref 8–23)
CO2: 34 mmol/L — ABNORMAL HIGH (ref 22–32)
Calcium: 10.3 mg/dL (ref 8.9–10.3)
Chloride: 85 mmol/L — ABNORMAL LOW (ref 98–111)
Creatinine, Ser: 0.59 mg/dL (ref 0.44–1.00)
GFR, Estimated: >60 mL/min (ref >60)
Glucose, Bld: 208 mg/dL — ABNORMAL HIGH (ref 70–99)
Potassium: 4.8 mmol/L (ref 3.5–5.1)
Sodium: 129 mmol/L — ABNORMAL LOW (ref 135–145)

## 2021-09-08 LAB — HEPATIC FUNCTION PANEL
ALT: 18 U/L (ref 0–44)
AST: 17 U/L (ref 15–41)
Albumin: 3 g/dL — ABNORMAL LOW (ref 3.5–5.0)
Alkaline Phosphatase: 70 U/L (ref 38–126)
Bilirubin, Direct: 0.1 mg/dL (ref 0.0–0.2)
Indirect Bilirubin: 0.4 mg/dL (ref 0.3–0.9)
Total Bilirubin: 0.5 mg/dL (ref 0.3–1.2)
Total Protein: 7.1 g/dL (ref 6.5–8.1)

## 2021-09-08 LAB — CBG MONITORING, ED: Glucose-Capillary: 210 mg/dL — ABNORMAL HIGH (ref 70–99)

## 2021-09-08 LAB — CBC
HCT: 34.3 % — ABNORMAL LOW (ref 36.0–46.0)
Hemoglobin: 10.7 g/dL — ABNORMAL LOW (ref 12.0–15.0)
MCH: 25.7 pg — ABNORMAL LOW (ref 26.0–34.0)
MCHC: 31.2 g/dL (ref 30.0–36.0)
MCV: 82.5 fL (ref 80.0–100.0)
Platelets: 396 10*3/uL (ref 150–400)
RBC: 4.16 MIL/uL (ref 3.87–5.11)
RDW: 15.6 % — ABNORMAL HIGH (ref 11.5–15.5)
WBC: 12.4 10*3/uL — ABNORMAL HIGH (ref 4.0–10.5)
nRBC: 0 % (ref 0.0–0.2)

## 2021-09-08 LAB — TROPONIN I (HIGH SENSITIVITY)
Troponin I (High Sensitivity): 8 ng/L (ref <18)
Troponin I (High Sensitivity): 9 ng/L (ref <18)

## 2021-09-08 LAB — BRAIN NATRIURETIC PEPTIDE: B Natriuretic Peptide: 180 pg/mL — ABNORMAL HIGH (ref 0.0–100.0)

## 2021-09-08 LAB — GLUCOSE, CAPILLARY: Glucose-Capillary: 223 mg/dL — ABNORMAL HIGH (ref 70–99)

## 2021-09-08 MED ORDER — BISACODYL 5 MG PO TBEC
5.0000 mg | DELAYED_RELEASE_TABLET | Freq: Every day | ORAL | Status: DC | PRN
  Administered 2021-09-11 – 2021-09-14 (×3): 5 mg via ORAL
  Filled 2021-09-08 (×4): qty 1

## 2021-09-08 MED ORDER — DEXTROMETHORPHAN POLISTIREX ER 30 MG/5ML PO SUER
30.0000 mg | Freq: Two times a day (BID) | ORAL | Status: DC | PRN
  Filled 2021-09-08: qty 5

## 2021-09-08 MED ORDER — METOCLOPRAMIDE HCL 5 MG/ML IJ SOLN
10.0000 mg | Freq: Once | INTRAMUSCULAR | Status: AC
  Administered 2021-09-08: 10 mg via INTRAVENOUS
  Filled 2021-09-08: qty 2

## 2021-09-08 MED ORDER — SODIUM CHLORIDE 0.9 % IV SOLN
2.0000 g | Freq: Once | INTRAVENOUS | Status: AC
  Administered 2021-09-08: 2 g via INTRAVENOUS
  Filled 2021-09-08: qty 20

## 2021-09-08 MED ORDER — ASPIRIN EC 81 MG PO TBEC
81.0000 mg | DELAYED_RELEASE_TABLET | Freq: Every day | ORAL | Status: DC
  Administered 2021-09-08 – 2021-09-14 (×6): 81 mg via ORAL
  Filled 2021-09-08 (×8): qty 1

## 2021-09-08 MED ORDER — INSULIN GLARGINE-YFGN 100 UNIT/ML ~~LOC~~ SOLN
20.0000 [IU] | Freq: Every day | SUBCUTANEOUS | Status: DC
  Filled 2021-09-08 (×2): qty 0.2

## 2021-09-08 MED ORDER — ACETAMINOPHEN 325 MG PO TABS
650.0000 mg | ORAL_TABLET | Freq: Four times a day (QID) | ORAL | Status: DC | PRN
  Administered 2021-09-08: 650 mg via ORAL
  Filled 2021-09-08 (×2): qty 2

## 2021-09-08 MED ORDER — ARFORMOTEROL TARTRATE 15 MCG/2ML IN NEBU
15.0000 ug | INHALATION_SOLUTION | Freq: Two times a day (BID) | RESPIRATORY_TRACT | Status: DC
  Administered 2021-09-08 – 2021-09-14 (×13): 15 ug via RESPIRATORY_TRACT
  Filled 2021-09-08 (×16): qty 2

## 2021-09-08 MED ORDER — INSULIN ASPART 100 UNIT/ML IJ SOLN: 6.0000 [IU] | Freq: Three times a day (TID) | INTRAMUSCULAR | Status: DC

## 2021-09-08 MED ORDER — ACETAMINOPHEN 650 MG RE SUPP: 650.0000 mg | Freq: Four times a day (QID) | RECTAL | Status: DC | PRN

## 2021-09-08 MED ORDER — INSULIN ASPART 100 UNIT/ML IJ SOLN
0.0000 [IU] | Freq: Every day | INTRAMUSCULAR | Status: DC
  Administered 2021-09-08 – 2021-09-14 (×3): 2 [IU] via SUBCUTANEOUS

## 2021-09-08 MED ORDER — UMECLIDINIUM BROMIDE 62.5 MCG/ACT IN AEPB
1.0000 | INHALATION_SPRAY | Freq: Every day | RESPIRATORY_TRACT | Status: DC
Start: 2021-09-08 — End: 2021-09-09
  Administered 2021-09-09: 1 via RESPIRATORY_TRACT
  Filled 2021-09-08: qty 7

## 2021-09-08 MED ORDER — SODIUM CHLORIDE 0.9 % IV SOLN
500.0000 mg | Freq: Once | INTRAVENOUS | Status: AC
  Administered 2021-09-08: 500 mg via INTRAVENOUS
  Filled 2021-09-08: qty 5

## 2021-09-08 MED ORDER — IPRATROPIUM-ALBUTEROL 0.5-2.5 (3) MG/3ML IN SOLN
3.0000 mL | RESPIRATORY_TRACT | Status: DC | PRN
  Administered 2021-09-12: 3 mL via RESPIRATORY_TRACT
  Filled 2021-09-08: qty 3

## 2021-09-08 MED ORDER — ENOXAPARIN SODIUM 40 MG/0.4ML IJ SOSY
40.0000 mg | PREFILLED_SYRINGE | INTRAMUSCULAR | Status: DC
  Administered 2021-09-08 – 2021-09-14 (×7): 40 mg via SUBCUTANEOUS
  Filled 2021-09-08 (×7): qty 0.4

## 2021-09-08 MED ORDER — SODIUM CHLORIDE 0.9 % IV SOLN
500.0000 mg | INTRAVENOUS | Status: DC
  Administered 2021-09-09 – 2021-09-10 (×2): 500 mg via INTRAVENOUS
  Filled 2021-09-08 (×2): qty 5

## 2021-09-08 MED ORDER — CARVEDILOL 3.125 MG PO TABS
6.2500 mg | ORAL_TABLET | Freq: Two times a day (BID) | ORAL | Status: DC
  Administered 2021-09-08 (×2): 6.25 mg via ORAL
  Filled 2021-09-08 (×3): qty 2

## 2021-09-08 MED ORDER — IOHEXOL 300 MG/ML  SOLN
100.0000 mL | Freq: Once | INTRAMUSCULAR | Status: AC | PRN
  Administered 2021-09-08: 75 mL via INTRAVENOUS

## 2021-09-08 MED ORDER — OXYCODONE HCL 5 MG PO TABS
5.0000 mg | ORAL_TABLET | ORAL | Status: DC | PRN
  Administered 2021-09-08 – 2021-09-15 (×24): 5 mg via ORAL
  Filled 2021-09-08 (×26): qty 1

## 2021-09-08 MED ORDER — INSULIN ASPART 100 UNIT/ML IJ SOLN
0.0000 [IU] | Freq: Three times a day (TID) | INTRAMUSCULAR | Status: DC
  Administered 2021-09-09: 3 [IU] via SUBCUTANEOUS
  Administered 2021-09-09: 2 [IU] via SUBCUTANEOUS
  Administered 2021-09-09: 3 [IU] via SUBCUTANEOUS
  Administered 2021-09-10: 8 [IU] via SUBCUTANEOUS
  Administered 2021-09-10: 11 [IU] via SUBCUTANEOUS
  Administered 2021-09-10: 8 [IU] via SUBCUTANEOUS
  Administered 2021-09-11: 5 [IU] via SUBCUTANEOUS
  Administered 2021-09-11: 3 [IU] via SUBCUTANEOUS
  Administered 2021-09-11: 5 [IU] via SUBCUTANEOUS
  Administered 2021-09-12: 3 [IU] via SUBCUTANEOUS
  Administered 2021-09-12: 5 [IU] via SUBCUTANEOUS
  Administered 2021-09-12: 3 [IU] via SUBCUTANEOUS
  Administered 2021-09-13: 5 [IU] via SUBCUTANEOUS
  Administered 2021-09-13 – 2021-09-14 (×4): 3 [IU] via SUBCUTANEOUS
  Administered 2021-09-14: 5 [IU] via SUBCUTANEOUS

## 2021-09-08 MED ORDER — SODIUM CHLORIDE 0.9 % IV SOLN
2.0000 g | INTRAVENOUS | Status: AC
  Administered 2021-09-09 – 2021-09-12 (×4): 2 g via INTRAVENOUS
  Filled 2021-09-08 (×4): qty 20

## 2021-09-08 MED ORDER — SIMVASTATIN 20 MG PO TABS
20.0000 mg | ORAL_TABLET | Freq: Every day | ORAL | Status: DC
  Administered 2021-09-08 – 2021-09-14 (×7): 20 mg via ORAL
  Filled 2021-09-08: qty 1
  Filled 2021-09-08: qty 2
  Filled 2021-09-08 (×5): qty 1

## 2021-09-08 MED ORDER — ONDANSETRON HCL 4 MG/2ML IJ SOLN
4.0000 mg | Freq: Four times a day (QID) | INTRAMUSCULAR | Status: DC | PRN
  Administered 2021-09-08 – 2021-09-09 (×3): 4 mg via INTRAVENOUS
  Filled 2021-09-08 (×3): qty 2

## 2021-09-08 MED ORDER — FENTANYL CITRATE PF 50 MCG/ML IJ SOSY
12.5000 ug | PREFILLED_SYRINGE | INTRAMUSCULAR | Status: DC | PRN
  Administered 2021-09-09 – 2021-09-15 (×6): 12.5 ug via INTRAVENOUS
  Filled 2021-09-08 (×6): qty 1

## 2021-09-08 MED ORDER — TRAZODONE HCL 50 MG PO TABS
50.0000 mg | ORAL_TABLET | Freq: Every evening | ORAL | Status: DC | PRN
  Administered 2021-09-11 – 2021-09-13 (×2): 50 mg via ORAL
  Filled 2021-09-08 (×3): qty 1

## 2021-09-08 MED ORDER — FUROSEMIDE 40 MG PO TABS
60.0000 mg | ORAL_TABLET | Freq: Every day | ORAL | Status: DC
  Administered 2021-09-08 – 2021-09-10 (×3): 60 mg via ORAL
  Filled 2021-09-08 (×2): qty 1
  Filled 2021-09-08: qty 2

## 2021-09-08 MED ORDER — SACUBITRIL-VALSARTAN 97-103 MG PO TABS
1.0000 | ORAL_TABLET | Freq: Two times a day (BID) | ORAL | Status: DC
  Administered 2021-09-08: 1 via ORAL
  Filled 2021-09-08 (×7): qty 1

## 2021-09-08 MED ORDER — FEBUXOSTAT 40 MG PO TABS
40.0000 mg | ORAL_TABLET | Freq: Every day | ORAL | Status: DC
  Administered 2021-09-08 – 2021-09-14 (×7): 40 mg via ORAL
  Filled 2021-09-08 (×8): qty 1

## 2021-09-08 MED ORDER — ONDANSETRON HCL 4 MG PO TABS
4.0000 mg | ORAL_TABLET | Freq: Four times a day (QID) | ORAL | Status: DC | PRN
  Administered 2021-09-08 – 2021-09-09 (×2): 4 mg via ORAL
  Filled 2021-09-08 (×2): qty 1

## 2021-09-08 NOTE — ED Notes (Signed)
Pt stated she did not want insulin injections at this time. Pt daughter at bedside and agrees with pt that she does not want insulin. ?

## 2021-09-08 NOTE — Assessment & Plan Note (Signed)
--  concerning for malignancy ?--working up as noted  ?

## 2021-09-08 NOTE — Assessment & Plan Note (Signed)
--  unfortunately it is looking like patient may have lung cancer and this is being investigated with further work up ?--symptomatic treatment at this time  ?

## 2021-09-08 NOTE — Assessment & Plan Note (Signed)
--   resume home statin therapy  

## 2021-09-08 NOTE — Assessment & Plan Note (Signed)
--  pronounced hilar lymphadenopathy highly suspicious for malignancy  ?--Dr. Lorelle Formosa was consulted and will see in hospital ?--further recommendations to follow  ?

## 2021-09-08 NOTE — Assessment & Plan Note (Addendum)
--  follow up with oncology recommendations ?--pulmonary consult appreciated>>PET, then EBUS ?--appreciate med onc consult>>same plan  ?

## 2021-09-08 NOTE — Hospital Course (Addendum)
81 year old female former smoker with oxygen dependent COPD (3L), secondary cardiomyopathy, history of bioprosthetic aortic valve replacement, type 2 diabetes mellitus, history of necrotizing pancreatitis, status post partial pancreatectomy, osteoporosis, rheumatic fever, GERD, dyslipidemia, CKD presented to the emergency department with chest pain and shortness of breath symptoms.  She has had a difficult time for the past several months with an unexplained iron deficiency anemia.  She has been seen by hematology and has received an iron infusion and was due to have another iron infusion later in the week.  She is due to establish care with Dr. Delton Coombes 09/17/2021.  She reports unexplained weight loss. She has been dealing with a chronic dry mostly nonproductive cough for the past several weeks.  She saw her PCP and they felt that it could be an underlying pneumonia versus recurrent pulmonary edema.  She was treated with a short course of antibiotics recently.  She had no significant improvement or change from treatment.  She reports that she has had increasing malaise and generalized weakness now requiring assistance to get OOB.  She reports having to sit up in a recliner for the past several weeks to sleep due to orthopnea symptoms.  She has been taking her oral Lasix and Entresto regularly.  Of note she reports that she has not had a colonoscopy or upper endoscopy screening done. ? ?Pt was seen in ED and her CXR was concerning for a lung mass.  She was sent for CT chest. It showed bulky bilateral hilar, infrahilar, subcarinal, paratracheal adenopathy in the chest compatible with malignancy. Possibilities could include lymphoproliferative disease or lung cancer.  Dr. Delton Coombes was consulted and said he would see patient in hospital.  CT also showed findings that suggest underlying lung infection and edema.  She was started on IV antibiotics for pneumonia and admission was requested for further management.    ? ?09/12/21:  pt had some sob this am.  I personally obtained VS:  T98.1 (oral)--114-22-161/76--91-92% on 4L.  Slept well overnight.  Denies f/,c cp, n/v/d. Hemoptysis.  Complains of trouble bring up sputum. ? ?09/13/21:  pt sleeping upon my eval.  States overall sob is improving.  CXR shows bilateral pleural effusions.  Stable at 4L.  Serum creatinine continues to improve ? ?09/14/21:  more sob;  up to 6L.  Give Lasix 40 IV x 1 in light of 09/12/21 CXR findings ? ?09/15/21  continues to have sob on 6L.  Repeat CXR shows vascular congestion and LL consolidations.  Increase lasix to bid.  Palliative discussed with family>>DNR.  Plan d/c home with hospice 4/12 if relatively stable. ?

## 2021-09-08 NOTE — Assessment & Plan Note (Addendum)
--  Pt has been well managed medically with coreg, entresto, lasix ?--04/06/21  Echo with EF 55-60% with grade 1 DD and replaced aortic valve (October 2022) ?--pulm recommends d/c entresto ?

## 2021-09-08 NOTE — Assessment & Plan Note (Addendum)
--  bioprosthetic valve 2013 ?--04/06/21 Echo EF 55-60%, normal valve function ?

## 2021-09-08 NOTE — Assessment & Plan Note (Signed)
--  not able to sleep, not able to lie recumbent  ?--sleep aid ordered as needed ? ?

## 2021-09-08 NOTE — H&P (Signed)
?History and Physical  ?[place] ? ?Kathryn Morgan [id number] DOB: [birth year] DOA: 09/05/2021 ? ?PCP: Pablo Lawrence, NP  ?Patient coming from: Home  ?Level of care: Med-Surg ? ?I have personally briefly reviewed patient's old medical records in [place] ? ?Chief Complaint: chest pain  ? ?HPI: Kathryn Morgan is a 81 year old female former smoker with oxygen dependent COPD, secondary cardiomyopathy, history of aortic valve replacement, type 2 diabetes mellitus, history of necrotizing pancreatitis, status post partial pancreatectomy, osteoporosis, rheumatic fever, GERD, dyslipidemia, CKD presented to the emergency department with chest pain and shortness of breath symptoms.  She has had a difficult time for the past several months with an unexplained iron deficiency anemia.  She has been seen by hematology and has received an iron infusion and was due to have another iron infusion later in the week.  She is due to establish care with Dr. Delton Coombes earlier today.  She reports unexplained weight loss.  She also reports having a lot of GI symptoms in the last 2 weeks with increased flatulence and irregular bowel habits.  She has been dealing with a chronic dry mostly nonproductive cough for the past several weeks.  She saw her PCP and they felt that it could be an underlying pneumonia versus recurrent pulmonary edema.  She was treated with a short course of antibiotics.  She had no significant improvement or change from treatment.  She reports that she has had increasing malaise and weakness and not able to sleep at night due to difficulty breathing.  She reports having to sit up in a recliner for the past several weeks.  She reports peripheral edema.  She has been taking her oral Lasix and Entresto regularly.  She was seen by her nephrologist Dr. Theador Hawthorne and being worked up for this as well.  Of note she reports that she has not had a colonoscopy or upper endoscopy screening done. ? ?Pt was seen  in ED and her CXR was concerning for a lung mass.  She was sent for CT chest. It showed bulky bilateral hilar, infrahilar, subcarinal, paratracheal adenopathy in the chest compatible with malignancy. Possibilities could include lymphoproliferative disease or lung cancer.  Dr. Delton Coombes was consulted and said he would see patient in hospital.  CT also showed findings that suggest underlying lung infection and edema.  She was started on IV antibiotics for pneumonia and admission was requested for further management.   ? ?Review of Systems: Review of Systems  ?Constitutional:  Positive for malaise/fatigue and weight loss. Negative for diaphoresis and fever.  ?HENT: Negative.    ?Eyes: Negative.   ?Respiratory:  Positive for cough, sputum production, shortness of breath and wheezing. Negative for hemoptysis.   ?Cardiovascular:  Positive for leg swelling. Negative for chest pain and palpitations.  ?Gastrointestinal:  Negative for abdominal pain, blood in stool and heartburn.  ?Genitourinary: Negative.   ?Musculoskeletal: Negative.   ?Skin: Negative.   ?Neurological:  Positive for weakness.  ?Endo/Heme/Allergies: Negative.   ?Psychiatric/Behavioral:  Positive for depression.   ?All other systems reviewed and are negative. ?  ?Past Medical History:  ?Diagnosis Date  ? Aortic stenosis   ? Status post 86mm [facility] Ease pericardial tissue valve  ? Asthma   ? Chronic kidney disease   ? COPD (chronic obstructive pulmonary disease) ([place])   ? Depression   ? Dyslipidemia   ? Essential hypertension   ? Gallstone pancreatitis   ? 2005  ? GERD (gastroesophageal reflux disease)   ?  History of cardiomyopathy   ? Necrotizing pancreatitis   ? Neuropathy   ? Osteoporosis   ? Rhabdomyolysis   ? Rheumatic fever   ? Type 2 diabetes mellitus ([place])   ? Insulin pump  ? ? ?Past Surgical History:  ?Procedure Laterality Date  ? AORTIC VALVE REPLACEMENT  08/27/2011  ? Procedure: AORTIC VALVE REPLACEMENT (AVR);  Surgeon: Rexene Alberts, MD;   Location: [facility];  Service: Open Heart Surgery;  Laterality: N/A;  ? CHOLECYSTECTOMY    ? Debridement pancreas    ? ESOPHAGOGASTRODUODENOSCOPY  04/21/04  ? Normal esophagus/normal D1,D2; attempted  transgastric drainage/stent placement of pancreatic pseudocyst, no obvious location for gastrotomy, therefore transferred to [place] N/A 06/03/2011  ? Procedure: LEFT AND RIGHT HEART CATHETERIZATION WITH CORONARY ANGIOGRAM;  Surgeon: Burnell Blanks, MD;  Location[facility] C[facility];  Service: Cardiovascular;  Laterality: N/A;  Right Heart Cath also  ? PANCREATIC PSEUDOCYST DRAINAGE    ? ? ? reports that she quit smoking about 40 years ago. Her smoking use included cigarettes. She has a 45.00 pack-year smoking history. She has never used smokeless tobacco. She reports that she does not drink alcohol and does not use drugs. ? ?Allergies  ?Allergen Reactions  ? Amlodipine Shortness Of Breath  ?  Swelling - pcp stopped   ? Codeine Itching  ? Promethazine Hcl Anxiety and Other (See Comments)  ?  Pt and husband state severe hallucinations and paranoia from [place]  ? Ace Inhibitors Cough  ? Adhesive [Tape] Other (See Comments)  ?  Skin tearing  ? Hydralazine   ?  States weakness and oxygen level dropped.  She stopped the medication  ? Levofloxacin Itching and Other (See Comments)  ?  Patient unsure of exact reaction  ? ? ?Family History  ?Problem Relation Age of Onset  ? Heart attack Father   ? Heart disease Other   ?     CHF  ? Colon cancer Neg Hx   ? Anesthesia problems Neg Hx   ? ? ?Prior to Admission medications   ?Medication Sig Start Date End Date Taking? Authorizing Provider  ?albuterol (PROVENTIL) (2.5 MG/3ML) 0.083% nebulizer solution Take 2.5 mg by nebulization as needed for wheezing or shortness of breath.   Yes [provider]  ?aspirin EC 81 MG tablet Take 81 mg by mouth daily.   Yes [provider]  ?b complex vitamins capsule  Take 1 capsule by mouth daily.   Yes [provider]  ?carvedilol (COREG) 12.5 MG tablet Take 6.25 mg by mouth 2 (two) times daily.   Yes [provider]  ?Clobetasol Prop Crea-Coal Tar (CLOBETA CREAM EX) Apply 1 application. topically in the morning and at bedtime.   Yes [provider]  ?febuxostat (ULORIC) 40 MG tablet Take 1 tablet by mouth daily. 12/24/19  Yes [provider]  ?furosemide (LASIX) 40 MG tablet Take 60 mg by mouth daily.   Yes [provider]  ?Glycopyrrolate-Formoterol 9-4.8 MCG/ACT AERO Inhale 2 puffs into the lungs 2 (two) times daily.  03/15/16  Yes [provider]  ?Insulin Lispro (HUMALOG KWIKPEN [place]) Inject 6-8 Units into the skin as needed.   Yes [provider]  ?metFORMIN (GLUCOPHAGE) 1000 MG tablet Take 1,000 mg by mouth daily with breakfast. 02/05/15  Yes [provider]  ?sacubitril-valsartan (ENTRESTO) 97-103 MG Take 1 tablet by mouth 2 (two) times daily.   Yes [provider]  ?  simvastatin (ZOCOR) 20 MG tablet Take 20 mg by mouth daily.   Yes [provider]  ?TOUJEO SOLOSTAR 300 UNIT/ML SOPN Inject 28 Units into the skin daily. 03/12/15  Yes [provider]  ?albuterol (PROVENTIL HFA;VENTOLIN HFA) 108 (90 BASE) MCG/ACT inhaler Inhale 2 puffs into the lungs every 6 (six) hours as needed for wheezing or shortness of breath.    [provider]  ? ? ?Physical Exam: ?Vitals:  ? 09/26/2021 1200 09/07/2021 1230 09/05/2021 1300 09/30/2021 1330  ?BP: 136/67 (!) 123/57 (!) 152/71 140/64  ?Pulse: 97 92 (!) 106 (!) 106  ?Resp: (!) 24 (!) 26 (!) 22 (!) 25  ?Temp:      ?TempSrc:      ?SpO2: 96% 98% 97% 98%  ?Height:      ? ? ?Constitutional: appears very pale and weak, awake, alert, lying supine, NAD, calm, comfortable ?Eyes: PERRL, lids and conjunctivae normal ?ENMT: Mucous membranes are moist. Posterior pharynx clear of any exudate or lesions.  ?Neck: normal, supple, no masses, no  thyromegaly ?Respiratory: rales heard RLL,  rare expiratory wheezing, rare bibasilar crackles. Normal respiratory effort. No accessory muscle use.  ?Cardiovascular: normal s1, s2 sounds, no murmurs / rubs / gallops. No ex

## 2021-09-08 NOTE — Assessment & Plan Note (Signed)
--  from pneumonia, treating with antibiotics ?--recheck CBC in AM ?

## 2021-09-08 NOTE — Assessment & Plan Note (Signed)
--  continue ceftriaxone and azithromycin  ?--continue supportive measures  ?

## 2021-09-08 NOTE — Assessment & Plan Note (Signed)
--  resume lasix 60 mg daily  ?--low sodium diet  ?

## 2021-09-08 NOTE — Assessment & Plan Note (Addendum)
--   reduced dose semglee ?-- novolog sliding scale ?4/4 A1C--7.7 ?CBGs elevated due to steroids ?

## 2021-09-08 NOTE — Assessment & Plan Note (Signed)
stable °

## 2021-09-08 NOTE — ED Triage Notes (Signed)
Pt in from home via [facility], per report the pt is being seen by PCP for r/o PNA, pt hx of CHF, pt has bil lower edema, pt takes 80 mg Lasix daily, pt on 3L , pt A&O x4 ?

## 2021-09-08 NOTE — Assessment & Plan Note (Addendum)
--   pt at baseline is on 3L/min supplemental oxygen at home ?-- currently on 6L ?-- multifactorial including COPD, PNA and pleural effusions/fluid overload ?-- CT chest--bulky hilar LN, bilateral LL consolidation with question RLL mass; sm bilateral pleural effusions ?-check D-dimer 1.92 ?4/5 CTA chest--no PE; bulky mediastinal LN, bilateral LL consolidation, 5 cm LLL mass; lytic lesion to R-6th rib ?-add Yupelri and pulmicort ?-hypertonic saline if still difficulty bringing up sputum ?4/8--repeat CXR--personally reviewed--bilateral pleural effusions; vascular congestion ?09/14/21--started lasix IV ?09/15/21--repeat CXR, increase lasix to 40 mg IV bid ?

## 2021-09-08 NOTE — ED Provider Notes (Signed)
?[place] EMERGENCY DEPARTMENT ?Provider Note ? ? ?CSN: [id number] ?Arrival date & time: 09/11/2021  0846 ? ?  ? ?History ? ?Chief Complaint  ?Patient presents with  ? Chest Pain  ? ? ?Kathryn Morgan is a 81 y.o. female. ? ?Patient complains of chest pain and shortness of breath.  Patient has a history of COPD and diabetes.  She has had a cough for a couple weeks to that was treated initially with ? ?The history is provided by the patient and medical records. No language interpreter was used.  ?Chest Pain ?Pain location:  L chest ?Pain quality: aching   ?Pain radiates to:  Does not radiate ?Pain severity:  Moderate ?Onset quality:  Sudden ?Timing:  Constant ?Progression:  Waxing and waning ?Chronicity:  New ?Context: breathing   ?Relieved by:  Nothing ?Worsened by:  Nothing ?Ineffective treatments:  None tried ?Associated symptoms: shortness of breath   ?Associated symptoms: no abdominal pain, no back pain, no cough, no fatigue and no headache   ? ?  ? ?Home Medications ?Prior to Admission medications   ?Medication Sig Start Date End Date Taking? Authorizing Provider  ?albuterol (PROVENTIL) (2.5 MG/3ML) 0.083% nebulizer solution Take 2.5 mg by nebulization as needed for wheezing or shortness of breath.   Yes [provider]  ?aspirin EC 81 MG tablet Take 81 mg by mouth daily.   Yes [provider]  ?b complex vitamins capsule Take 1 capsule by mouth daily.   Yes [provider]  ?carvedilol (COREG) 12.5 MG tablet Take 6.25 mg by mouth 2 (two) times daily.   Yes [provider]  ?Clobetasol Prop Crea-Coal Tar (CLOBETA CREAM EX) Apply 1 application. topically in the morning and at bedtime.   Yes [provider]  ?febuxostat (ULORIC) 40 MG tablet Take 1 tablet by mouth daily. 12/24/19  Yes [provider]  ?furosemide (LASIX) 40 MG tablet Take 60 mg by mouth daily.   Yes [provider]  ?Glycopyrrolate-Formoterol 9-4.8 MCG/ACT AERO Inhale 2 puffs into the  lungs 2 (two) times daily.  03/15/16  Yes [provider]  ?Insulin Lispro (HUMALOG KWIKPEN [place]) Inject 6-8 Units into the skin as needed.   Yes [provider]  ?metFORMIN (GLUCOPHAGE) 1000 MG tablet Take 1,000 mg by mouth daily with breakfast. 02/05/15  Yes [provider]  ?sacubitril-valsartan (ENTRESTO) 97-103 MG Take 1 tablet by mouth 2 (two) times daily.   Yes [provider]  ?simvastatin (ZOCOR) 20 MG tablet Take 20 mg by mouth daily.   Yes [provider]  ?TOUJEO SOLOSTAR 300 UNIT/ML SOPN Inject 28 Units into the skin daily. 03/12/15  Yes [provider]  ?albuterol (PROVENTIL HFA;VENTOLIN HFA) 108 (90 BASE) MCG/ACT inhaler Inhale 2 puffs into the lungs every 6 (six) hours as needed for wheezing or shortness of breath.    [provider]  ?amoxicillin-clavulanate (AUGMENTIN) 875-125 MG tablet Take 1 tablet by mouth 2 (two) times daily. ?Patient not taking: Reported on 09/11/21 08/28/21   [provider]  ?   ? ?Allergies    ?Amlodipine, Codeine, Promethazine hcl, Ace inhibitors, Adhesive [tape], Hydralazine, and Levofloxacin   ? ?Review of Systems   ?Review of Systems  ?Constitutional:  Negative for appetite change and fatigue.  ?HENT:  Negative for congestion, ear discharge and sinus pressure.   ?Eyes:  Negative for discharge.  ?Respiratory:  Positive for shortness of breath. Negative for cough.   ?Cardiovascular:  Positive for chest pain.  ?Gastrointestinal:  Negative for abdominal pain and diarrhea.  ?Genitourinary:  Negative for frequency and hematuria.  ?Musculoskeletal:  Negative for back pain.  ?Skin:  Negative for rash.  ?Neurological:  Negative for seizures and headaches.  ?Psychiatric/Behavioral:  Negative for hallucinations.   ? ?Physical Exam ?Updated Vital Signs ?BP 140/64   Pulse (!) 106   Temp 97.8 ?F (36.6 ?C) (Oral)   Resp (!) 25   Ht 5\' 3"  (1.6 m)   SpO2 98%   BMI 31.99 kg/m?  ?Physical Exam ?Vitals and nursing note  reviewed.  ?Constitutional:   ?   Appearance: She is well-developed.  ?HENT:  ?   Head: Normocephalic.  ?   Mouth/Throat:  ?   Mouth: Mucous membranes are moist.  ?Eyes:  ?   General: No scleral icterus. ?   Conjunctiva/sclera: Conjunctivae normal.  ?Neck:  ?   Thyroid: No thyromegaly.  ?Cardiovascular:  ?   Rate and Rhythm: Normal rate and regular rhythm.  ?   Heart sounds: No murmur heard. ?  No friction rub. No gallop.  ?Pulmonary:  ?   Breath sounds: No stridor. No wheezing or rales.  ?Chest:  ?   Chest wall: No tenderness.  ?Abdominal:  ?   General: There is no distension.  ?   Tenderness: There is no abdominal tenderness. There is no rebound.  ?Musculoskeletal:     ?   General: Normal range of motion.  ?   Cervical back: Neck supple.  ?Lymphadenopathy:  ?   Cervical: No cervical adenopathy.  ?Skin: ?   Findings: No erythema or rash.  ?Neurological:  ?   Mental Status: She is alert and oriented to person, place, and time.  ?   Motor: No abnormal muscle tone.  ?   Coordination: Coordination normal.  ?Psychiatric:     ?   Behavior: Behavior normal.  ? ? ?ED Results / Procedures / Treatments   ?Labs ?(all labs ordered are listed, but only abnormal results are displayed) ?Labs Reviewed  ?BASIC METABOLIC PANEL - Abnormal; Notable for the following components:  ?    Result Value  ? Sodium 129 (*)   ? Chloride 85 (*)   ? CO2 34 (*)   ? Glucose, Bld 208 (*)   ? BUN 29 (*)   ? All other components within normal limits  ?CBC - Abnormal; Notable for the following components:  ? WBC 12.4 (*)   ? Hemoglobin 10.7 (*)   ? HCT 34.3 (*)   ? MCH 25.7 (*)   ? RDW 15.6 (*)   ? All other components within normal limits  ?BRAIN NATRIURETIC PEPTIDE - Abnormal; Notable for the following components:  ? B Natriuretic Peptide 180.0 (*)   ? All other components within normal limits  ?HEPATIC FUNCTION PANEL - Abnormal; Notable for the following components:  ? Albumin 3.0 (*)   ? All other components within normal limits  ?TROPONIN I (HIGH  SENSITIVITY)  ?TROPONIN I (HIGH SENSITIVITY)  ? ? ?EKG ?None ? ?Radiology ?CT Chest W Contrast ? ?Result Date: 2021-09-17 ?CLINICAL DATA:  Chest pain, congestive heart failure possible pneumonia. * Tracking Code: BO * EXAM: CT CHEST WITH CONTRAST TECHNIQUE: Multidetector CT imaging of the chest was performed during intravenous contrast administration. RADIATION DOSE REDUCTION: This exam was performed according to the departmental dose-optimization program which includes automated exposure control, adjustment of the mA and/or kV according to patient size and/or use of iterative reconstruction technique. CONTRAST:  71mL OMNIPAQUE IOHEXOL 300 MG/ML  SOLN COMPARISON:  09/30/2021 radiographs and prior CT chest from 07/31/2012 FINDINGS: Cardiovascular: Heterogeneity in some of the pulmonary arterial branches, example the left upper lobe pulmonary arterial branch on image 44 series 2. This could be incidental but pulmonary embolus is not excluded on today's non-angiogram CT. Mild cardiomegaly. Aortic valve prosthesis. Coronary, aortic arch, and branch vessel atherosclerotic vascular disease. Mediastinum/Nodes: Pathologic right hilar adenopathy, 2.4 cm in short axis on image 57 series 2. Pathologic subcarinal adenopathy, 3.0 cm in short axis on image 580 [place] 2. Pathologic right infrahilar adenopathy at about 1.7 cm in short axis on image 72 series 2. Pathologic left hilar adenopathy, 2.3 cm in short axis on image 63 series 2. Right paratracheal node mildly prominent at 1.1 cm in short axis on image 38 series 2. A lymph node anterior to the carina measures 1.8 cm in short axis on image 9 series 2. Suspected left infrahilar adenopathy. Conglomerate adenopathy surrounds the carina, right mainstem bronchus, the right upper lobe bronchus and the bronchus intermedius. Lungs/Pleura: Substantial consolidation of the vast majority of the lower lobes. A lower lobe mask can thus not be excluded on either side although I do not see  definite central airway occlusion. In particular there is some hypodensity in the superior segment right lower lobe which could be cavitary pneumonia or possibly a centrally necrotic mass. Small bilateral pleur

## 2021-09-08 NOTE — Assessment & Plan Note (Addendum)
--  resume home bronchodilators (or hospital substitutions) ?--added Yulperi, pulmicort, xopenex ? ?

## 2021-09-08 NOTE — Assessment & Plan Note (Addendum)
--  resumed coreg ?

## 2021-09-08 NOTE — Assessment & Plan Note (Addendum)
--  Pt has been receiving IV iron infusions and Hg up to 10.  ?-- reports never had colon cancer screening  ?-- Dr. Delton Coombes consult appreciated>>no infusion needed at this time ?--iron sat 16%, ferritin 637 ?

## 2021-09-09 ENCOUNTER — Inpatient Hospital Stay (HOSPITAL_COMMUNITY): Payer: Medicare Other

## 2021-09-09 ENCOUNTER — Encounter (HOSPITAL_COMMUNITY): Payer: Self-pay | Admitting: Family Medicine

## 2021-09-09 DIAGNOSIS — Z7189 Other specified counseling: Secondary | ICD-10-CM | POA: Diagnosis not present

## 2021-09-09 DIAGNOSIS — J438 Other emphysema: Secondary | ICD-10-CM

## 2021-09-09 DIAGNOSIS — R634 Abnormal weight loss: Secondary | ICD-10-CM | POA: Diagnosis not present

## 2021-09-09 DIAGNOSIS — R590 Localized enlarged lymph nodes: Secondary | ICD-10-CM | POA: Diagnosis not present

## 2021-09-09 DIAGNOSIS — I429 Cardiomyopathy, unspecified: Secondary | ICD-10-CM

## 2021-09-09 DIAGNOSIS — Z515 Encounter for palliative care: Secondary | ICD-10-CM

## 2021-09-09 DIAGNOSIS — J9621 Acute and chronic respiratory failure with hypoxia: Secondary | ICD-10-CM

## 2021-09-09 DIAGNOSIS — J439 Emphysema, unspecified: Secondary | ICD-10-CM

## 2021-09-09 DIAGNOSIS — D509 Iron deficiency anemia, unspecified: Secondary | ICD-10-CM | POA: Diagnosis not present

## 2021-09-09 DIAGNOSIS — I10 Essential (primary) hypertension: Secondary | ICD-10-CM

## 2021-09-09 DIAGNOSIS — J181 Lobar pneumonia, unspecified organism: Secondary | ICD-10-CM

## 2021-09-09 LAB — GLUCOSE, CAPILLARY
Glucose-Capillary: 205 mg/dL — ABNORMAL HIGH (ref 70–99)
Glucose-Capillary: 141 mg/dL — ABNORMAL HIGH (ref 70–99)
Glucose-Capillary: 178 mg/dL — ABNORMAL HIGH (ref 70–99)
Glucose-Capillary: 161 mg/dL — ABNORMAL HIGH (ref 70–99)
Glucose-Capillary: 126 mg/dL — ABNORMAL HIGH (ref 70–99)

## 2021-09-09 LAB — COMPREHENSIVE METABOLIC PANEL
ALT: 13 U/L (ref 0–44)
AST: 14 U/L — ABNORMAL LOW (ref 15–41)
Albumin: 2.6 g/dL — ABNORMAL LOW (ref 3.5–5.0)
Alkaline Phosphatase: 62 U/L (ref 38–126)
Anion gap: 9 (ref 5–15)
BUN: 28 mg/dL — ABNORMAL HIGH (ref 8–23)
CO2: 39 mmol/L — ABNORMAL HIGH (ref 22–32)
Calcium: 10.2 mg/dL (ref 8.9–10.3)
Chloride: 85 mmol/L — ABNORMAL LOW (ref 98–111)
Creatinine, Ser: 0.88 mg/dL (ref 0.44–1.00)
GFR, Estimated: >60 mL/min (ref >60)
Glucose, Bld: 183 mg/dL — ABNORMAL HIGH (ref 70–99)
Potassium: 4.9 mmol/L (ref 3.5–5.1)
Sodium: 133 mmol/L — ABNORMAL LOW (ref 135–145)
Total Bilirubin: 0.4 mg/dL (ref 0.3–1.2)
Total Protein: 6.5 g/dL (ref 6.5–8.1)

## 2021-09-09 LAB — CBC WITH DIFFERENTIAL/PLATELET
Abs Immature Granulocytes: 0.2 K/uL — ABNORMAL HIGH (ref 0.00–0.07)
Basophils Absolute: 0 K/uL (ref 0.0–0.1)
Basophils Relative: 0 %
Eosinophils Absolute: 0 K/uL (ref 0.0–0.5)
Eosinophils Relative: 0 %
HCT: 33.3 % — ABNORMAL LOW (ref 36.0–46.0)
Hemoglobin: 9.8 g/dL — ABNORMAL LOW (ref 12.0–15.0)
Immature Granulocytes: 2 %
Lymphocytes Relative: 9 %
Lymphs Abs: 1.1 K/uL (ref 0.7–4.0)
MCH: 25.4 pg — ABNORMAL LOW (ref 26.0–34.0)
MCHC: 29.4 g/dL — ABNORMAL LOW (ref 30.0–36.0)
MCV: 86.3 fL (ref 80.0–100.0)
Monocytes Absolute: 1 K/uL (ref 0.1–1.0)
Monocytes Relative: 8 %
Neutro Abs: 10.5 K/uL — ABNORMAL HIGH (ref 1.7–7.7)
Neutrophils Relative %: 81 %
Platelets: 378 K/uL (ref 150–400)
RBC: 3.86 MIL/uL — ABNORMAL LOW (ref 3.87–5.11)
RDW: 15.6 % — ABNORMAL HIGH (ref 11.5–15.5)
WBC: 12.9 K/uL — ABNORMAL HIGH (ref 4.0–10.5)
nRBC: 0 % (ref 0.0–0.2)

## 2021-09-09 LAB — IRON AND TIBC
Iron: 43 ug/dL (ref 28–170)
Saturation Ratios: 16 % (ref 10.4–31.8)
TIBC: 261 ug/dL (ref 250–450)
UIBC: 218 ug/dL

## 2021-09-09 LAB — PROCALCITONIN: Procalcitonin: 0.81 ng/mL

## 2021-09-09 LAB — MAGNESIUM: Magnesium: 1.6 mg/dL — ABNORMAL LOW (ref 1.7–2.4)

## 2021-09-09 LAB — FERRITIN: Ferritin: 637 ng/mL — ABNORMAL HIGH (ref 11–307)

## 2021-09-09 LAB — HEMOGLOBIN A1C
Hgb A1c MFr Bld: 7.7 % — ABNORMAL HIGH (ref 4.8–5.6)
Mean Plasma Glucose: 174 mg/dL

## 2021-09-09 LAB — D-DIMER, QUANTITATIVE: D-Dimer, Quant: 1.92 ug/mL-FEU — ABNORMAL HIGH (ref 0.00–0.50)

## 2021-09-09 MED ORDER — PANTOPRAZOLE SODIUM 40 MG PO TBEC
40.0000 mg | DELAYED_RELEASE_TABLET | Freq: Two times a day (BID) | ORAL | Status: DC
  Administered 2021-09-09 – 2021-09-15 (×12): 40 mg via ORAL
  Filled 2021-09-09 (×12): qty 1

## 2021-09-09 MED ORDER — METHYLPREDNISOLONE SODIUM SUCC 125 MG IJ SOLR
80.0000 mg | Freq: Two times a day (BID) | INTRAMUSCULAR | Status: DC
Start: 2021-09-09 — End: 2021-09-13
  Administered 2021-09-09 – 2021-09-13 (×9): 80 mg via INTRAVENOUS
  Filled 2021-09-09 (×9): qty 2

## 2021-09-09 MED ORDER — GADOBUTROL 1 MMOL/ML IV SOLN
8.0000 mL | Freq: Once | INTRAVENOUS | Status: AC | PRN
  Administered 2021-09-09: 8 mL via INTRAVENOUS

## 2021-09-09 MED ORDER — DM-GUAIFENESIN ER 30-600 MG PO TB12
2.0000 | ORAL_TABLET | Freq: Two times a day (BID) | ORAL | Status: DC
  Administered 2021-09-09 – 2021-09-15 (×13): 2 via ORAL
  Filled 2021-09-09 (×2): qty 2
  Filled 2021-09-09: qty 1
  Filled 2021-09-09 (×4): qty 2
  Filled 2021-09-09: qty 1
  Filled 2021-09-09 (×3): qty 2
  Filled 2021-09-09: qty 1
  Filled 2021-09-09: qty 2

## 2021-09-09 MED ORDER — CARVEDILOL 3.125 MG PO TABS
3.1250 mg | ORAL_TABLET | Freq: Two times a day (BID) | ORAL | Status: DC
  Administered 2021-09-09 – 2021-09-11 (×6): 3.125 mg via ORAL
  Filled 2021-09-09 (×5): qty 1

## 2021-09-09 MED ORDER — ENSURE MAX PROTEIN PO LIQD
11.0000 [oz_av] | Freq: Two times a day (BID) | ORAL | Status: DC
  Administered 2021-09-09 – 2021-09-14 (×4): 11 [oz_av] via ORAL

## 2021-09-09 MED ORDER — IOHEXOL 350 MG/ML SOLN
75.0000 mL | Freq: Once | INTRAVENOUS | Status: AC | PRN
  Administered 2021-09-09: 75 mL via INTRAVENOUS

## 2021-09-09 MED ORDER — IRBESARTAN 150 MG PO TABS
75.0000 mg | ORAL_TABLET | Freq: Two times a day (BID) | ORAL | Status: DC
  Administered 2021-09-09 – 2021-09-10 (×3): 75 mg via ORAL
  Filled 2021-09-09 (×3): qty 1

## 2021-09-09 MED ORDER — MAGNESIUM SULFATE 2 GM/50ML IV SOLN
2.0000 g | Freq: Once | INTRAVENOUS | Status: AC
  Administered 2021-09-09: 2 g via INTRAVENOUS
  Filled 2021-09-09: qty 50

## 2021-09-09 NOTE — Consult Note (Signed)
? ?NAME:  Kathryn Morgan, MRN:  [id number], DOB:  [birth year], LOS: 1 ?ADMISSION DATE:  09/15/2021, CONSULTATION DATE:  09/09/21 ?REFERRING MD:  Tat/ Triad , CHIEF COMPLAINT:  ? aecopd/ lung mass  ? 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 And chronic dry mostly nonproductive cough for the x severeal weeks.  She saw her PCP and they felt that it could be an underlying pneumonia versus recurrent pulmonary edema.  She was treated with a short course of antibiotics.  She had no significant improvement or change from treatment and then noted had to sit up in a recliner  to sleep assoc with peripheral edema.  She had been taking her oral Lasix and Entresto regularly.    ? ?Baseline still able to push a cart around a grocery store 4 weeks PTA s using protable 02 and now sob across the room even on 02   ? ?Appetite very poor x 4 weeks PTA but no dysphagia or cp of any kind.  ? ?  ? ?Significant Hospital Events: ?Including procedures, antibiotic start and stop dates in addition to other pertinent events   ?Ct with contrast 4/4 1. Bulky bilateral hilar, infrahilar, subcarinal, paratracheal adenopathy in the chest compatible with malignancy. Possibilities could include lymphoproliferative disease or lung cancer. Sarcoidosis is a less likely differential diagnostic consideration. Consolidation of most of both lower lobes. Some of this may be a ?drowned lung appearance or pneumonia, lower lobe mass is not ?excluded particularly on the right side where there is some ?hypodensity in the superior segment which could reflect a centrally ?necrotic  mass. ?Hypodense hepatic and splenic lesions may reflect malignancy ? ?Interim History / Subjective:  ?No resting sob at 45 degrees hob/  ? ?Objective   ?Blood pressure (!) 119/52, pulse 89, temperature 97.7 ?F (36.5 ?C), resp. rate 18, height 5\' 3"  (1.6 m), SpO2 98 %. ?   ?   ? ?Intake/Output Summary (Last 24 hours) at 09/09/2021 1028 ?Last data filed at 09/09/2021 0500 ?Gross per 24 hour  ?Intake 440 ml  ?Output --  ?Net 440 ml  ? ?There were no vitals filed for this visit. ? ?Tmax:  97.7 ?General appearance:    chronically ill appearing pale wf nad   ?At Rest 02 sats  98% on 4lpm  ?No jvd ?Oropharynx clear,  mucosa nl ?Neck supple ?Lungs with a few scattered exp > insp rhonchi bilaterally ?RRR no s3 or or sign murmur ?Abd obese with limited  excursion  ?Extr warm with no edema or clubbing noted ?Neuro  Sensorium intact,  no apparent motor deficits  ? ?  ? ?Assessment & Plan:  ?1)  Most likely Stage IV (or at least IIIB) lung ca in remote smoker with ? Post obst Pna RLL  ?>>> needs fob with EBUS  but would do outpt PET 1st as may have easier tissue dx elsewhere ?  ? ?2) GOLD 3 COPD spirometry in 2013 s/p smoking cessation around 1980 and now ? Group D > rx triple and since cough is the worst of her problems would avoid DPI in favor of HFA = Breztri or symbicort/spiriva ? ? ?3) CHF on entresto ?-  last echo 0000000 showed diastolic dysfunction  G1 not systolic chf  ?- The sacubitrilat component of entresto is not and ACEi but it does lead to higher levels of circulating bradykinin (the culprit in ACEi related cough) because it reduces Neprilysin based clearance of bradykinin. ?The typical symptoms are dry daytime cough (9% per PI) or complaints of a new sensation of globus or excess PNDS.  ?>>> need to change to just valsartan until we sort out all her resp needs ? ? 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 ? ?Best Practice  (right click and "Reselect all SmartList Selections" daily)  ? ? Per Triad  ? ?Labs   ?CBC: ?Recent Labs  ?Lab 09/15/2021 ?0908 09/09/21 ?[id number]  ?WBC 12.4* 12.9*  ?NEUTROABS  --  10.5*  ?HGB 10.7* 9.8*  ?HCT 34.3* 33.3*  ?MCV 82.5 86.3  ?PLT 396 378  ? ? ?Basic Metabolic Panel: ?Recent Labs  ?Lab 10/03/2021 ?0908 09/09/21 ?[id number]  ?NA 129* 133*  ?K 4.8 4.9  ?CL 85* 85*  ?CO2 34* 39*  ?GLUCOSE 208* 183*  ?BUN 29* 28*  ?CREATININE 0.59 0.88  ?CALCIUM 10.3 10.2  ?MG  --  1.6*  ? ?GFR: ?CrCl cannot be calculated (Unknown ideal weight.). ?Recent Labs  ?Lab 09/15/2021 ?0908 09/09/21 ?[id number] 09/09/21 ?[id number]  ?PROCALCITON  --   --  0.81  ?WBC 12.4* 12.9*  --   ? ? ?Liver Function Tests: ?Recent Labs  ?Lab 09/14/2021 ?0908 09/09/21 ?[id number]  ?AST 17 14*  ?ALT 18 13  ?ALKPHOS 70 62  ?BILITOT 0.5 0.4  ?PROT 7.1 6.5  ?ALBUMIN 3.0* 2.6*  ? ?No results for input(s): LIPASE, AMYLASE in the last 168 hours. ?No results for input(s): AMMONIA in the last 168 hours. ? ?ABG ?   ?Component Value Date/Time  ? PHART 7.393 08/27/2011 1639  ? PCO2ART 45.0 08/27/2011 1639  ? PO2ART 146.0 (H) 08/27/2011 1639  ? HCO3 27.6 (H) 08/27/2011 1639  ? TCO2 27 01/07/2012 0646  ? ACIDBASEDEF 3.5 (H) 06/04/2008 0914  ? O2SAT 99.0 08/27/2011 1639  ?  ? ?Coagulation Profile: ?No results for input(s): INR, PROTIME in the last 168 hours. ? ?Cardiac Enzymes: ?No results for input(s): CKTOTAL, CKMB, CKMBINDEX, TROPONINI in the last 168 hours. ? ?HbA1C: ?Hgb A1c MFr Bld  ?Date/Time Value Ref Range Status  ?09/15/2021 09:08 AM 7.7 (H) 4.8 - 5.6 % Final  ?  Comment:  ?  (NOTE) ?        Prediabetes: 5.7 - 6.4 ?        Diabetes: >6.4 ?        Glycemic control for adults with diabetes: <7.0 ?  ?11/25/2014 05:49 AM 7.0 (H) 4.8 - 5.6 % Final  ?  Comment:  ?  (NOTE) ?        Pre-diabetes: 5.7 - 6.4 ?        Diabetes: >6.4 ?        Glycemic control for adults with diabetes: <7.0 ?  ? ? ?CBG: ?Recent Labs  ?Lab 09/06/2021 ?1637 09/18/2021 ?2204 09/09/21 ?0236 09/09/21 ?[id number]  ?GLUCAP 210*  223* 205* 141*  ? ? ?  ? ?Past Medical History:  ?She,  has a past medical history of Aortic stenosis, Asthma, Chronic kidney disease, COPD (chronic obstructive pulmonary disease) ([place]), Depression, Dyslipidemia, Essential hypertension, Gallstone pancreatitis, GERD (gastroesophageal reflux disease), History of  cardiomyopathy, Necrotizing pancreatitis, Neuropathy, Osteoporosis, Rhabdomyolysis, Rheumatic fever, and Type 2 diabetes mellitus ([place]).  ? ?Surgical History:  ? ?Past Surgical History:  ?Procedure Laterality Date  ? AORTIC VALVE REPLACEMENT  08/27/2011  ? Procedure: AORTIC VALVE REPLACEMENT (AVR);  Surgeon: Rexene Alberts, MD;  Location: [facility];  Service: Open Heart Surgery;  Laterality: N/A;  ? CHOLECYSTECTOMY    ? Debridement pancreas    ? ESOPHAGOGASTRODUODENOSCOPY  04/21/04  ? Normal esophagus/normal D1,D2; attempted  transgastric drainage/stent placement of pancreatic pseudocyst, no obvious location for gastrotomy, therefore transferred to [facility] N/A 06/03/2011  ? Procedure: LEFT AND RIGHT HEART CATHETERIZATION WITH CORONARY ANGIOGRAM;  Surgeon: Burnell Blanks, MD;  Location: [facility] CATH LAB;  Service: Cardiovascular;  Laterality: N/A;  Right Heart Cath also  ? PANCREATIC PSEUDOCYST DRAINAGE    ?  ? ?Social History:  ? reports that she quit smoking about 40 years ago. Her smoking use included cigarettes. She has a 45.00 pack-year smoking history. She has never used smokeless tobacco. She reports that she does not drink alcohol and does not use drugs.  ? ?Family History:  ?Her family history includes Heart attack in her father; Heart disease in an other family member. There is no history of Colon cancer or Anesthesia problems.  ? ?Allergies ?Allergies  ?Allergen Reactions  ? Amlodipine Shortness Of Breath  ?  Swelling - pcp stopped   ? Codeine Itching  ? Promethazine Hcl Anxiety and Other (See Comments)  ?  Pt and husband state severe  hallucinations and paranoia from [place]  ? Ace Inhibitors Cough  ? Adhesive [Tape] Other (See Comments)  ?  Skin tearing  ? Hydralazine   ?  States weakness and oxygen level dropped.  She stopped the m

## 2021-09-09 NOTE — Progress Notes (Signed)
Inpatient Diabetes Program Recommendations ? ?AACE/ADA: New Consensus Statement on Inpatient Glycemic Control  ? ?Target Ranges:  Prepandial:   less than 140 mg/dL ?     Peak postprandial:   less than 180 mg/dL (1-2 hours) ?     Critically ill patients:  140 - 180 mg/dL  ? ? Latest Reference Range & Units 09/09/21 02:36 09/09/21 08:13  ?Glucose-Capillary 70 - 99 mg/dL 342 (H) 876 (H)  ? ? Latest Reference Range & Units 09/09/21 16:37 2021-09-09 22:04  ?Glucose-Capillary 70 - 99 mg/dL 811 (H) 572 (H)  ? ?Review of Glycemic Control ? ?Diabetes history: DM2 ?Outpatient Diabetes medications: Toujeo 28 units daily, Humalog 6-8 units TID as needed, Metformin 1000 mg daily ?Current orders for Inpatient glycemic control: Semglee 20 units QHS, Novolog 0-15 units TID with meals, Novolog 0-5 units QHS ? ?Inpatient Diabetes Program Recommendations:   ? ?Insulin: Per chart, Semglee was NOT given last night (charted as refused) and fasting glucose 141 mg/dl today. Patient reports she took Toujeo 28 units on morning of 09-09-2021. Please consider changing Semglee to 20 units daily (to start this morning at 10am).  ? ?NOTE: Spoke with patient over the phone regarding DM control and DM medications. Patient reports that she takes Toujeo 28 units QAM, Humalog 6-8 units TID if needed (based on glucose), and Metformin 1000 mg daily. Patient reports she took Toujeo 28 units at home yesterday morning prior to coming to the hospital. Patient reports she uses Dexcom CGM but she had to remove it yesterday prior to radiology test. Encouraged patient to call manufacture of Dexcom to let them know her sensor had to be removed for radiology test and see if they would send her a replacement sensor. Patient state that overall her glucose stay under 200 mg/dl which is what she states are her glucose goals. Patient states that she rarely has any hypoglycemia (reports maybe once a month).  Discussed current insulin orders and informed patient that I would  ask attending to change Semglee to 20 units QAM (to be given this morning instead of at bedtime). Patient verbalized understanding and has no questions at this time. ? ?Thanks, ?Orlando Penner, RN, MSN, CDE ?Diabetes Coordinator ?Inpatient Diabetes Program ?[phone] (Team Pager from 8am to 5pm) ? ? ? ?

## 2021-09-09 NOTE — Progress Notes (Addendum)
Initial Nutrition Assessment ? ?DOCUMENTATION CODES:  ? ?Non-severe (moderate) malnutrition in context of acute illness/injury ? ?INTERVENTION:  ?Ensure MAX BID ? ?Multivitamin daily ? ?Update current weight ? ?NUTRITION DIAGNOSIS:  ? ?Moderate Malnutrition related to acute illness as evidenced by energy intake < 75% for > 7 days, mild muscle depletion. ? ? ?GOAL:  ?Patient will meet greater than or equal to 90% of their needs (if this aligns with patient health care goals) ? ? ?MONITOR:  ?PO intake, Supplement acceptance, Labs, Weight trends ? ?REASON FOR ASSESSMENT:  ? ?Consult, Malnutrition Screening Tool ?Assessment of nutrition requirement/status ? ?ASSESSMENT: Patient is a 81 yo female with history of COPD (chronic O2), anemia, DM2, necrotizing pancreatitis (partial pancreatectomy).  Presents with acute on chronic respiratory failure.  ? ?Palliative consulted. Follow for outcome of discussion.  ? ?Patient family bedside. Her lunch is here but untouched. She ate peaches for breakfast. Complains of feeling nauseated and is requesting chicken broth. Patient had zofran at 0849 this morning. RD ordered the broth and encouraged her to try and Ensure when stomach is feeling better.  ? ?Patient reports minimal intake the past week- 1/2 pack oatmeal, saltine crackers and spoon of peanut butter- says, " I have been eating just enough to get my medicine down." Family member describes decline in patient functional status the past month.  ? ?Unable to walk without support per patient. Mild LE edema.  ? ?Most recent chart weight 05/25/21-81.9 kg. Daily weights ordered. Estimated nutrition needs calculated on 81.9 kg.  ? ?Labs and medications reviewed. Lasix 60 mg daily, insulin, oxycodone @1224 .  ? ?CBG (last 3)  ?Recent Labs  ?  09/09/21 ?0236 09/09/21 ?11/09/21 09/09/21 ?1114  ?GLUCAP 205* 141* 178*  ?  ? ?NUTRITION - FOCUSED PHYSICAL EXAM: ? ?Flowsheet Row Most Recent Value  ?Orbital Region Mild depletion  ?Upper Arm Region  No depletion  ?Thoracic and Lumbar Region No depletion  ?Buccal Region No depletion  ?Temple Region Mild depletion  ?Clavicle Bone Region No depletion  ?Clavicle and Acromion Bone Region Mild depletion  ?Scapular Bone Region No depletion  ?Dorsal Hand No depletion  ?Edema (RD Assessment) Mild  ?Hair Reviewed  ?Eyes Reviewed  ?Mouth Reviewed  ?Skin Reviewed  ?Nails Reviewed  ? ?  ? ? ?Diet Order:   ?Diet Order   ? ?       ?  Diet Carb Modified Fluid consistency: Thin; Room service appropriate? Yes  Diet effective now       ?  ? ?  ?  ? ?  ? ? ?EDUCATION NEEDS:  ?Education needs have been addressed ? ?Skin:  Skin Assessment: Reviewed RN Assessment ? ?Last BM:  4/4 ? ?Height:  ? ?Ht Readings from Last 1 Encounters:  ?09/25/2021 5\' 3"  (1.6 m)  ? ? ?Weight:  ? ?Wt Readings from Last 1 Encounters:  ?05/25/21 81.9 kg  ? ? ?Ideal Body Weight:   52 kg ? ?BMI:  Body mass index is 31.99 kg/m?. ? ?Estimated Nutritional Needs:  ? ?Kcal:  1500-1600 ? ?Protein:  73-78 gr ? ?Fluid:  1600 ml daily ? ? MS,RD,CSG,LDN ?Contact: AMION ? ? ?

## 2021-09-09 NOTE — Consult Note (Signed)
[facility] ?Consultation Oncology ? ?Name: Kathryn Morgan      MRN: [id number]    Location: D924/Q683-41  Date: 09/09/2021 Time:5:26 PM ? ? ?REFERRING PHYSICIAN: Dr. Arbutus Leas ? ?REASON FOR CONSULT: Mediastinal adenopathy, possible lung mass ?  ? ?HISTORY OF PRESENT ILLNESS: Kathryn Morgan is a 81 year old very pleasant white female who is seen in consultation today at the request of Dr. Arbutus Leas for further work-up and management of abnormal CT scan findings.  She was supposed to see me in the office on 10/03/2021 for her anemia.  However as she was feeling very weak, she came to the ER.  She reported 10 pound weight loss in the last 6 months.  She has also been placed on oxygen in the last 2 to 4 weeks.  She reported right-sided lower back pain radiating to the back of the thigh and to the leg for the last 4 weeks.  She has some cough with whitish thick expectoration but denies any hemoptysis.  A CT of the chest was done on 10/03/2021 in the ER which showed bulky bilateral hilar, subcarinal pretracheal adenopathy.  There is consolidation of both lower lobes with the mass not excluded on the right side with some necrotic area.  Hypodense hepatic and splenic lesions.  CT angiogram was repeated which was negative for pulmonary embolism.  She is an ex-smoker, quit between 20-25 years ago.  Smoked as a teenager 2 to 3 packs/day until her mid 67s.  She worked as a Clinical biochemist.  Family history consistent with breast cancer in paternal aunt. ? ?PAST MEDICAL HISTORY:   ?Past Medical History:  ?Diagnosis Date  ? Aortic stenosis   ? Status post 31mm [facility] Ease pericardial tissue valve  ? Asthma   ? Chronic kidney disease   ? COPD (chronic obstructive pulmonary disease) ([facility])   ? Depression   ? Dyslipidemia   ? Essential hypertension   ? Gallstone pancreatitis   ? 2005  ? GERD (gastroesophageal reflux disease)   ? History of cardiomyopathy   ? Necrotizing pancreatitis   ? Neuropathy   ? Osteoporosis   ? Rhabdomyolysis   ? Rheumatic  fever   ? Type 2 diabetes mellitus (H[facility])   ? Insulin pump  ? ? ?ALLERGIES: ?Allergies  ?Allergen Reactions  ? Amlodipine Shortness Of Breath  ?  Swelling - pcp stopped   ? Codeine Itching  ? Promethazine Hcl Anxiety and Other (See Comments)  ?  Pt and husband state severe hallucinations and paranoia from phenegran  ? Ace Inhibitors Cough  ? Adhesive [Tape] Other (See Comments)  ?  Skin tearing  ? Hydralazine   ?  States weakness and oxygen level dropped.  She stopped the medication  ? Levofloxacin Itching and Other (See Comments)  ?  Patient unsure of exact reaction  ? ?   ?MEDICATIONS: I have reviewed the patient's current medications.   ?  ?PAST SURGICAL HISTORY ?Past Surgical History:  ?Procedure Laterality Date  ? AORTIC VALVE REPLACEMENT  08/27/2011  ? Procedure: AORTIC VALVE REPLACEMENT (AVR);  Surgeon: Purcell Nails, MD;  Location: [facility] OR;  Service: Open Heart Surgery;  Laterality: N/A;  ? CHOLECYSTECTOMY    ? Debridement pancreas    ? ESOPHAGOGASTRODUODENOSCOPY  04/21/04  ? Normal esophagus/normal D1,D2; attempted  transgastric drainage/stent placement of pancreatic pseudocyst, no obvious location for gastrotomy, therefore transferred to [facility]  ? LEFT AND RIGHT HEART CATHETERIZATION WITH CORONARY ANGIOGRAM N/A 06/03/2011  ? Procedure: LEFT AND RIGHT HEART  CATHETERIZATION WITH CORONARY ANGIOGRAM;  Surgeon: Kathleene Hazel, MD;  Location: [facility] CATH LAB;  Service: Cardiovascular;  Laterality: N/A;  Right Heart Cath also  ? PANCREATIC PSEUDOCYST DRAINAGE    ? ? ?FAMILY HISTORY: ?Family History  ?Problem Relation Age of Onset  ? Heart attack Father   ? Heart disease Other   ?     CHF  ? Colon cancer Neg Hx   ? Anesthesia problems Neg Hx   ? ? ?SOCIAL HISTORY: ? reports that she quit smoking about 40 years ago. Her smoking use included cigarettes. She has a 45.00 pack-year smoking history. She has never used smokeless tobacco. She reports that she does not drink alcohol and does not use  drugs. ? ?PERFORMANCE STATUS: ?The patient's performance status is 3 - Symptomatic, >50% confined to bed ? ?PHYSICAL EXAM: ?Most Recent Vital Signs: Blood pressure (!) 133/58, pulse 93, temperature (!) 97.5 ?F (36.4 ?C), temperature source Oral, resp. rate 18, height 5\' 3"  (1.6 m), SpO2 96 %. ?BP (!) 133/58 (BP Location: Left Arm)   Pulse 93   Temp (!) 97.5 ?F (36.4 ?C) (Oral)   Resp 18   Ht 5\' 3"  (1.6 m)   SpO2 96%   BMI 31.99 kg/m?  ?General appearance: alert, cooperative, and appears stated age ?Lungs:  Decreased breath sounds bilaterally. ?Heart: regular rate and rhythm ?Extremities:  No edema or cyanosis. ?Neurologic: Grossly normal ? ?LABORATORY DATA:  ?Results for orders placed or performed during the hospital encounter of Sep 18, 2021 (from the past 48 hour(s))  ?Basic metabolic panel     Status: Abnormal  ? Collection Time: 09-18-21  9:08 AM  ?Result Value Ref Range  ? Sodium 129 (L) 135 - 145 mmol/L  ? Potassium 4.8 3.5 - 5.1 mmol/L  ? Chloride 85 (L) 98 - 111 mmol/L  ? CO2 34 (H) 22 - 32 mmol/L  ? Glucose, Bld 208 (H) 70 - 99 mg/dL  ?  Comment: Glucose reference range applies only to samples taken after fasting for at least 8 hours.  ? BUN 29 (H) 8 - 23 mg/dL  ? Creatinine, Ser 0.59 0.44 - 1.00 mg/dL  ? Calcium 10.3 8.9 - 10.3 mg/dL  ? GFR, Estimated >60 >60 mL/min  ?  Comment: (NOTE) ?Calculated using the CKD-EPI Creatinine Equation (2021) ?  ? Anion gap 10 5 - 15  ?  Comment: Performed at [facility], [address], [place], [address] [place]  ?CBC     Status: Abnormal  ? Collection Time: 2021-09-18  9:08 AM  ?Result Value Ref Range  ? WBC 12.4 (H) 4.0 - 10.5 K/uL  ? RBC 4.16 3.87 - 5.11 MIL/uL  ? Hemoglobin 10.7 (L) 12.0 - 15.0 g/dL  ? HCT 34.3 (L) 36.0 - 46.0 %  ? MCV 82.5 80.0 - 100.0 fL  ? MCH 25.7 (L) 26.0 - 34.0 pg  ? MCHC 31.2 30.0 - 36.0 g/dL  ? RDW 15.6 (H) 11.5 - 15.5 %  ? Platelets 396 150 - 400 K/uL  ? nRBC 0.0 0.0 - 0.2 %  ?  Comment: Performed at [facility], [address]  [place], [address] [place]  ?Troponin I (High Sensitivity)     Status: None  ? Collection Time: 2021/09/18  9:08 AM  ?Result Value Ref Range  ? Troponin I (High Sensitivity) 8 <18 ng/L  ?  Comment: (NOTE) ?Elevated high sensitivity troponin I (hsTnI) values and significant  ?changes across serial measurements may suggest ACS but many other  ?  chronic and acute conditions are known to elevate hsTnI results.  ?Refer to the "Links" section for chest pain algorithms and additional  ?guidance. ?Performed at [facility], [address] [place], [place] [address] ?  ?Brain natriuretic peptide     Status: Abnormal  ? Collection Time: 09/17/2021  9:08 AM  ?Result Value Ref Range  ? B Natriuretic Peptide 180.0 (H) 0.0 - 100.0 pg/mL  ?  Comment: Performed at [facility], [address], [place], [place] [address]  ?Hepatic function panel     Status: Abnormal  ? Collection Time: 09/27/2021  9:08 AM  ?Result Value Ref Range  ? Total Protein 7.1 6.5 - 8.1 g/dL  ? Albumin 3.0 (L) 3.5 - 5.0 g/dL  ? AST 17 15 - 41 U/L  ? ALT 18 0 - 44 U/L  ? Alkaline Phosphatase 70 38 - 126 U/L  ? Total Bilirubin 0.5 0.3 - 1.2 mg/dL  ? Bilirubin, Direct 0.1 0.0 - 0.2 mg/dL  ? Indirect Bilirubin 0.4 0.3 - 0.9 mg/dL  ?  Comment: Performed at [facility], [address], [place], [place] [address]  ?Hemoglobin A1c     Status: Abnormal  ? Collection Time: 09/18/2021  9:08 AM  ?Result Value Ref Range  ? Hgb A1c MFr Bld 7.7 (H) 4.8 - 5.6 %  ?  Comment: (NOTE) ?        Prediabetes: 5.7 - 6.4 ?        Diabetes: >6.4 ?        Glycemic control for adults with diabetes: <7.0 ?  ? Mean Plasma Glucose 174 mg/dL  ?  Comment: (NOTE) ?Performed At: [facility] ?[address] [place], [place] [id number] ?Jolene Schimke MD [id number] ?  ?Troponin I (High Sensitivity)     Status: None  ? Collection Time: 09/30/2021 11:20 AM  ?Result Value Ref Range  ? Troponin I (High Sensitivity) 9 <18 ng/L  ?  Comment: (NOTE) ?Elevated high sensitivity troponin I (hsTnI) values and  significant  ?changes across serial measurements may suggest ACS but many other  ?chronic and acute conditions are known to elevate hsTnI results.  ?Refer to the "Links" section for chest pain algorithms and additional  ?guidance. ?Perform

## 2021-09-09 NOTE — Consult Note (Signed)
? ?                                                                                ?Consultation Note ?Date: 09/09/2021  ? ?Patient Name: Kathryn Morgan  ?DOB: June 23, 1940  MRN: [id number]  Age / Sex: 81 y.o., female  ?PCP: Pablo Lawrence, NP ?Referring Physician: Orson Eva, MD ? ?Reason for Consultation: Establishing goals of care and Psychosocial/spiritual support ? ?HPI/Patient Profile: 81 y.o. female  with past medical history of former smoker with oxygen dependent COPD, secondary cardiomyopathy, aortic valve replacement, DM 2, history of necrotizing pancreatitis/partial pancreectomy in 2011 while in [place] with extended hospital stay including time at [facility], osteoporosis/pi?a, GERD, and HLD, CKD followed by Dr. Theador Hawthorne, recent diagnosis of issues with parathyroid and increased calcium, admitted on 09/06/2021 with acute on chronic respiratory failure with hypoxia, lobar pneumonia, hilar lymphadenopathy.  ? ?Clinical Assessment and Goals of Care: ?I have reviewed medical records including EPIC notes, labs and imaging, received report from RN, assessed the patient.  Kathryn Morgan is lying quietly in bed.  She greets me, making and mostly keeping eye contact.  She appears acutely/chronically ill and quite frail, but obese.  She is alert and oriented x3, able to make her needs known.  Her daughter, Jeral Fruit, is at bedside. ? ?We met at the bedside along with daughter Jeral Fruit to discuss diagnosis prognosis, GOC, EOL wishes, disposition and options.  I introduced Palliative Medicine as specialized medical care for people living with serious illness. It focuses on providing relief from the symptoms and stress of a serious illness. The goal is to improve quality of life for both the patient and the family. ? ?We discussed a brief life review of the patient.  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  She has been married to her husband, Eduard Clos, for 73 years.  They have 2 children, Jody and [place].  Jeral Fruit shares that Kathryn Morgan has had a  marked decline over the last 1 month.  She spends most of her time in her recliner sleeping, has not been able to sleep in her bed for a month.  They also endorse a decreased appetite and weight loss over the last 1 month.  It is also noted that Kathryn Morgan has had severe right hip pain for the last 1 month.   ? ? ?We then focused on their current illness.  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  She shares that she has been feeling poorly for about a month.  We talk about consult with trusted oncologist, Dr. Delton Coombes after his clinic day.  Family shares that Kathryn Morgan's husband, Simrit Gohlke, was told that he had cancer about a year ago and after further testing with Dr. Raliegh Ip it was found that he did not have cancer.  We talk about time for further testing consultations.  The natural disease trajectory and expectations at EOL were discussed. ? ?Kathryn Morgan and Kathryn Morgan share concerns about her iron levels, she was supposed to have another iron infusion on Friday.  They also share their concerns about her parathyroid problems and increased calcium. ? ?  Advanced directives, concepts specific to code status, were considered and discussed, especially in light of Kathryn Morgan's experiences around 2011 when she had to spend time and then core with the trach and relearn to walk.  I ask if she would be willing to go through intubation/life support again.  She shares that at this point she does not believe that she would accept life support.  We talk about the concept of "treat the treatable, but allowing natural death".  I encourage Kathryn Morgan to talk with her family and pray about her choices. ? ?Discussed the importance of continued conversation with family and the medical providers regarding overall plan of care and treatment options, ensuring decisions are within the context of the patient?s values and GOCs.  Questions and concerns were addressed.  Hard  Choices booklet left for review. The patient and family was encouraged to call with questions or concerns.  PMT will continue to support holistically. ? ?Conference with attending, bedside nursing staff, transition of care team related to patient condition, needs, goals of care, disposition. ?PMT to follow ? ? ?HCPOA ?HCPOA -spouse of 92 years, Kathryn Morgan. ?  ? ?SUMMARY OF RECOMMENDATIONS   ?At this point continue to treat the treatable ?Considering CODE STATUS ?Awaiting recommendations from trusted oncologist ? ? ?Code Status/Advance Care Planning: ?Full code -we talked about the concept of "treat the treatable, but allowing natural passing". ? ?Symptom Management:  ?Per hospitalist, no additional needs at this time. ? ?Palliative Prophylaxis:  ?Frequent Pain Assessment, Oral Care, and Turn Reposition ? ?Additional Recommendations (Limitations, Scope, Preferences): ?Full Scope Treatment ? ?Psycho-social/Spiritual:  ?Desire for further Chaplaincy support:no ?Additional Recommendations: Caregiving  Support/Resources, Education on Hospice, and Grief/Bereavement Support ? ?Prognosis:  ?Unable to determine, based on outcomes.  Guarded at this point.  Awaiting recommendations from trusted oncologist. ? ?Discharge Planning:  To be determined, based on outcomes.  Kathryn Morgan states her preference is to return home, her daughter states that they will care for her if this is her wish.   ? ?  ? ?Primary Diagnoses: ?Present on Admission: ? COPD (chronic obstructive pulmonary disease) ([place]) ? Mixed hyperlipidemia ? Abnormal weight loss ? Essential hypertension, benign ? Hilar lymphadenopathy ? Iron deficiency anemia ? ? ?I have reviewed the medical record, interviewed the patient and family, and examined the patient. The following aspects are pertinent. ? ?Past Medical History:  ?Diagnosis Date  ? Aortic stenosis   ? Status post 7m [facility] pericardial tissue valve  ? Asthma   ? Chronic kidney disease   ? COPD  (chronic obstructive pulmonary disease) ([place]   ? Depression   ? Dyslipidemia   ? Essential hypertension   ? Gallstone pancreatitis   ? 2005  ? GERD (gastroesophageal reflux disease)   ? History of cardiomyopathy   ? Necrotizing pancreatitis   ? Neuropathy   ? Osteoporosis   ? Rhabdomyolysis   ? Rheumatic fever   ? Type 2 diabetes mellitus ([place]   ? Insulin pump  ? ?Social History  ? ?Socioeconomic History  ? Marital status: Married  ?  Spouse name: Not on file  ? Number of children: Not on file  ? Years of education: Not on file  ? Highest education level: Not on file  ?Occupational History  ? Not on file  ?Tobacco Use  ? Smoking status: Former  ?  Packs/day: 3.00  ?  Years: 15.00  ?  Pack years: 45.00  ?  Types: Cigarettes  ?  Quit date: 06/09/1981  ?  Years since quitting: 40.2  ? Smokeless tobacco: Never  ? Tobacco comments:  ?  quit about 20+ yrs ago  ?Vaping Use  ? Vaping Use: Never used  ?Substance and Sexual Activity  ? Alcohol use: No  ?  Alcohol/week: 0.0 standard drinks  ? Drug use: No  ? Sexual activity: Not on file  ?Other Topics Concern  ? Not on file  ?Social History Narrative  ? Not on file  ? ?Social Determinants of Health  ? ?Financial Resource Strain: Not on file  ?Food Insecurity: Not on file  ?Transportation Needs: Not on file  ?Physical Activity: Not on file  ?Stress: Not on file  ?Social Connections: Not on file  ? ?Family History  ?Problem Relation Age of Onset  ? Heart attack Father   ? Heart disease Other   ?     CHF  ? Colon cancer Neg Hx   ? Anesthesia problems Neg Hx   ? ?Scheduled Meds: ? arformoterol  15 mcg Nebulization BID  ? And  ? umeclidinium bromide  1 puff Inhalation Daily  ? aspirin EC  81 mg Oral Daily  ? carvedilol  3.125 mg Oral BID  ? enoxaparin (LOVENOX) injection  40 mg Subcutaneous Q24H  ? febuxostat  40 mg Oral Daily  ? furosemide  60 mg Oral Daily  ? insulin aspart  0-15 Units Subcutaneous TID WC  ? insulin aspart  0-5 Units Subcutaneous QHS  ? insulin glargine-yfgn  20  Units Subcutaneous QHS  ? Ensure Max Protein  11 oz Oral BID  ? sacubitril-valsartan  1 tablet Oral BID  ? simvastatin  20 mg Oral q1800  ? ?Continuous Infusions: ? azithromycin    ? cefTRIAXone (ROCEP

## 2021-09-09 NOTE — Progress Notes (Signed)
?  ?       ?PROGRESS NOTE ? ?MAKYLEE LAPEYROUSE [id number] DOB: [birth year] DOA: 09/18/2021 ?PCP: Pablo Lawrence, NP ? ?Brief History:  ?81 year old female former smoker with oxygen dependent COPD, secondary cardiomyopathy, history of aortic valve replacement, type 2 diabetes mellitus, history of necrotizing pancreatitis, status post partial pancreatectomy, osteoporosis, rheumatic fever, GERD, dyslipidemia, CKD presented to the emergency department with chest pain and shortness of breath symptoms.  She has had a difficult time for the past several months with an unexplained iron deficiency anemia.  She has been seen by hematology and has received an iron infusion and was due to have another iron infusion later in the week.  She is due to establish care with Dr. Delton Coombes earlier today.  She reports unexplained weight loss.  She also reports having a lot of GI symptoms in the last 2 weeks with increased flatulence and irregular bowel habits.  She has been dealing with a chronic dry mostly nonproductive cough for the past several weeks.  She saw her PCP and they felt that it could be an underlying pneumonia versus recurrent pulmonary edema.  She was treated with a short course of antibiotics.  She had no significant improvement or change from treatment.  She reports that she has had increasing malaise and weakness and not able to sleep at night due to difficulty breathing.  She reports having to sit up in a recliner for the past several weeks.  She reports peripheral edema.  She has been taking her oral Lasix and Entresto regularly.  She was seen by her nephrologist Dr. Theador Hawthorne and being worked up for this as well.  Of note she reports that she has not had a colonoscopy or upper endoscopy screening done. ? ?Pt was seen in ED and her CXR was concerning for a lung mass.  She was sent for CT chest. It showed bulky bilateral hilar, infrahilar, subcarinal, paratracheal adenopathy in the chest compatible with malignancy.  Possibilities could include lymphoproliferative disease or lung cancer.  Dr. Delton Coombes was consulted and said he would see patient in hospital.  CT also showed findings that suggest underlying lung infection and edema.  She was started on IV antibiotics for pneumonia and admission was requested for further management.    ? ? ? ?Assessment and Plan: ?Acute on chronic respiratory failure with hypoxia ([place]) ?-- pt at baseline is on 3L/min supplemental oxygen  ?-- currently on 5L ?-- multifactorial including COPD, PNA, possible PE ?-- CT chest--bulky hilar LN, bilateral LL consolidation with question RLL mass; sm bilateral pleural effusions ?-check D-dimer ? ?Lobar pneumonia ([place]) ?Continue ceftriaxone and azithro ? ?Hilar lymphadenopathy ?--follow up with oncology recommendations ?--patient requesting pulmonary eval ? ?COPD (chronic obstructive pulmonary disease) ([place]) ?--resume home bronchodilators (or hospital substitutions) ?--no symptoms of acute exacerbation at this time  ? ? ?Essential hypertension, benign ?--resume coreg ? ?DM (diabetes mellitus), type 2 ([place]) ?-- reduced dose semglee ?-- novolog sliding scale ?4/4 A1C--7.7 ? ?Iron deficiency anemia ?--Pt has been receiving IV iron infusions and Hg up to 10.  ?-- reports never had colon cancer screening  ?-- consulted Dr. Delton Coombes  ?--check iron studies ? ?Abnormal weight loss ?--concerning for malignancy ?--working up as noted  ? ?S/P aortic valve replacement ?--bioprosthetic valve 2013 ?--04/06/21 Echo EF 55-60%, normal valve function ? ?Secondary cardiomyopathy ([place]) ?--Pt has been well managed medically with coreg, entresto, lasix ?--04/06/21  Echo with EF 55-60% with grade 1 DD and replaced aortic valve (October 2022) ? ?  Mixed hyperlipidemia ?--resume home statin therapy  ? ? ? ? ? ? ? ? ?Status is: Inpatient ?Remains inpatient appropriate because: severity of illness requiring IV abx ? ? ? ?Family Communication:   spouse updated at bedside  4/5 ? ?Consultants:  pulm, med onc, palliative ? ?Code Status:  FULL  ? ?DVT Prophylaxis: [place] Lovenox ? ? ?Procedures: ?As Listed in Progress Note Above ? ?Antibiotics: ?Ceftriaxone 4/4>> ?Azithro 4/4>> ? ? ? ?Subjective: ?She complains of sob, a little better.  Denies f/c, cp, n/v/d, abd pain, hematochezia, melena ? ?Objective: ?Vitals:  ? 10/02/2021 2202 09/09/21 0234 09/09/21 0515 09/09/21 0745  ?BP: (!) 142/69 (!) 109/51 (!) 119/52   ?Pulse: 98 90 89   ?Resp: 18 18 18    ?Temp: 97.6 ?F (36.4 ?C) 97.6 ?F (36.4 ?C) 97.7 ?F (36.5 ?C)   ?TempSrc:      ?SpO2: 91% 100% 100% 98%  ?Height:      ? ? ?Intake/Output Summary (Last 24 hours) at 09/09/2021 0827 ?Last data filed at 09/09/2021 0500 ?Gross per 24 hour  ?Intake 440 ml  ?Output --  ?Net 440 ml  ? ?Weight change:  ?Exam: ? ?General:  Pt is alert, follows commands appropriately, not in acute distress ?HEENT: No icterus, No thrush, No neck mass, [place]/AT ?Cardiovascular: RRR, S1/S2, no rubs, no gallops ?Respiratory: diminished BS, no wheeze.  Bibasilar rales ?Abdomen: Soft/+BS, non tender, non distended, no guarding ?Extremities: trace LE edema, No lymphangitis, No petechiae, No rashes, no synovitis ? ? ?Data Reviewed: ?I have personally reviewed following labs and imaging studies ?Basic Metabolic Panel: ?Recent Labs  ?Lab 09/05/2021 ?0908 09/09/21 ?[id number]  ?NA 129* 133*  ?K 4.8 4.9  ?CL 85* 85*  ?CO2 34* 39*  ?GLUCOSE 208* 183*  ?BUN 29* 28*  ?CREATININE 0.59 0.88  ?CALCIUM 10.3 10.2  ?MG  --  1.6*  ? ?Liver Function Tests: ?Recent Labs  ?Lab 09/13/2021 ?0908 09/09/21 ?[id number]  ?AST 17 14*  ?ALT 18 13  ?ALKPHOS 70 62  ?BILITOT 0.5 0.4  ?PROT 7.1 6.5  ?ALBUMIN 3.0* 2.6*  ? ?No results for input(s): LIPASE, AMYLASE in the last 168 hours. ?No results for input(s): AMMONIA in the last 168 hours. ?Coagulation Profile: ?No results for input(s): INR, PROTIME in the last 168 hours. ?CBC: ?Recent Labs  ?Lab 09/14/2021 ?0908 09/09/21 ?[id number]  ?WBC 12.4* 12.9*  ?NEUTROABS  --  10.5*  ?HGB 10.7* 9.8*   ?HCT 34.3* 33.3*  ?MCV 82.5 86.3  ?PLT 396 378  ? ?Cardiac Enzymes: ?No results for input(s): CKTOTAL, CKMB, CKMBINDEX, TROPONINI in the last 168 hours. ?BNP: ?Invalid input(s): POCBNP ?CBG: ?Recent Labs  ?Lab 09/30/2021 ?1637 09/20/2021 ?2204 09/09/21 ?0236 09/09/21 ?[id number]  ?GLUCAP 210* 223* 205* 141*  ? ?HbA1C: ?Recent Labs  ?  09/07/2021 ?0908  ?HGBA1C 7.7*  ? ?Urine analysis: ?   ?Component Value Date/Time  ? COLORURINE YELLOW 12/09/2013 0835  ? APPEARANCEUR HAZY (A) 12/09/2013 0835  ? LABSPEC 1.010 12/09/2013 0835  ? PHURINE 5.5 12/09/2013 0835  ? GLUCOSEU NEGATIVE 12/09/2013 0835  ? [place] NEGATIVE 12/09/2013 0835  ? [place] NEGATIVE 12/09/2013 0835  ? [place] NEGATIVE 12/09/2013 0835  ? [place] NEGATIVE 12/09/2013 0835  ? UROBILINOGEN 0.2 12/09/2013 0835  ? NITRITE NEGATIVE 12/09/2013 0835  ? LEUKOCYTESUR NEGATIVE 12/09/2013 0835  ? ?Sepsis Labs: ?@[email or web address] (procalcitonin:4,lacticidven:4) ?)No results found for this or any previous visit (from the past 240 hour(s)).  ? ?Scheduled Meds: ? arformoterol  15 mcg Nebulization BID  ? And  ? umeclidinium bromide  1 puff Inhalation Daily  ? aspirin EC  81 mg Oral Daily  ? carvedilol  3.125 mg Oral BID  ? enoxaparin (LOVENOX) injection  40 mg Subcutaneous Q24H  ? febuxostat  40 mg Oral Daily  ? furosemide  60 mg Oral Daily  ? insulin aspart  0-15 Units Subcutaneous TID WC  ? insulin aspart  0-5 Units Subcutaneous QHS  ? insulin glargine-yfgn  20 Units Subcutaneous QHS  ? sacubitril-valsartan  1 tablet Oral BID  ? simvastatin  20 mg Oral q1800  ? ?Continuous Infusions: ? azithromycin    ? cefTRIAXone (ROCEPHIN)  IV    ? ? ?Procedures/Studies: ?DG Chest 2 View ? ?Result Date: 08/28/2021 ?CLINICAL DATA:  Hypoxia, dyspnea EXAM: CHEST - 2 VIEW COMPARISON:  10/05/2016 chest radiograph. FINDINGS: Intact sternotomy wires. Aortic valve prosthesis in place. Cholecystectomy clips are seen in the right upper quadrant of the abdomen. Stable cardiomediastinal silhouette with mild  cardiomegaly. No pneumothorax. Small bilateral pleural effusions. Hazy patchy bilateral mid to lower lung opacities. IMPRESSION: 1. Stable mild cardiomegaly. Nonspecific hazy patchy bilateral mid to lower lung opaci

## 2021-09-09 NOTE — Progress Notes (Signed)
Pt on 6lpm cann when RT entered room spo2 98% o2 decreased to 4lpm cann per patients baseline and home regimen RT and Nurse will continue to monitor ?

## 2021-09-09 NOTE — Assessment & Plan Note (Addendum)
Continue ceftriaxone and azithro ?PCT 0.81>>1.57 ?Finished 7 days abx ?

## 2021-09-10 DIAGNOSIS — I10 Essential (primary) hypertension: Secondary | ICD-10-CM | POA: Diagnosis not present

## 2021-09-10 DIAGNOSIS — D509 Iron deficiency anemia, unspecified: Secondary | ICD-10-CM | POA: Diagnosis not present

## 2021-09-10 DIAGNOSIS — E875 Hyperkalemia: Secondary | ICD-10-CM

## 2021-09-10 DIAGNOSIS — Z7189 Other specified counseling: Secondary | ICD-10-CM | POA: Diagnosis not present

## 2021-09-10 DIAGNOSIS — J9621 Acute and chronic respiratory failure with hypoxia: Secondary | ICD-10-CM | POA: Diagnosis not present

## 2021-09-10 DIAGNOSIS — R634 Abnormal weight loss: Secondary | ICD-10-CM | POA: Diagnosis not present

## 2021-09-10 DIAGNOSIS — E440 Moderate protein-calorie malnutrition: Secondary | ICD-10-CM

## 2021-09-10 DIAGNOSIS — Z515 Encounter for palliative care: Secondary | ICD-10-CM | POA: Diagnosis not present

## 2021-09-10 DIAGNOSIS — R590 Localized enlarged lymph nodes: Secondary | ICD-10-CM | POA: Diagnosis not present

## 2021-09-10 LAB — LACTATE DEHYDROGENASE: LDH: 393 U/L — ABNORMAL HIGH (ref 98–192)

## 2021-09-10 LAB — COMPREHENSIVE METABOLIC PANEL
ALT: 13 U/L (ref 0–44)
AST: 13 U/L — ABNORMAL LOW (ref 15–41)
Albumin: 2.6 g/dL — ABNORMAL LOW (ref 3.5–5.0)
Alkaline Phosphatase: 86 U/L (ref 38–126)
Anion gap: 10 (ref 5–15)
BUN: 36 mg/dL — ABNORMAL HIGH (ref 8–23)
CO2: 35 mmol/L — ABNORMAL HIGH (ref 22–32)
Calcium: 10 mg/dL (ref 8.9–10.3)
Chloride: 87 mmol/L — ABNORMAL LOW (ref 98–111)
Creatinine, Ser: 1.29 mg/dL — ABNORMAL HIGH (ref 0.44–1.00)
GFR, Estimated: 42 mL/min — ABNORMAL LOW (ref >60)
Glucose, Bld: 289 mg/dL — ABNORMAL HIGH (ref 70–99)
Potassium: 5.5 mmol/L — ABNORMAL HIGH (ref 3.5–5.1)
Sodium: 132 mmol/L — ABNORMAL LOW (ref 135–145)
Total Bilirubin: 0.7 mg/dL (ref 0.3–1.2)
Total Protein: 6.3 g/dL — ABNORMAL LOW (ref 6.5–8.1)

## 2021-09-10 LAB — CBC WITH DIFFERENTIAL/PLATELET
Abs Immature Granulocytes: 0.24 10*3/uL — ABNORMAL HIGH (ref 0.00–0.07)
Basophils Absolute: 0 10*3/uL (ref 0.0–0.1)
Basophils Relative: 0 %
Eosinophils Absolute: 0 10*3/uL (ref 0.0–0.5)
Eosinophils Relative: 0 %
HCT: 33.4 % — ABNORMAL LOW (ref 36.0–46.0)
Hemoglobin: 9.7 g/dL — ABNORMAL LOW (ref 12.0–15.0)
Immature Granulocytes: 2 %
Lymphocytes Relative: 5 %
Lymphs Abs: 0.5 10*3/uL — ABNORMAL LOW (ref 0.7–4.0)
MCH: 25.1 pg — ABNORMAL LOW (ref 26.0–34.0)
MCHC: 29 g/dL — ABNORMAL LOW (ref 30.0–36.0)
MCV: 86.3 fL (ref 80.0–100.0)
Monocytes Absolute: 0.2 10*3/uL (ref 0.1–1.0)
Monocytes Relative: 2 %
Neutro Abs: 10.1 10*3/uL — ABNORMAL HIGH (ref 1.7–7.7)
Neutrophils Relative %: 91 %
Platelets: 377 10*3/uL (ref 150–400)
RBC: 3.87 MIL/uL (ref 3.87–5.11)
RDW: 15.9 % — ABNORMAL HIGH (ref 11.5–15.5)
WBC: 11.1 10*3/uL — ABNORMAL HIGH (ref 4.0–10.5)
nRBC: 0 % (ref 0.0–0.2)

## 2021-09-10 LAB — TROPONIN I (HIGH SENSITIVITY)
Troponin I (High Sensitivity): 8 ng/L (ref <18)
Troponin I (High Sensitivity): 7 ng/L (ref <18)

## 2021-09-10 LAB — VITAMIN B12: Vitamin B-12: 767 pg/mL (ref 180–914)

## 2021-09-10 LAB — GLUCOSE, CAPILLARY
Glucose-Capillary: 226 mg/dL — ABNORMAL HIGH (ref 70–99)
Glucose-Capillary: 266 mg/dL — ABNORMAL HIGH (ref 70–99)
Glucose-Capillary: 342 mg/dL — ABNORMAL HIGH (ref 70–99)
Glucose-Capillary: 200 mg/dL — ABNORMAL HIGH (ref 70–99)

## 2021-09-10 LAB — RETICULOCYTES
Immature Retic Fract: 22.1 % — ABNORMAL HIGH (ref 2.3–15.9)
RBC.: 3.81 MIL/uL — ABNORMAL LOW (ref 3.87–5.11)
Retic Count, Absolute: 61 10*3/uL (ref 19.0–186.0)
Retic Ct Pct: 1.6 % (ref 0.4–3.1)

## 2021-09-10 LAB — FOLATE: Folate: 27.9 ng/mL (ref >5.9)

## 2021-09-10 LAB — MAGNESIUM: Magnesium: 2.2 mg/dL (ref 1.7–2.4)

## 2021-09-10 MED ORDER — INSULIN GLARGINE-YFGN 100 UNIT/ML ~~LOC~~ SOLN
20.0000 [IU] | Freq: Every day | SUBCUTANEOUS | Status: DC
  Administered 2021-09-10 – 2021-09-15 (×6): 20 [IU] via SUBCUTANEOUS
  Filled 2021-09-10 (×8): qty 0.2

## 2021-09-10 MED ORDER — SODIUM ZIRCONIUM CYCLOSILICATE 10 G PO PACK
10.0000 g | PACK | Freq: Once | ORAL | Status: AC
  Administered 2021-09-10: 10 g via ORAL
  Filled 2021-09-10: qty 1

## 2021-09-10 MED ORDER — LORATADINE 10 MG PO TABS
10.0000 mg | ORAL_TABLET | Freq: Every day | ORAL | Status: DC
  Administered 2021-09-10 – 2021-09-14 (×5): 10 mg via ORAL
  Filled 2021-09-10 (×6): qty 1

## 2021-09-10 NOTE — Progress Notes (Addendum)
Palliative: ?Kathryn Morgan is lying quietly in bed.  She greets me, making and somewhat keeping eye contact. She appears acutely/chronically ill and frail, obese.  She is alert and oriented X3, able to make her basic needs known.  Daughter, Kathryn Morgan is at bedside.   ? ?We talk about visit with trusted oncologist, Dr. Ellin Saba and the plan.  We briefly discuss results from lumbar images yesterday.  We talk about lesions on S1 and iliac bones, encouraging that she needs to have PET scan and also biopsy. Patient and family share that they will keep peace and follow with oncology.  ?Kathryn Morgan shares that she feels a tightness across her chest.  We talk about her visit with pulmonology yesterday and recommendations. ? ?We discussed CODE STATUS.  Kathryn Morgan states that due to her experience in 2011 she would not want to be on ventilator support again in the future.  Although daughter Kathryn Morgan states that both she and Kathryn Morgan and husband agree, they are not ready to elect to DNR today.  MOST form shared with patient and daughter with explanation. ? ?Conference with attending, trusted oncologist, bedside nursing staff, [facility] team related to patient condition, needs, GOC, disposition.  ? ?Plan:  At this point FULL scope/code, considering "treat the treatable"/DNI.   Anticipate home with [facility] if qualified, follow up outpatient oncology.  ? ?50 minutes  ?Lillia Carmel, NP ?Team phone [phone] ?Greater than 50% of this time was spent counseling and coordinating care related to the above assessment and plan.  ? ?

## 2021-09-10 NOTE — Progress Notes (Signed)
?  ?       ?PROGRESS NOTE ? ?Kathryn Morgan [id number] DOB: 18-Oct-1940 DOA: 09/22/2021 ?PCP: Pablo Lawrence, NP ? ?Brief History:  ?81 year old female former smoker with oxygen dependent COPD (3L), secondary cardiomyopathy, history of bioprosthetic aortic valve replacement, type 2 diabetes mellitus, history of necrotizing pancreatitis, status post partial pancreatectomy, osteoporosis, rheumatic fever, GERD, dyslipidemia, CKD presented to the emergency department with chest pain and shortness of breath symptoms.  She has had a difficult time for the past several months with an unexplained iron deficiency anemia.  She has been seen by hematology and has received an iron infusion and was due to have another iron infusion later in the week.  She is due to establish care with Dr. Delton Coombes 09/24/2021.  She reports unexplained weight loss. She has been dealing with a chronic dry mostly nonproductive cough for the past several weeks.  She saw her PCP and they felt that it could be an underlying pneumonia versus recurrent pulmonary edema.  She was treated with a short course of antibiotics recently.  She had no significant improvement or change from treatment.  She reports that she has had increasing malaise and generalized weakness now requiring assistance to get OOB.  She reports having to sit up in a recliner for the past several weeks to sleep due to orthopnea symptoms.  She has been taking her oral Lasix and Entresto regularly.  Of note she reports that she has not had a colonoscopy or upper endoscopy screening done. ? ?Pt was seen in ED and her CXR was concerning for a lung mass.  She was sent for CT chest. It showed bulky bilateral hilar, infrahilar, subcarinal, paratracheal adenopathy in the chest compatible with malignancy. Possibilities could include lymphoproliferative disease or lung cancer.  Dr. Delton Coombes was consulted and said he would see patient in hospital.  CT also showed findings that suggest  underlying lung infection and edema.  She was started on IV antibiotics for pneumonia and admission was requested for further management.    ? ? ? ?Assessment and Plan: ?Acute on chronic respiratory failure with hypoxia ([place]) ?-- pt at baseline is on 3L/min supplemental oxygen at home ?-- currently on 5L>>4L ?-- multifactorial including COPD, PNA, possible PE ?-- CT chest--bulky hilar LN, bilateral LL consolidation with question RLL mass; sm bilateral pleural effusions ?-check D-dimer 1.92 ?4/5 CTA chest--no PE; bulky mediastinal LN, bilateral LL consolidation, 5 cm LLL mass; lytic lesion to R-6th rib ? ?Lobar pneumonia ([place]) ?Continue ceftriaxone and azithro ?PCT 0.81 ? ?Hilar lymphadenopathy ?--follow up with oncology recommendations ?--pulmonary consult appreciated>>PET, then EBUS ?--appreciate med onc consult>>same plan  ? ?COPD (chronic obstructive pulmonary disease) ([place]) ?--resume home bronchodilators (or hospital substitutions) ?--no symptoms of acute exacerbation at this time  ? ? ?Essential hypertension, benign ?--resumed coreg ? ?DM (diabetes mellitus), type 2 ([place]) ?-- reduced dose semglee ?-- novolog sliding scale ?4/4 A1C--7.7 ?--add novolog 4 units with meals ? ?Hyperkalemia ?D/c ARB ?-lokelma x 1 ?-am BMP ? ?Hypercalcemia of malignancy ?Corrected calcium 11.1 ?-continue IVF ?-intact PTH ?-PTHrp ? ?Malnutrition of moderate degree ?Started on nutritional supplements ? ?Iron deficiency anemia ?--Pt has been receiving IV iron infusions and Hg up to 10.  ?-- reports never had colon cancer screening  ?-- Dr. Delton Coombes consult appreciated ?--iron sat 16%, ferritin 637 ? ?Abnormal weight loss ?--concerning for malignancy ?--working up as noted  ? ?S/P aortic valve replacement ?--bioprosthetic valve 2013 ?--04/06/21 Echo EF 55-60%, normal valve function ? ?Secondary cardiomyopathy ([place]) ?--  Pt has been well managed medically with coreg, entresto, lasix ?--04/06/21  Echo with EF 55-60% with grade 1 DD and  replaced aortic valve (October 2022) ?--pulm recommends d/c entresto ? ?Mixed hyperlipidemia ?--resume home statin therapy  ? ? ?Status is: Inpatient ?Remains inpatient appropriate because: severity of illness requiring IV abx, increase oxygen requirement ?  ?  ?  ?Family Communication:   daughter updated at bedside 4/6 ?  ?Consultants:  pulm, med onc, palliative ?  ?Code Status:  FULL  ?  ?DVT Prophylaxis: [place] Lovenox ?  ?  ?Procedures: ?As Listed in Progress Note Above ?  ?Antibiotics: ?Ceftriaxone 4/4>> ?Azithro 4/4>> ?  ? ? ? ? ? ? ? ? ? ? ?Subjective: ?Patient states her sob is improving.  She complains of chest congestion with dry cough.  Denies f/c, cp, sob, n/v/d, abd pain. ? ?Objective: ?Vitals:  ? 09/09/21 2131 09/10/21 0502 09/10/21 0753 09/10/21 1341  ?BP: 131/63 (!) 122/58  140/60  ?Pulse: 100 97  97  ?Resp: 18 16  16   ?Temp: (!) 97.5 ?F (36.4 ?C) (!) 97.4 ?F (36.3 ?C)  97.7 ?F (36.5 ?C)  ?TempSrc:  Oral  Oral  ?SpO2: 96% 93% 94% 91%  ?Weight:  82.5 kg    ?Height:      ? ? ?Intake/Output Summary (Last 24 hours) at 09/10/2021 1719 ?Last data filed at 09/10/2021 1300 ?Gross per 24 hour  ?Intake 830 ml  ?Output 900 ml  ?Net -70 ml  ? ?Weight change:  ?Exam: ? ?General:  Pt is alert, follows commands appropriately, not in acute distress ?HEENT: No icterus, No thrush, No neck mass, [place]/AT ?Cardiovascular: RRR, S1/S2, no rubs, no gallops ?Respiratory: bibasilar rales.  No wheeze ?Abdomen: Soft/+BS, non tender, non distended, no guarding ?Extremities: No edema, No lymphangitis, No petechiae, No rashes, no synovitis ? ? ?Data Reviewed: ?I have personally reviewed following labs and imaging studies ?Basic Metabolic Panel: ?Recent Labs  ?Lab 09/05/2021 ?0908 09/09/21 ?[id number] 09/10/21 ?[id number]  ?NA 129* 133* 132*  ?K 4.8 4.9 5.5*  ?CL 85* 85* 87*  ?CO2 34* 39* 35*  ?GLUCOSE 208* 183* 289*  ?BUN 29* 28* 36*  ?CREATININE 0.59 0.88 1.29*  ?CALCIUM 10.3 10.2 10.0  ?MG  --  1.6* 2.2  ? ?Liver Function Tests: ?Recent Labs  ?Lab  10/03/2021 ?0908 09/09/21 ?[id number] 09/10/21 ?[id number]  ?AST 17 14* 13*  ?ALT 18 13 13   ?ALKPHOS 70 62 86  ?BILITOT 0.5 0.4 0.7  ?PROT 7.1 6.5 6.3*  ?ALBUMIN 3.0* 2.6* 2.6*  ? ?No results for input(s): LIPASE, AMYLASE in the last 168 hours. ?No results for input(s): AMMONIA in the last 168 hours. ?Coagulation Profile: ?No results for input(s): INR, PROTIME in the last 168 hours. ?CBC: ?Recent Labs  ?Lab 09/15/2021 ?0908 09/09/21 ?[id number] 09/10/21 ?[id number]  ?WBC 12.4* 12.9* 11.1*  ?NEUTROABS  --  10.5* 10.1*  ?HGB 10.7* 9.8* 9.7*  ?HCT 34.3* 33.3* 33.4*  ?MCV 82.5 86.3 86.3  ?PLT 396 378 377  ? ?Cardiac Enzymes: ?No results for input(s): CKTOTAL, CKMB, CKMBINDEX, TROPONINI in the last 168 hours. ?BNP: ?Invalid input(s): POCBNP ?CBG: ?Recent Labs  ?Lab 09/09/21 ?1655 09/09/21 ?2139 09/10/21 ?0319 09/10/21 ?[id number] 09/10/21 ?1114  ?GLUCAP 161* [place]  ? ?HbA1C: ?Recent Labs  ?  09/26/2021 ?0908  ?HGBA1C 7.7*  ? ?Urine analysis: ?   ?Component Value Date/Time  ? COLORURINE YELLOW 12/09/2013 0835  ? APPEARANCEUR HAZY (A) 12/09/2013 0835  ? LABSPEC 1.010 12/09/2013 0835  ? PHURINE  5.5 12/09/2013 0835  ? GLUCOSEU NEGATIVE 12/09/2013 0835  ? [place] NEGATIVE 12/09/2013 0835  ? [place] NEGATIVE 12/09/2013 0835  ? [place] NEGATIVE 12/09/2013 0835  ? [place] NEGATIVE 12/09/2013 0835  ? UROBILINOGEN 0.2 12/09/2013 0835  ? NITRITE NEGATIVE 12/09/2013 0835  ? LEUKOCYTESUR NEGATIVE 12/09/2013 0835  ? ?Sepsis Labs: ?@LABRCNTIP (procalcitonin:4,lacticidven:4) ?)No results found for this or any previous visit (from the past 240 hour(s)).  ? ?Scheduled Meds: ? arformoterol  15 mcg Nebulization BID  ? aspirin EC  81 mg Oral Daily  ? carvedilol  3.125 mg Oral BID  ? dextromethorphan-guaiFENesin  2 tablet Oral BID  ? enoxaparin (LOVENOX) injection  40 mg Subcutaneous Q24H  ? febuxostat  40 mg Oral Daily  ? insulin aspart  0-15 Units Subcutaneous TID WC  ? insulin aspart  0-5 Units Subcutaneous QHS  ? insulin glargine-yfgn  20 Units  Subcutaneous Daily  ? loratadine  10 mg Oral Daily  ? methylPREDNISolone (SOLU-MEDROL) injection  80 mg Intravenous Q12H  ? pantoprazole  40 mg Oral BID AC  ? Ensure Max Protein  11 oz Oral BID  ? simvastatin  20 mg Oral q1800

## 2021-09-10 NOTE — Assessment & Plan Note (Addendum)
Corrected calcium 11.1 ?-given IVF initially ?-intact PTH--30 ?-PTHrp--pending ?-25-vit D--38.25 ?-calcitriol level - 24.0 ?

## 2021-09-10 NOTE — Consult Note (Signed)
[facility] ?Oncology Progress Note ? ?Name: Kathryn Morgan      MRN: [id number]    Location: [id number]  Date: 09/10/2021 Time:6:48 PM ? ? ?Subjective: ?Interval History:Kathryn Morgan is seen this evening.  She is lying in the bed.  She did not get out of the bed today.  She feels weak and also has pain in the back. ? ?Objective: ?Vital signs in last 24 hours: ?Temp:  [97.4 ?F (36.3 ?C)-97.7 ?F (36.5 ?C)] 97.7 ?F (36.5 ?C) (04/06 1341) ?Pulse Rate:  [97-100] 97 (04/06 1341) ?Resp:  [16-18] 16 (04/06 1341) ?BP: (122-140)/(58-63) 140/60 (04/06 1341) ?SpO2:  [91 %-98 %] 91 % (04/06 1341) ?Weight:  [181 lb 14.1 oz (82.5 kg)] 181 lb 14.1 oz (82.5 kg) (04/06 0502)   ? ?Intake/Output from previous day: ?04/05 0800 - 04/06 0759 ?In: 350  ?Out: 900 [Urine:900]  ? ? ?Intake/Output this shift: ?Total I/O ?In: 720 [P.O.:720] ?Out: 300 [Urine:300] ? ? ?PHYSICAL EXAM: ?BP 140/60 (BP Location: Left Arm)   Pulse 97   Temp 97.7 ?F (36.5 ?C) (Oral)   Resp 16   Ht 5\' 3"  (1.6 m)   Wt 181 lb 14.1 oz (82.5 kg)   SpO2 91%   BMI 32.22 kg/m?  ?General appearance: alert, cooperative, and appears stated age ?Neurologic: Grossly normal ? ? ?Studies/Results: ?Results for orders placed or performed during the hospital encounter of 09/12/2021 (from the past 48 hour(s))  ?Glucose, capillary     Status: Abnormal  ? Collection Time: 09/25/2021 10:04 PM  ?Result Value Ref Range  ? Glucose-Capillary 223 (H) 70 - 99 mg/dL  ?  Comment: Glucose reference range applies only to samples taken after fasting for at least 8 hours.  ?Glucose, capillary     Status: Abnormal  ? Collection Time: 09/09/21  2:36 AM  ?Result Value Ref Range  ? Glucose-Capillary 205 (H) 70 - 99 mg/dL  ?  Comment: Glucose reference range applies only to samples taken after fasting for at least 8 hours.  ?Comprehensive metabolic panel     Status: Abnormal  ? Collection Time: 09/09/21  5:36 AM  ?Result Value Ref Range  ? Sodium 133 (L) 135 - 145 mmol/L  ? Potassium 4.9 3.5  - 5.1 mmol/L  ? Chloride 85 (L) 98 - 111 mmol/L  ? CO2 39 (H) 22 - 32 mmol/L  ? Glucose, Bld 183 (H) 70 - 99 mg/dL  ?  Comment: Glucose reference range applies only to samples taken after fasting for at least 8 hours.  ? BUN 28 (H) 8 - 23 mg/dL  ? Creatinine, Ser 0.88 0.44 - 1.00 mg/dL  ? Calcium 10.2 8.9 - 10.3 mg/dL  ? Total Protein 6.5 6.5 - 8.1 g/dL  ? Albumin 2.6 (L) 3.5 - 5.0 g/dL  ? AST 14 (L) 15 - 41 U/L  ? ALT 13 0 - 44 U/L  ? Alkaline Phosphatase 62 38 - 126 U/L  ? Total Bilirubin 0.4 0.3 - 1.2 mg/dL  ? GFR, Estimated >60 >60 mL/min  ?  Comment: (NOTE) ?Calculated using the CKD-EPI Creatinine Equation (2021) ?  ? Anion gap 9 5 - 15  ?  Comment: Performed at [facility], [address], [place], [place] [address]  ?Magnesium     Status: Abnormal  ? Collection Time: 09/09/21  5:36 AM  ?Result Value Ref Range  ? Magnesium 1.6 (L) 1.7 - 2.4 mg/dL  ?  Comment: Performed at [facility], [address], [place],  [place] [address]  ?CBC WITH DIFFERENTIAL     Status: Abnormal  ? Collection Time: 09/09/21  5:36 AM  ?Result Value Ref Range  ? WBC 12.9 (H) 4.0 - 10.5 K/uL  ? RBC 3.86 (L) 3.87 - 5.11 MIL/uL  ? Hemoglobin 9.8 (L) 12.0 - 15.0 g/dL  ? HCT 33.3 (L) 36.0 - 46.0 %  ? MCV 86.3 80.0 - 100.0 fL  ? MCH 25.4 (L) 26.0 - 34.0 pg  ? MCHC 29.4 (L) 30.0 - 36.0 g/dL  ? RDW 15.6 (H) 11.5 - 15.5 %  ? Platelets 378 150 - 400 K/uL  ? nRBC 0.0 0.0 - 0.2 %  ? Neutrophils Relative % 81 %  ? Neutro Abs 10.5 (H) 1.7 - 7.7 K/uL  ? Lymphocytes Relative 9 %  ? Lymphs Abs 1.1 0.7 - 4.0 K/uL  ? Monocytes Relative 8 %  ? Monocytes Absolute 1.0 0.1 - 1.0 K/uL  ? Eosinophils Relative 0 %  ? Eosinophils Absolute 0.0 0.0 - 0.5 K/uL  ? Basophils Relative 0 %  ? Basophils Absolute 0.0 0.0 - 0.1 K/uL  ? Immature Granulocytes 2 %  ? Abs Immature Granulocytes 0.20 (H) 0.00 - 0.07 K/uL  ?  Comment: Performed at [facility], [address] [place], [place] [address]  ?Glucose, capillary     Status: Abnormal  ? Collection Time: 09/09/21  8:13  AM  ?Result Value Ref Range  ? Glucose-Capillary 141 (H) 70 - 99 mg/dL  ?  Comment: Glucose reference range applies only to samples taken after fasting for at least 8 hours.  ? Comment 1 Notify RN   ? Comment 2 Document in Chart   ?D-dimer, quantitative     Status: Abnormal  ? Collection Time: 09/09/21  9:24 AM  ?Result Value Ref Range  ? D-Dimer, Quant 1.92 (H) 0.00 - 0.50 ug/mL-FEU  ?  Comment: (NOTE) ?At the manufacturer cut-off value of 0.5 ?g/mL FEU, this assay has a ?negative predictive value of 95-100%.This assay is intended for use ?in conjunction with a clinical pretest probability (PTP) assessment ?model to exclude pulmonary embolism (PE) and deep venous thrombosis ?(DVT) in outpatients suspected of PE or DVT. ?Results should be correlated with clinical presentation. ?Performed at [facility], [address], [place], [place] [address] ?  ?Procalcitonin - Baseline     Status: None  ? Collection Time: 09/09/21  9:24 AM  ?Result Value Ref Range  ? Procalcitonin 0.81 ng/mL  ?  Comment:        ?Interpretation: ?PCT > 0.5 ng/mL and <= 2 ng/mL: ?Systemic infection (sepsis) is possible, ?but other conditions are known to elevate ?PCT as well. ?(NOTE) ?      Sepsis PCT Algorithm           Lower Respiratory Tract ?                                     Infection PCT Algorithm ?   ----------------------------     ---------------------------- ?        PCT < 0.25 ng/mL                PCT < 0.10 ng/mL ? ?        Strongly encourage             Strongly discourage ?  discontinuation of antibiotics    initiation of antibiotics ?   ----------------------------     ----------------------------- ?  PCT 0.25 - 0.50 ng/mL            PCT 0.10 - 0.25 ng/mL ?              OR ?      >80% decrease in PCT            Discourage initiation of ?                                           antibiotics ?     Encourage discontinuation ?          of antibiotics ?   ----------------------------     ----------------------------- ?        PCT  >= 0.50 ng/mL              PCT 0.26 - 0.50 ng/mL ?               AND ?      <80% decrease in PCT             Encourage initiation of ?                                            antibiotics ?      Encourage continuation ?          of antibiotics ?   ----------------------------     ----------------------------- ?       PCT >= 0.50 ng/mL                  PCT > 0.50 ng/mL ?              AND ?        increase in PCT                  Strongly encourage ?                                     initiation of antibiotics ?   Strongly encourage escalation ?          of antibiotics ?                                    ----------------------------- ?                                          PCT <= 0.25 ng/mL ?                                                OR ?                                       > 80% decrease in PCT ? ?  Discontinue / Do not initiate ?                                            antibiotics ? ?Performed at [facility], [address] [place], [place] [address] ?  ?Iron and TIBC     Status: None  ? Collection Time: 09/09/21  9:24 AM  ?Result Value Ref Range  ? Iron 43 28 - 170 ug/dL  ? TIBC 261 250 - 450 ug/dL  ? Saturation Ratios 16 10.4 - 31.8 %  ? UIBC 218 ug/dL  ?  Comment: Performed at [facility], [address] [place], [place] [address]  ?Ferritin     Status: Abnormal  ? Collection Time: 09/09/21  9:24 AM  ?Result Value Ref Range  ? Ferritin 637 (H) 11 - 307 ng/mL  ?  Comment: Performed at [facility], [address] [place], [place] [address]  ?Glucose, capillary     Status: Abnormal  ? Collection Time: 09/09/21 11:14 AM  ?Result Value Ref Range  ? Glucose-Capillary 178 (H) 70 - 99 mg/dL  ?  Comment: Glucose reference range applies only to samples taken after fasting for at least 8 hours.  ? Comment 1 Notify RN   ? Comment 2 Document in Chart   ?Glucose, capillary     Status: Abnormal  ? Collection Time: 09/09/21  4:55 PM  ?Result Value Ref Range  ?  Glucose-Capillary 161 (H) 70 - 99 mg/dL  ?  Comment: Glucose reference range applies only to samples taken after fasting for at least 8 hours.  ?Glucose, capillary     Status: Abnormal  ? Collection Time: 09/09/21  9:39 PM

## 2021-09-10 NOTE — Assessment & Plan Note (Signed)
Started on nutritional supplements ?

## 2021-09-10 NOTE — Assessment & Plan Note (Addendum)
D/c ARB ?-improved with lokelma x 2 and lasix ?-am BMP ?

## 2021-09-11 DIAGNOSIS — N179 Acute kidney failure, unspecified: Secondary | ICD-10-CM

## 2021-09-11 DIAGNOSIS — R590 Localized enlarged lymph nodes: Secondary | ICD-10-CM | POA: Diagnosis not present

## 2021-09-11 DIAGNOSIS — J9621 Acute and chronic respiratory failure with hypoxia: Secondary | ICD-10-CM | POA: Diagnosis not present

## 2021-09-11 LAB — CBC WITH DIFFERENTIAL/PLATELET
Abs Immature Granulocytes: 0.32 10*3/uL — ABNORMAL HIGH (ref 0.00–0.07)
Basophils Absolute: 0 10*3/uL (ref 0.0–0.1)
Basophils Relative: 0 %
Eosinophils Absolute: 0 10*3/uL (ref 0.0–0.5)
Eosinophils Relative: 0 %
HCT: 31.9 % — ABNORMAL LOW (ref 36.0–46.0)
Hemoglobin: 9.5 g/dL — ABNORMAL LOW (ref 12.0–15.0)
Immature Granulocytes: 2 %
Lymphocytes Relative: 4 %
Lymphs Abs: 0.8 10*3/uL (ref 0.7–4.0)
MCH: 25.4 pg — ABNORMAL LOW (ref 26.0–34.0)
MCHC: 29.8 g/dL — ABNORMAL LOW (ref 30.0–36.0)
MCV: 85.3 fL (ref 80.0–100.0)
Monocytes Absolute: 0.8 10*3/uL (ref 0.1–1.0)
Monocytes Relative: 4 %
Neutro Abs: 15.3 10*3/uL — ABNORMAL HIGH (ref 1.7–7.7)
Neutrophils Relative %: 90 %
Platelets: 367 10*3/uL (ref 150–400)
RBC: 3.74 MIL/uL — ABNORMAL LOW (ref 3.87–5.11)
RDW: 16.1 % — ABNORMAL HIGH (ref 11.5–15.5)
WBC: 17.1 10*3/uL — ABNORMAL HIGH (ref 4.0–10.5)
nRBC: 0 % (ref 0.0–0.2)

## 2021-09-11 LAB — MAGNESIUM: Magnesium: 2.2 mg/dL (ref 1.7–2.4)

## 2021-09-11 LAB — COMPREHENSIVE METABOLIC PANEL
ALT: 13 U/L (ref 0–44)
AST: 13 U/L — ABNORMAL LOW (ref 15–41)
Albumin: 2.5 g/dL — ABNORMAL LOW (ref 3.5–5.0)
Alkaline Phosphatase: 79 U/L (ref 38–126)
Anion gap: 10 (ref 5–15)
BUN: 48 mg/dL — ABNORMAL HIGH (ref 8–23)
CO2: 34 mmol/L — ABNORMAL HIGH (ref 22–32)
Calcium: 9.8 mg/dL (ref 8.9–10.3)
Chloride: 87 mmol/L — ABNORMAL LOW (ref 98–111)
Creatinine, Ser: 1.62 mg/dL — ABNORMAL HIGH (ref 0.44–1.00)
GFR, Estimated: 32 mL/min — ABNORMAL LOW (ref >60)
Glucose, Bld: 228 mg/dL — ABNORMAL HIGH (ref 70–99)
Potassium: 5.7 mmol/L — ABNORMAL HIGH (ref 3.5–5.1)
Sodium: 131 mmol/L — ABNORMAL LOW (ref 135–145)
Total Bilirubin: 0.5 mg/dL (ref 0.3–1.2)
Total Protein: 6.1 g/dL — ABNORMAL LOW (ref 6.5–8.1)

## 2021-09-11 LAB — BASIC METABOLIC PANEL
Anion gap: 9 (ref 5–15)
BUN: 53 mg/dL — ABNORMAL HIGH (ref 8–23)
CO2: 34 mmol/L — ABNORMAL HIGH (ref 22–32)
Calcium: 9.7 mg/dL (ref 8.9–10.3)
Chloride: 87 mmol/L — ABNORMAL LOW (ref 98–111)
Creatinine, Ser: 1.68 mg/dL — ABNORMAL HIGH (ref 0.44–1.00)
GFR, Estimated: 31 mL/min — ABNORMAL LOW (ref >60)
Glucose, Bld: 202 mg/dL — ABNORMAL HIGH (ref 70–99)
Potassium: 5 mmol/L (ref 3.5–5.1)
Sodium: 130 mmol/L — ABNORMAL LOW (ref 135–145)

## 2021-09-11 LAB — GLUCOSE, CAPILLARY
Glucose-Capillary: 247 mg/dL — ABNORMAL HIGH (ref 70–99)
Glucose-Capillary: 197 mg/dL — ABNORMAL HIGH (ref 70–99)
Glucose-Capillary: 204 mg/dL — ABNORMAL HIGH (ref 70–99)
Glucose-Capillary: 243 mg/dL — ABNORMAL HIGH (ref 70–99)
Glucose-Capillary: 181 mg/dL — ABNORMAL HIGH (ref 70–99)
Glucose-Capillary: 172 mg/dL — ABNORMAL HIGH (ref 70–99)

## 2021-09-11 MED ORDER — SODIUM CHLORIDE 0.9 % IV SOLN: INTRAVENOUS | Status: DC

## 2021-09-11 MED ORDER — AZITHROMYCIN 250 MG PO TABS
250.0000 mg | ORAL_TABLET | Freq: Every day | ORAL | Status: AC
  Administered 2021-09-11 – 2021-09-12 (×2): 250 mg via ORAL
  Filled 2021-09-11 (×2): qty 1

## 2021-09-11 MED ORDER — SODIUM ZIRCONIUM CYCLOSILICATE 10 G PO PACK
10.0000 g | PACK | Freq: Once | ORAL | Status: AC
  Administered 2021-09-11: 10 g via ORAL
  Filled 2021-09-11: qty 1

## 2021-09-11 NOTE — Care Management Important Message (Signed)
Important Message ? ?Patient Details  ?Name: Kathryn Morgan ?MRN: [id number] ?Date of Birth: 01-Sep-1940 ? ? ?Medicare Important Message Given:  Yes ? ?Medicare IM reviewed with [name], spouse, via patient's room phone [phone]). Copy of Medicare IM sent both to home address on file and securely to email: [email or web address]. ? ? ?[name] ?09/11/2021, 11:02 AM ?

## 2021-09-11 NOTE — Plan of Care (Signed)
  Problem: Activity: Goal: Ability to tolerate increased activity will improve Outcome: Progressing   

## 2021-09-11 NOTE — Progress Notes (Addendum)
?  ?       ?PROGRESS NOTE ? ?Kathryn Morgan [id number] DOB: 06-25-1940 DOA: 09/24/2021 ?PCP: Pablo Lawrence, NP ? ?Brief History:  ?81 year old female former smoker with oxygen dependent COPD (3L), secondary cardiomyopathy, history of bioprosthetic aortic valve replacement, type 2 diabetes mellitus, history of necrotizing pancreatitis, status post partial pancreatectomy, osteoporosis, rheumatic fever, GERD, dyslipidemia, CKD presented to the emergency department with chest pain and shortness of breath symptoms.  She has had a difficult time for the past several months with an unexplained iron deficiency anemia.  She has been seen by hematology and has received an iron infusion and was due to have another iron infusion later in the week.  She is due to establish care with Dr. Delton Coombes 09/11/2021.  She reports unexplained weight loss. She has been dealing with a chronic dry mostly nonproductive cough for the past several weeks.  She saw her PCP and they felt that it could be an underlying pneumonia versus recurrent pulmonary edema.  She was treated with a short course of antibiotics recently.  She had no significant improvement or change from treatment.  She reports that she has had increasing malaise and generalized weakness now requiring assistance to get OOB.  She reports having to sit up in a recliner for the past several weeks to sleep due to orthopnea symptoms.  She has been taking her oral Lasix and Entresto regularly.  Of note she reports that she has not had a colonoscopy or upper endoscopy screening done. ? ?Pt was seen in ED and her CXR was concerning for a lung mass.  She was sent for CT chest. It showed bulky bilateral hilar, infrahilar, subcarinal, paratracheal adenopathy in the chest compatible with malignancy. Possibilities could include lymphoproliferative disease or lung cancer.  Dr. Delton Coombes was consulted and said he would see patient in hospital.  CT also showed findings that suggest  underlying lung infection and edema.  She was started on IV antibiotics for pneumonia and admission was requested for further management.    ? ? ?Assessment and Plan: ?Acute on chronic respiratory failure with hypoxia ([place]) ?-- pt at baseline is on 3L/min supplemental oxygen at home ?-- currently on 5L>>4L ?-- multifactorial including COPD, PNA, possible PE ?-- CT chest--bulky hilar LN, bilateral LL consolidation with question RLL mass; sm bilateral pleural effusions ?-check D-dimer 1.92 ?4/5 CTA chest--no PE; bulky mediastinal LN, bilateral LL consolidation, 5 cm LLL mass; lytic lesion to R-6th rib ? ?Lobar pneumonia ([place]) ?Continue ceftriaxone and azithro ?PCT 0.81 ? ?Hilar lymphadenopathy ?--follow up with oncology recommendations ?--pulmonary consult appreciated>>PET, then EBUS ?--appreciate med onc consult>>same plan  ? ?AKI (acute kidney injury) ([place]) ?Due to contrast nephropathy ?Baseline creatinine 0.6-0.9 ?Serum creatinine peaking 1.69 ?Anticipate plateau in next 24 hours ? ?COPD (chronic obstructive pulmonary disease) ([place]) ?--resume home bronchodilators (or hospital substitutions) ?--no symptoms of acute exacerbation at this time  ? ? ?Essential hypertension, benign ?--resumed coreg ? ?DM (diabetes mellitus), type 2 ([place]) ?-- reduced dose semglee ?-- novolog sliding scale ?4/4 A1C--7.7 ? ?Hyperkalemia ?D/c ARB ?-repeat lokelma x 1 ?-am BMP ? ?Hypercalcemia of malignancy ?Corrected calcium 11.1 ?-continue IVF ?-intact PTH ?-PTHrp ? ?Malnutrition of moderate degree ?Started on nutritional supplements ? ?Iron deficiency anemia ?--Pt has been receiving IV iron infusions and Hg up to 10.  ?-- reports never had colon cancer screening  ?-- Dr. Delton Coombes consult appreciated>>no infusion needed at this time ?--iron sat 16%, ferritin 637 ? ?Abnormal weight loss ?--concerning for malignancy ?--working up  as noted  ? ?S/P aortic valve replacement ?--bioprosthetic valve 2013 ?--04/06/21 Echo EF 55-60%, normal valve  function ? ?Secondary cardiomyopathy ([employer]) ?--Pt has been well managed medically with coreg, entresto, lasix ?--04/06/21  Echo with EF 55-60% with grade 1 DD and replaced aortic valve (October 2022) ?--pulm recommends d/c entresto ? ?Mixed hyperlipidemia ?--resume home statin therapy  ? ? ?Status is: Inpatient ?Remains inpatient appropriate because: severity of illness requiring IV abx, increase oxygen requirement ?  ?  ?  ?Family Communication:   daughter updated at bedside 4/7 ?  ?Consultants:  pulm, med onc, palliative ?  ?Code Status:  FULL  ?  ?DVT Prophylaxis: [place] Lovenox ?  ?  ?Procedures: ?As Listed in Progress Note Above ?  ?Antibiotics: ?Ceftriaxone 4/4>> ?Azithro 4/4>> ?  ? ? ? ? ? ? ? ? ? ?Subjective: ?Patient complains of chest congestion and cough.  Denies n/v/d, abd pain.  She has some back pain.  No headache. ? ?Objective: ?Vitals:  ? 09/11/21 0359 09/11/21 0500 09/11/21 0808 09/11/21 1257  ?BP: (!) 149/55   139/65  ?Pulse: (!) 103   100  ?Resp: 18   20  ?Temp: 98.3 ?F (36.8 ?C)   98.2 ?F (36.8 ?C)  ?TempSrc:      ?SpO2: 94%  91% 94%  ?Weight:  83.9 kg    ?Height:      ? ? ?Intake/Output Summary (Last 24 hours) at 09/11/2021 1704 ?Last data filed at 09/11/2021 1300 ?Gross per 24 hour  ?Intake 900 ml  ?Output 500 ml  ?Net 400 ml  ? ?Weight change: 1 kg ?Exam: ? ?General:  Pt is alert, follows commands appropriately, not in acute distress ?HEENT: No icterus, No thrush, No neck mass, [place]/AT ?Cardiovascular: RRR, S1/S2, no rubs, no gallops ?Respiratory: bibasilar rales. No wheeze ?Abdomen: Soft/+BS, non tender, non distended, no guarding ?Extremities: No edema, No lymphangitis, No petechiae, No rashes, no synovitis ? ? ?Data Reviewed: ?I have personally reviewed following labs and imaging studies ?Basic Metabolic Panel: ?Recent Labs  ?Lab 09/15/2021 ?0908 09/09/21 ?[id number] 09/10/21 ?[id number] 09/11/21 ?[id number] 09/11/21 ?1402  ?NA 129* 133* 132* 131* 130*  ?K 4.8 4.9 5.5* 5.7* 5.0  ?CL 85* 85* 87* 87* 87*  ?CO2 34* 39* 35*  34* 34*  ?GLUCOSE 208* 183* 289* 228* 202*  ?BUN 29* 28* 36* 48* 53*  ?CREATININE 0.59 0.88 1.29* 1.62* 1.68*  ?CALCIUM 10.3 10.2 10.0 9.8 9.7  ?MG  --  1.6* 2.2 2.2  --   ? ?Liver Function Tests: ?Recent Labs  ?Lab 09/13/2021 ?0908 09/09/21 ?[id number] 09/10/21 ?[id number] 09/11/21 ?[id number]  ?AST 17 14* 13* 13*  ?ALT 18 13 13 13   ?ALKPHOS 70 62 86 79  ?BILITOT 0.5 0.4 0.7 0.5  ?PROT 7.1 6.5 6.3* 6.1*  ?ALBUMIN 3.0* 2.6* 2.6* 2.5*  ? ?No results for input(s): LIPASE, AMYLASE in the last 168 hours. ?No results for input(s): AMMONIA in the last 168 hours. ?Coagulation Profile: ?No results for input(s): INR, PROTIME in the last 168 hours. ?CBC: ?Recent Labs  ?Lab 09/24/2021 ?0908 09/09/21 ?[id number] 09/10/21 ?[id number] 09/11/21 ?[id number]  ?WBC 12.4* 12.9* 11.1* 17.1*  ?NEUTROABS  --  10.5* 10.1* 15.3*  ?HGB 10.7* 9.8* 9.7* 9.5*  ?HCT 34.3* 33.3* 33.4* 31.9*  ?MCV 82.5 86.3 86.3 85.3  ?PLT 396 378 377 367  ? ?Cardiac Enzymes: ?No results for input(s): CKTOTAL, CKMB, CKMBINDEX, TROPONINI in the last 168 hours. ?BNP: ?Invalid input(s): POCBNP ?CBG: ?Recent Labs  ?Lab 09/10/21 ?2147 09/11/21 ?0355 09/11/21 ?0726 09/11/21 ?  1111 09/11/21 ?1620  ?GLUCAP 200* 197* 204* 243* 181*  ? ?HbA1C: ?No results for input(s): HGBA1C in the last 72 hours. ?Urine analysis: ?   ?Component Value Date/Time  ? COLORURINE YELLOW 12/09/2013 0835  ? APPEARANCEUR HAZY (A) 12/09/2013 0835  ? LABSPEC 1.010 12/09/2013 0835  ? PHURINE 5.5 12/09/2013 0835  ? GLUCOSEU NEGATIVE 12/09/2013 0835  ? [place] NEGATIVE 12/09/2013 0835  ? [place] NEGATIVE 12/09/2013 0835  ? [place] NEGATIVE 12/09/2013 0835  ? [place] NEGATIVE 12/09/2013 0835  ? UROBILINOGEN 0.2 12/09/2013 0835  ? NITRITE NEGATIVE 12/09/2013 0835  ? LEUKOCYTESUR NEGATIVE 12/09/2013 0835  ? ?Sepsis Labs: ?@LABRCNTIP (procalcitonin:4,lacticidven:4) ?)No results found for this or any previous visit (from the past 240 hour(s)).  ? ?Scheduled Meds: ? arformoterol  15 mcg Nebulization BID  ? aspirin EC  81 mg Oral Daily  ?  azithromycin  250 mg Oral Daily  ? carvedilol  3.125 mg Oral BID  ? dextromethorphan-guaiFENesin  2 tablet Oral BID  ? enoxaparin (LOVENOX) injection  40 mg Subcutaneous Q24H  ? febuxostat  40 mg Oral Daily  ? insul

## 2021-09-11 NOTE — Plan of Care (Signed)
?  Problem: Acute Rehab PT Goals(only PT should resolve) ?Goal: Pt Will Go Supine/Side To Sit ?Outcome: Progressing ?Flowsheets (Taken 09/11/2021 1358) ?Pt will go Supine/Side to Sit: ? with supervision ? with min guard assist ?Goal: Patient Will Transfer Sit To/From Stand ?Outcome: Progressing ?Flowsheets (Taken 09/11/2021 1358) ?Patient will transfer sit to/from stand: ? with min guard assist ? with minimal assist ?Goal: Pt Will Transfer Bed To Chair/Chair To Bed ?Outcome: Progressing ?Flowsheets (Taken 09/11/2021 1358) ?Pt will Transfer Bed to Chair/Chair to Bed: ? min guard assist ? with min assist ?Goal: Pt Will Ambulate ?Outcome: Progressing ?Flowsheets (Taken 09/11/2021 1358) ?Pt will Ambulate: ? with minimal assist ? with moderate assist ? with rolling walker ? 25 feet ?  ?1:59 PM, 09/11/21 ?Ocie Bob, MPT ?Physical Therapist with [place] ?[facility] ?[phone] office ?[phone] mobile phone ? ?

## 2021-09-11 NOTE — Assessment & Plan Note (Addendum)
Due to contrast nephropathy ?Baseline creatinine 0.6-0.9 ?Serum creatinine peaking 1.69 ?4/8--serum creatinine down to 1.40 ?4/10--overall improving to 1.01 ?

## 2021-09-11 NOTE — Evaluation (Signed)
Physical Therapy Evaluation ?Patient Details ?Name: Kathryn Morgan ?MRN: [id number] ?DOB: 12/14/1940 ?Today's Date: 09/11/2021 ? ?History of Present Illness ? Kathryn Morgan is a 81 year old female former smoker with oxygen dependent COPD, secondary cardiomyopathy, history of aortic valve replacement, type 2 diabetes mellitus, history of necrotizing pancreatitis, status post partial pancreatectomy, osteoporosis, rheumatic fever, GERD, dyslipidemia, CKD presented to the emergency department with chest pain and shortness of breath symptoms.  She has had a difficult time for the past several months with an unexplained iron deficiency anemia.  She has been seen by hematology and has received an iron infusion and was due to have another iron infusion later in the week.  She is due to establish care with Dr. Ellin Saba earlier today.  She reports unexplained weight loss.  She also reports having a lot of GI symptoms in the last 2 weeks with increased flatulence and irregular bowel habits.  She has been dealing with a chronic dry mostly nonproductive cough for the past several weeks.  She saw her PCP and they felt that it could be an underlying pneumonia versus recurrent pulmonary edema.  She was treated with a short course of antibiotics.  She had no significant improvement or change from treatment.  She reports that she has had increasing malaise and weakness and not able to sleep at night due to difficulty breathing.  She reports having to sit up in a recliner for the past several weeks.  She reports peripheral edema.  She has been taking her oral Lasix and Entresto regularly.  She was seen by her nephrologist Dr. Wolfgang Phoenix and being worked up for this as well.  Of note she reports that she has not had a colonoscopy or upper endoscopy screening done.     Pt was seen in ED and her CXR was concerning for a lung mass.  She was sent for CT chest. It showed bulky bilateral hilar, infrahilar, subcarinal, paratracheal adenopathy  in the chest compatible with malignancy. Possibilities could include lymphoproliferative disease or lung cancer.  Dr. Ellin Saba was consulted and said he would see patient in hospital ?  ?Clinical Impression ? Patient demonstrates slow slightly labored movement for sitting up at bedside, once seated requires frequent rest break in between functional tasks, limited to a few side steps and steps forward/backwards at bedside before having to sit due to generalized weakness and c/o fatigue.  Patient tolerated sitting up in chair after therapy with her spouse present in room.  Patient will benefit from continued skilled physical therapy in hospital and recommended venue below to increase strength, balance, endurance for safe ADLs and gait.  ? ?   ?   ? ?Recommendations for follow up therapy are one component of a multi-disciplinary discharge planning process, led by the attending physician.  Recommendations may be updated based on patient status, additional functional criteria and insurance authorization. ? ?Follow Up Recommendations Skilled nursing-short term rehab (<3 hours/day) ? ?  ?Assistance Recommended at Discharge Set up Supervision/Assistance  ?Patient can return home with the following ? A lot of help with walking and/or transfers;A lot of help with bathing/dressing/bathroom;Assistance with cooking/housework;Help with stairs or ramp for entrance;Assist for transportation ? ?  ?Equipment Recommendations None recommended by PT  ?Recommendations for Other Services ?    ?  ?Functional Status Assessment Patient has had a recent decline in their functional status and demonstrates the ability to make significant improvements in function in a reasonable and predictable amount of time.  ? ?  ?Precautions /  Restrictions Precautions ?Precautions: Fall ?Restrictions ?Weight Bearing Restrictions: No  ? ?  ? ?Mobility ? Bed Mobility ?Overal bed mobility: Needs Assistance ?Bed Mobility: Supine to Sit ?  ?  ?Supine to sit:  Min assist ?  ?  ?General bed mobility comments: increased time, labored movement ?  ? ?Transfers ?Overall transfer level: Needs assistance ?Equipment used: Rolling walker (2 wheels) ?Transfers: Sit to/from Stand, Bed to chair/wheelchair/BSC ?Sit to Stand: Min assist ?  ?Step pivot transfers: Min assist, Mod assist ?  ?  ?  ?General transfer comment: unsteady labored movement ?  ? ?Ambulation/Gait ?Ambulation/Gait assistance: Mod assist ?Gait Distance (Feet): 8 Feet ?Assistive device: Rolling walker (2 wheels) ?Gait Pattern/deviations: Decreased step length - right, Decreased step length - left, Decreased stride length ?Gait velocity: decreased ?  ?  ?General Gait Details: limited to a few slow labored side steps and steps forward/backwards before having to sit due to c/o fatigue and poor standing balance ? ?Stairs ?  ?  ?  ?  ?  ? ?Wheelchair Mobility ?  ? ?Modified Rankin (Stroke Patients Only) ?  ? ?  ? ?Balance Overall balance assessment: Needs assistance ?Sitting-balance support: Feet supported, No upper extremity supported ?Sitting balance-Leahy Scale: Fair ?Sitting balance - Comments: fair/good seated at EOB ?  ?Standing balance support: During functional activity, Bilateral upper extremity supported ?Standing balance-Leahy Scale: Poor ?Standing balance comment: fair/poor using RW ?  ?  ?  ?  ?  ?  ?  ?  ?  ?  ?  ?   ? ? ? ?Pertinent Vitals/Pain Pain Assessment ?Pain Assessment: 0-10 ?Pain Score: 8  ?Pain Location: right hip, low back ?Pain Descriptors / Indicators: Sore, Aching ?Pain Intervention(s): Limited activity within patient's tolerance, Monitored during session, Repositioned  ? ? ?Home Living Family/patient expects to be discharged to:: Private residence ?Living Arrangements: Spouse/significant other ?Available Help at Discharge: Family;Available 24 hours/day ?Type of Home: House ?Home Access: Stairs to enter ?Entrance Stairs-Rails: Right;Left (to wide to reach both) ?Entrance Stairs-Number of Steps:  2 ?  ?Home Layout: One level ?Home Equipment: Agricultural consultant (2 wheels);Cane - single point;Wheelchair - manual;Grab bars - tub/shower ?   ?  ?Prior Function Prior Level of Function : Needs assist ?  ?  ?  ?Physical Assist : Mobility (physical);ADLs (physical) ?Mobility (physical): Bed mobility;Transfers;Gait;Stairs ?  ?Mobility Comments: Assisted for transfers for last 3 weeks, prior to 3-4 weeks ago was able to ambulate without AD household and short distanced community, on 3.5 LPM O2 at home (recently started 4 weeks ago) ?ADLs Comments: assisted by family ?  ? ? ?Hand Dominance  ?   ? ?  ?Extremity/Trunk Assessment  ? Upper Extremity Assessment ?Upper Extremity Assessment: Generalized weakness ?  ? ?Lower Extremity Assessment ?Lower Extremity Assessment: Generalized weakness ?  ? ?Cervical / Trunk Assessment ?Cervical / Trunk Assessment: Normal  ?Communication  ? Communication: No difficulties  ?Cognition Arousal/Alertness: Awake/alert ?Behavior During Therapy: [facility] for tasks assessed/performed ?Overall Cognitive Status: Within Functional Limits for tasks assessed ?  ?  ?  ?  ?  ?  ?  ?  ?  ?  ?  ?  ?  ?  ?  ?  ?  ?  ?  ? ?  ?General Comments   ? ?  ?Exercises    ? ?Assessment/Plan  ?  ?PT Assessment Patient needs continued PT services  ?PT Problem List Decreased strength;Decreased activity tolerance;Decreased balance;Decreased mobility ? ?   ?  ?PT  Treatment Interventions DME instruction;Gait training;Stair training;Functional mobility training;Therapeutic activities;Therapeutic exercise;Patient/family education;Balance training   ? ?PT Goals (Current goals can be found in the Care Plan section)  ?Acute Rehab PT Goals ?Patient Stated Goal: return home after rehab ?PT Goal Formulation: With patient/family ?Time For Goal Achievement: 09/25/21 ?Potential to Achieve Goals: Good ? ?  ?Frequency Min 3X/week ?  ? ? ?Co-evaluation   ?  ?  ?  ?  ? ? ?  ?AM-PAC PT "6 Clicks" Mobility  ?Outcome Measure Help needed turning  from your back to your side while in a flat bed without using bedrails?: A Little ?Help needed moving from lying on your back to sitting on the side of a flat bed without using bedrails?: A Little ?Hel

## 2021-09-11 NOTE — Clinical Note (Incomplete)
?  Transition of Care (TOC) Screening Note ? ? ?Patient Details  ?Name: Kathryn Morgan ?Date of Birth: Dec 15, 1940 ? ? ?Transition of Care (TOC) CM/SW Contact:    ?Tylek Boney D, LCSW ?Phone Number: ?09/11/2021, 12:47 PM ? ? ? ?Transition of Care Department [facility]) has reviewed patient and no TOC needs have been identified at this time. We will continue to monitor patient advancement through interdisciplinary progression rounds. If new patient transition needs arise, please place a TOC consult. ?  ?

## 2021-09-11 NOTE — Progress Notes (Addendum)
Inpatient Diabetes Program Recommendations ? ?AACE/ADA: New Consensus Statement on Inpatient Glycemic Control  ? ?Target Ranges:  Prepandial:   less than 140 mg/dL ?     Peak postprandial:   less than 180 mg/dL (1-2 hours) ?     Critically ill patients:  140 - 180 mg/dL  ? ? ? Latest Reference Range & Units 09/10/21 07:23 09/10/21 11:14 09/10/21 16:03 09/10/21 21:47 09/11/21 03:55 09/11/21 07:26  ?Glucose-Capillary 70 - 99 mg/dL 650 (H) 354 (H) 656 (H) 200 (H) 197 (H) 204 (H)  ? ?Review of Glycemic Control ? ?Diabetes history: DM2 ?Outpatient Diabetes medications: Toujeo 28 units daily, Humalog 6-8 units TID as needed, Metformin 1000 mg daily ?Current orders for Inpatient glycemic control: Semglee 20 units Daily, Novolog 0-15 units TID with meals, Novolog 0-5 units QHS ? ?Solumedrol 80 mg Q12 hours ? ?Inpatient Diabetes Program Recommendations:   ? ?If steroid dose remains the same: ? ?-   Consider increasing Semglee to 25 units  ? ?Thanks, ?Christena Deem RN, MSN, BC-ADM ?Inpatient Diabetes Coordinator ?Team Pager [phone] (8a-5p) ?

## 2021-09-12 ENCOUNTER — Inpatient Hospital Stay (HOSPITAL_COMMUNITY): Payer: Medicare Other

## 2021-09-12 DIAGNOSIS — N179 Acute kidney failure, unspecified: Secondary | ICD-10-CM | POA: Diagnosis not present

## 2021-09-12 DIAGNOSIS — J9621 Acute and chronic respiratory failure with hypoxia: Secondary | ICD-10-CM | POA: Diagnosis not present

## 2021-09-12 DIAGNOSIS — R590 Localized enlarged lymph nodes: Secondary | ICD-10-CM | POA: Diagnosis not present

## 2021-09-12 DIAGNOSIS — I10 Essential (primary) hypertension: Secondary | ICD-10-CM | POA: Diagnosis not present

## 2021-09-12 LAB — COMPREHENSIVE METABOLIC PANEL
ALT: 13 U/L (ref 0–44)
AST: 14 U/L — ABNORMAL LOW (ref 15–41)
Albumin: 2.4 g/dL — ABNORMAL LOW (ref 3.5–5.0)
Alkaline Phosphatase: 76 U/L (ref 38–126)
Anion gap: 11 (ref 5–15)
BUN: 56 mg/dL — ABNORMAL HIGH (ref 8–23)
CO2: 33 mmol/L — ABNORMAL HIGH (ref 22–32)
Calcium: 10.1 mg/dL (ref 8.9–10.3)
Chloride: 90 mmol/L — ABNORMAL LOW (ref 98–111)
Creatinine, Ser: 1.4 mg/dL — ABNORMAL HIGH (ref 0.44–1.00)
GFR, Estimated: 38 mL/min — ABNORMAL LOW (ref >60)
Glucose, Bld: 175 mg/dL — ABNORMAL HIGH (ref 70–99)
Potassium: 5 mmol/L (ref 3.5–5.1)
Sodium: 134 mmol/L — ABNORMAL LOW (ref 135–145)
Total Bilirubin: 0.4 mg/dL (ref 0.3–1.2)
Total Protein: 6.2 g/dL — ABNORMAL LOW (ref 6.5–8.1)

## 2021-09-12 LAB — GLUCOSE, CAPILLARY
Glucose-Capillary: 167 mg/dL — ABNORMAL HIGH (ref 70–99)
Glucose-Capillary: 169 mg/dL — ABNORMAL HIGH (ref 70–99)
Glucose-Capillary: 202 mg/dL — ABNORMAL HIGH (ref 70–99)
Glucose-Capillary: 191 mg/dL — ABNORMAL HIGH (ref 70–99)
Glucose-Capillary: 182 mg/dL — ABNORMAL HIGH (ref 70–99)

## 2021-09-12 LAB — MAGNESIUM: Magnesium: 2.3 mg/dL (ref 1.7–2.4)

## 2021-09-12 LAB — PROCALCITONIN: Procalcitonin: 1.57 ng/mL

## 2021-09-12 LAB — VITAMIN D 25 HYDROXY (VIT D DEFICIENCY, FRACTURES): Vit D, 25-Hydroxy: 38.25 ng/mL (ref 30–100)

## 2021-09-12 MED ORDER — CARVEDILOL 3.125 MG PO TABS
6.2500 mg | ORAL_TABLET | Freq: Two times a day (BID) | ORAL | Status: DC
  Administered 2021-09-12 – 2021-09-15 (×7): 6.25 mg via ORAL
  Filled 2021-09-12 (×7): qty 2

## 2021-09-12 MED ORDER — LEVALBUTEROL HCL 0.63 MG/3ML IN NEBU
0.6300 mg | INHALATION_SOLUTION | Freq: Four times a day (QID) | RESPIRATORY_TRACT | Status: DC
Start: 2021-09-12 — End: 2021-09-14
  Administered 2021-09-12 – 2021-09-14 (×6): 0.63 mg via RESPIRATORY_TRACT
  Filled 2021-09-12 (×7): qty 3

## 2021-09-12 MED ORDER — BUDESONIDE 0.5 MG/2ML IN SUSP
0.5000 mg | Freq: Two times a day (BID) | RESPIRATORY_TRACT | Status: DC
  Administered 2021-09-12 – 2021-09-14 (×5): 0.5 mg via RESPIRATORY_TRACT
  Filled 2021-09-12 (×8): qty 2

## 2021-09-12 MED ORDER — REVEFENACIN 175 MCG/3ML IN SOLN
175.0000 ug | Freq: Every day | RESPIRATORY_TRACT | Status: DC
  Administered 2021-09-13 – 2021-09-14 (×2): 175 ug via RESPIRATORY_TRACT
  Filled 2021-09-12 (×3): qty 3

## 2021-09-12 NOTE — Progress Notes (Signed)
?  ?       ?PROGRESS NOTE ? ?Kathryn Morgan [id number] DOB: 12-27-1940 DOA: 09/09/2021 ?PCP: Pablo Lawrence, NP ? ?Brief History:  ?81 year old female former smoker with oxygen dependent COPD (3L), secondary cardiomyopathy, history of bioprosthetic aortic valve replacement, type 2 diabetes mellitus, history of necrotizing pancreatitis, status post partial pancreatectomy, osteoporosis, rheumatic fever, GERD, dyslipidemia, CKD presented to the emergency department with chest pain and shortness of breath symptoms.  She has had a difficult time for the past several months with an unexplained iron deficiency anemia.  She has been seen by hematology and has received an iron infusion and was due to have another iron infusion later in the week.  She is due to establish care with Dr. Delton Coombes 09/28/2021.  She reports unexplained weight loss. She has been dealing with a chronic dry mostly nonproductive cough for the past several weeks.  She saw her PCP and they felt that it could be an underlying pneumonia versus recurrent pulmonary edema.  She was treated with a short course of antibiotics recently.  She had no significant improvement or change from treatment.  She reports that she has had increasing malaise and generalized weakness now requiring assistance to get OOB.  She reports having to sit up in a recliner for the past several weeks to sleep due to orthopnea symptoms.  She has been taking her oral Lasix and Entresto regularly.  Of note she reports that she has not had a colonoscopy or upper endoscopy screening done. ? ?Pt was seen in ED and her CXR was concerning for a lung mass.  She was sent for CT chest. It showed bulky bilateral hilar, infrahilar, subcarinal, paratracheal adenopathy in the chest compatible with malignancy. Possibilities could include lymphoproliferative disease or lung cancer.  Dr. Delton Coombes was consulted and said he would see patient in hospital.  CT also showed findings that suggest  underlying lung infection and edema.  She was started on IV antibiotics for pneumonia and admission was requested for further management.   ? ?09/12/21:  pt had some sob this am.  I personally obtained VS:  T98.1 (oral)--114-22-161/76--91-92% on 4L.  Slept well overnight.  Denies f/,c cp, n/v/d. Hemoptysis.  Complains of trouble bring up sputum.  ? ? ?Assessment and Plan: ?Acute on chronic respiratory failure with hypoxia ([place]) ?-- pt at baseline is on 3L/min supplemental oxygen at home ?-- currently on 5L ?-- multifactorial including COPD, PNA ?-- CT chest--bulky hilar LN, bilateral LL consolidation with question RLL mass; sm bilateral pleural effusions ?-check D-dimer 1.92 ?4/5 CTA chest--no PE; bulky mediastinal LN, bilateral LL consolidation, 5 cm LLL mass; lytic lesion to R-6th rib ?-add Yupelri and pulmicort ?-hypertonic saline if still difficulty bringing up sputum ?4/8--repeat CXR ? ?Lobar pneumonia ([place]) ?Continue ceftriaxone and azithro ?PCT 0.81 ? ?Hilar lymphadenopathy ?--follow up with oncology recommendations ?--pulmonary consult appreciated>>PET, then EBUS ?--appreciate med onc consult>>same plan  ? ?AKI (acute kidney injury) ([place]) ?Due to contrast nephropathy ?Baseline creatinine 0.6-0.9 ?Serum creatinine peaking 1.69 ?4/8--serum creatinine down to 1.40 ?Saline lock IVF ? ?COPD (chronic obstructive pulmonary disease) ([place]) ?--resume home bronchodilators (or hospital substitutions) ?--add Yulperi, pulmicort ? ? ?Essential hypertension, benign ?--resumed coreg ? ?DM (diabetes mellitus), type 2 ([place]) ?-- reduced dose semglee ?-- novolog sliding scale ?4/4 A1C--7.7 ?CBGs elevated due to steroids ? ?Hyperkalemia ?D/c ARB ?-repeat lokelma x 1 ?-am BMP ? ?Hypercalcemia of malignancy ?Corrected calcium 11.1 ?-continue IVF ?-intact PTH ?-PTHrp ? ?Malnutrition of moderate degree ?Started on nutritional supplements ? ?  Iron deficiency anemia ?--Pt has been receiving IV iron infusions and Hg up to 10.  ?-- reports  never had colon cancer screening  ?-- Dr. Delton Coombes consult appreciated>>no infusion needed at this time ?--iron sat 16%, ferritin 637 ? ?Abnormal weight loss ?--concerning for malignancy ?--working up as noted  ? ?S/P aortic valve replacement ?--bioprosthetic valve 2013 ?--04/06/21 Echo EF 55-60%, normal valve function ? ?Secondary cardiomyopathy ([place]) ?--Pt has been well managed medically with coreg, entresto, lasix ?--04/06/21  Echo with EF 55-60% with grade 1 DD and replaced aortic valve (October 2022) ?--pulm recommends d/c entresto ? ?Mixed hyperlipidemia ?--resume home statin therapy  ? ? ? ? ? ? ? ? ?Status is: Inpatient ?Remains inpatient appropriate because: severity of illness requiring IV abx and IVF ? ? ? ?Family Communication:   spouse updated at bedside 4/8 ? ?Consultants:  pulm, palliative ? ?Code Status:  FULL ? ?DVT Prophylaxis:   Lovenox ? ? ?Procedures: ?As Listed in Progress Note Above ? ?Antibiotics: ?Ceftriaxone 4/4>> ?Azithro 4/4>> ? ? ? ? ? ?Subjective: ?pt had some sob this am.  I personally obtained VS:  T98.1 (oral)--114-22-161/76--91-92% on 4L.  Slept well overnight.  Denies f/,c cp, n/v/d. Hemoptysis.  Complains of trouble bring up sputum. ? ?Objective: ?Vitals:  ? 09/12/21 0359 09/12/21 0500 09/12/21 0740 09/12/21 0744  ?BP: (!) 160/77     ?Pulse: (!) 109     ?Resp: 19     ?Temp: 97.9 ?F (36.6 ?C)     ?TempSrc: Oral     ?SpO2: 93%  95% 99%  ?Weight:  85.9 kg    ?Height:      ? ? ?Intake/Output Summary (Last 24 hours) at 09/12/2021 0831 ?Last data filed at 09/12/2021 0600 ?Gross per 24 hour  ?Intake 2274.62 ml  ?Output 1050 ml  ?Net 1224.62 ml  ? ?Weight change: 2 kg ?Exam: ? ?General:  Pt is alert, follows commands appropriately, not in acute distress ?HEENT: No icterus, No thrush, No neck mass, [place]/AT ?Cardiovascular: RRR, S1/S2, no rubs, no gallops ?Respiratory: bibasilar rales.  Diminished BS L>R ?Abdomen: Soft/+BS, non tender, non distended, no guarding ?Extremities: No edema, No  lymphangitis, No petechiae, No rashes, no synovitis ? ? ?Data Reviewed: ?I have personally reviewed following labs and imaging studies ?Basic Metabolic Panel: ?Recent Labs  ?Lab 09/09/21 ?[id number] 09/10/21 ?[id number] 09/11/21 ?[id number] 09/11/21 ?1402 09/12/21 ?0440  ?NA 133* 132* 131* 130* 134*  ?K 4.9 5.5* 5.7* 5.0 5.0  ?CL 85* 87* 87* 87* 90*  ?CO2 39* 35* 34* 34* 33*  ?GLUCOSE 183* 289* 228* 202* 175*  ?BUN 28* 36* 48* 53* 56*  ?CREATININE 0.88 1.29* 1.62* 1.68* 1.40*  ?CALCIUM 10.2 10.0 9.8 9.7 10.1  ?MG 1.6* 2.2 2.2  --  2.3  ? ?Liver Function Tests: ?Recent Labs  ?Lab 10/01/2021 ?0908 09/09/21 ?[id number] 09/10/21 ?[id number] 09/11/21 ?[id number] 09/12/21 ?0440  ?AST 17 14* 13* 13* 14*  ?ALT 18 13 13 13 13   ?ALKPHOS 70 62 86 79 76  ?BILITOT 0.5 0.4 0.7 0.5 0.4  ?PROT 7.1 6.5 6.3* 6.1* 6.2*  ?ALBUMIN 3.0* 2.6* 2.6* 2.5* 2.4*  ? ?No results for input(s): LIPASE, AMYLASE in the last 168 hours. ?No results for input(s): AMMONIA in the last 168 hours. ?Coagulation Profile: ?No results for input(s): INR, PROTIME in the last 168 hours. ?CBC: ?Recent Labs  ?Lab 09/25/2021 ?0908 09/09/21 ?[id number] 09/10/21 ?[id number] 09/11/21 ?[id number]  ?WBC 12.4* 12.9* 11.1* 17.1*  ?NEUTROABS  --  10.5* 10.1* 15.3*  ?HGB 10.7* 9.8*  9.7* 9.5*  ?HCT 34.3* 33.3* 33.4* 31.9*  ?MCV 82.5 86.3 86.3 85.3  ?PLT 396 378 377 367  ? ?Cardiac Enzymes: ?No results for input(s): CKTOTAL, CKMB, CKMBINDEX, TROPONINI in the last 168 hours. ?BNP: ?Invalid input(s): POCBNP ?CBG: ?Recent Labs  ?Lab 09/11/21 ?1111 09/11/21 ?1620 09/11/21 ?2229 09/12/21 ?[id number] 09/12/21 ?[id number]  ?GLUCAP 243* 181* 172* 167* 169*  ? ?HbA1C: ?No results for input(s): HGBA1C in the last 72 hours. ?Urine analysis: ?   ?Component Value Date/Time  ? COLORURINE YELLOW 12/09/2013 0835  ? APPEARANCEUR HAZY (A) 12/09/2013 0835  ? LABSPEC 1.010 12/09/2013 0835  ? PHURINE 5.5 12/09/2013 0835  ? GLUCOSEU NEGATIVE 12/09/2013 0835  ? [place] NEGATIVE 12/09/2013 0835  ? [place] NEGATIVE 12/09/2013 0835  ? [place] NEGATIVE 12/09/2013 0835   ? [place] NEGATIVE 12/09/2013 0835  ? UROBILINOGEN 0.2 12/09/2013 0835  ? NITRITE NEGATIVE 12/09/2013 0835  ? LEUKOCYTESUR NEGATIVE 12/09/2013 0835  ? ?Sepsis Labs: ?[email or web address] (procalcitonin:4,lacticidven:4)

## 2021-09-13 DIAGNOSIS — N179 Acute kidney failure, unspecified: Secondary | ICD-10-CM | POA: Diagnosis not present

## 2021-09-13 DIAGNOSIS — J9621 Acute and chronic respiratory failure with hypoxia: Secondary | ICD-10-CM | POA: Diagnosis not present

## 2021-09-13 DIAGNOSIS — R590 Localized enlarged lymph nodes: Secondary | ICD-10-CM | POA: Diagnosis not present

## 2021-09-13 LAB — BASIC METABOLIC PANEL
Anion gap: 7 (ref 5–15)
BUN: 58 mg/dL — ABNORMAL HIGH (ref 8–23)
CO2: 35 mmol/L — ABNORMAL HIGH (ref 22–32)
Calcium: 9.7 mg/dL (ref 8.9–10.3)
Chloride: 94 mmol/L — ABNORMAL LOW (ref 98–111)
Creatinine, Ser: 1.23 mg/dL — ABNORMAL HIGH (ref 0.44–1.00)
GFR, Estimated: 45 mL/min — ABNORMAL LOW (ref >60)
Glucose, Bld: 179 mg/dL — ABNORMAL HIGH (ref 70–99)
Potassium: 4.8 mmol/L (ref 3.5–5.1)
Sodium: 136 mmol/L (ref 135–145)

## 2021-09-13 LAB — GLUCOSE, CAPILLARY
Glucose-Capillary: 189 mg/dL — ABNORMAL HIGH (ref 70–99)
Glucose-Capillary: 180 mg/dL — ABNORMAL HIGH (ref 70–99)
Glucose-Capillary: 230 mg/dL — ABNORMAL HIGH (ref 70–99)
Glucose-Capillary: 187 mg/dL — ABNORMAL HIGH (ref 70–99)
Glucose-Capillary: 205 mg/dL — ABNORMAL HIGH (ref 70–99)

## 2021-09-13 LAB — PHOSPHORUS: Phosphorus: 4 mg/dL (ref 2.5–4.6)

## 2021-09-13 LAB — METHYLMALONIC ACID, SERUM: Methylmalonic Acid, Quantitative: 628 nmol/L — ABNORMAL HIGH (ref 0–378)

## 2021-09-13 LAB — BRAIN NATRIURETIC PEPTIDE: B Natriuretic Peptide: 355 pg/mL — ABNORMAL HIGH (ref 0.0–100.0)

## 2021-09-13 MED ORDER — PREDNISONE 20 MG PO TABS
60.0000 mg | ORAL_TABLET | Freq: Every day | ORAL | Status: DC
  Administered 2021-09-14 – 2021-09-15 (×2): 60 mg via ORAL
  Filled 2021-09-13 (×2): qty 3

## 2021-09-13 MED ORDER — AZITHROMYCIN 250 MG PO TABS
250.0000 mg | ORAL_TABLET | Freq: Every day | ORAL | Status: DC
  Administered 2021-09-13 – 2021-09-15 (×3): 250 mg via ORAL
  Filled 2021-09-13 (×3): qty 1

## 2021-09-13 MED ORDER — CEFDINIR 300 MG PO CAPS
300.0000 mg | ORAL_CAPSULE | Freq: Two times a day (BID) | ORAL | Status: DC
Start: 2021-09-13 — End: 2021-09-15
  Administered 2021-09-13 – 2021-09-15 (×4): 300 mg via ORAL
  Filled 2021-09-13 (×4): qty 1

## 2021-09-13 NOTE — TOC Progression Note (Signed)
Transition of Care (TOC) - Progression Note  ? ? ?Patient Details  ?Name: Kathryn Morgan ?MRN: [id number] ?Date of Birth: Mar 30, 1941 ? ?Transition of Care (TOC) CM/SW Contact  ?Hetty Ely, RN ?Phone Number: ?09/13/2021, 2:44 PM ? ?Clinical Narrative: Hospital Bed with [facility]. Ordered for discharge tomorrow. Jasmine from [facility] will contact family to arrange.   ? ? ? ?  ?  ? ?Expected Discharge Plan and Services ?  ?  ?  ?  ?  ?                ?  ?  ?  ?  ?  ?  ?  ?  ?  ?  ? ? ?Social Determinants of Health (SDOH) Interventions ?  ? ?Readmission Risk Interventions ?   ? View : No data to display.  ?  ?  ?  ? ? ?

## 2021-09-13 NOTE — Discharge Summary (Incomplete)
?Physician Discharge Summary ?  ?Patient: Kathryn Morgan MRN: [id number] DOB: 1941-04-10  ?Admit date:     09/14/2021  ?Discharge date: 09/14/2021  ?Discharge Physician: Shanon Brow Jenafer Winterton  ? ?PCP: Pablo Lawrence, NP  ? ?Recommendations at discharge:  ? Please follow up with primary care provider within 1-2 weeks ? Please repeat BMP and CBC in one week ? Please follow up on/with  ? ? ? ? ?Hospital Course: ?81 year old female former smoker with oxygen dependent COPD (3L), secondary cardiomyopathy, history of bioprosthetic aortic valve replacement, type 2 diabetes mellitus, history of necrotizing pancreatitis, status post partial pancreatectomy, osteoporosis, rheumatic fever, GERD, dyslipidemia, CKD presented to the emergency department with chest pain and shortness of breath symptoms.  She has had a difficult time for the past several months with an unexplained iron deficiency anemia.  She has been seen by hematology and has received an iron infusion and was due to have another iron infusion later in the week.  She is due to establish care with Dr. Delton Coombes 10/02/2021.  She reports unexplained weight loss. She has been dealing with a chronic dry mostly nonproductive cough for the past several weeks.  She saw her PCP and they felt that it could be an underlying pneumonia versus recurrent pulmonary edema.  She was treated with a short course of antibiotics recently.  She had no significant improvement or change from treatment.  She reports that she has had increasing malaise and generalized weakness now requiring assistance to get OOB.  She reports having to sit up in a recliner for the past several weeks to sleep due to orthopnea symptoms.  She has been taking her oral Lasix and Entresto regularly.  Of note she reports that she has not had a colonoscopy or upper endoscopy screening done. ? ?Pt was seen in ED and her CXR was concerning for a lung mass.  She was sent for CT chest. It showed bulky bilateral hilar, infrahilar,  subcarinal, paratracheal adenopathy in the chest compatible with malignancy. Possibilities could include lymphoproliferative disease or lung cancer.  Dr. Delton Coombes was consulted and said he would see patient in hospital.  CT also showed findings that suggest underlying lung infection and edema.  She was started on IV antibiotics for pneumonia and admission was requested for further management.   ? ?09/12/21:  pt had some sob this am.  I personally obtained VS:  T98.1 (oral)--114-22-161/76--91-92% on 4L.  Slept well overnight.  Denies f/,c cp, n/v/d. Hemoptysis.  Complains of trouble bring up sputum. ? ?09/13/21:  pt sleeping upon my eval.  States overall sob is improving.  CXR shows bilateral pleural effusions.  Stable at 4L.  Serum creatinine continues to improve ? ?Assessment and Plan: ?Acute on chronic respiratory failure with hypoxia ([place]) ?-- pt at baseline is on 3L/min supplemental oxygen at home ?-- currently on 5L ?-- multifactorial including COPD, PNA ?-- CT chest--bulky hilar LN, bilateral LL consolidation with question RLL mass; sm bilateral pleural effusions ?-check D-dimer 1.92 ?4/5 CTA chest--no PE; bulky mediastinal LN, bilateral LL consolidation, 5 cm LLL mass; lytic lesion to R-6th rib ?-add Yupelri and pulmicort ?-hypertonic saline if still difficulty bringing up sputum ?4/8--repeat CXR--personally reviewed--bilateral pleural effusions ? ?Lobar pneumonia ([place]) ?Continue ceftriaxone and azithro ?PCT 0.81>>1.57 ?2 more days azithro and cefdinir at time of d/c ? ?Hilar lymphadenopathy ?--follow up with oncology recommendations ?--pulmonary consult appreciated>>PET, then EBUS ?--appreciate med onc consult>>same plan  ? ?AKI (acute kidney injury) ([place]) ?Due to contrast nephropathy ?Baseline creatinine 0.6-0.9 ?Serum  creatinine peaking 1.69 ?4/8--serum creatinine down to 1.40 ?4/9--overall improving to 1.23 ? ?COPD (chronic obstructive pulmonary disease) ([place]) ?--resume home bronchodilators (or hospital  substitutions) ?--added Yulperi, pulmicort, xopenex ? ? ?Essential hypertension, benign ?--resumed coreg ? ?DM (diabetes mellitus), type 2 ([place]) ?-- reduced dose semglee ?-- novolog sliding scale ?4/4 A1C--7.7 ?CBGs elevated due to steroids ? ?Hyperkalemia ?D/c ARB ?-improved with lokelma x 2 ?-am BMP ? ?Hypercalcemia of malignancy ?Corrected calcium 11.1 ?-given IVF initially ?-intact PTH--pending at time of d/c ?-PTHrp--pending at time of dc ?-25-vit D--38.25 ? ?Malnutrition of moderate degree ?Started on nutritional supplements ? ?Iron deficiency anemia ?--Pt has been receiving IV iron infusions and Hg up to 10.  ?-- reports never had colon cancer screening  ?-- Dr. Delton Coombes consult appreciated>>no infusion needed at this time ?--iron sat 16%, ferritin 637 ? ?Abnormal weight loss ?--concerning for malignancy ?--working up as noted  ? ?S/P aortic valve replacement ?--bioprosthetic valve 2013 ?--04/06/21 Echo EF 55-60%, normal valve function ? ?Secondary cardiomyopathy ([place]) ?--Pt has been well managed medically with coreg, entresto, lasix ?--04/06/21  Echo with EF 55-60% with grade 1 DD and replaced aortic valve (October 2022) ?--pulm recommends d/c entresto ? ?Mixed hyperlipidemia ?--resume home statin therapy  ? ? ? ? ? {Tip this will not be part of the note when signed Body mass index is 33.55 kg/m?. ?,  ?Nutrition Documentation   ? ?Flowsheet Row ED to Hosp-Admission (Current) from 09/29/2021 in [facility]  ?Nutrition Problem Moderate Malnutrition  ?Etiology acute illness  ?Nutrition Goal Patient will meet greater than or equal to 90% of their needs  [if this aligns with patient health care goals]  ?Interventions Premier Protein, Education  ? ?  ?,  (Optional):26781} ? ?Pain control - [employer] Controlled Substance Reporting System database was reviewed. and patient was instructed, not to drive, operate heavy machinery, perform activities at heights, swimming or participation in  water activities or provide baby-sitting services while on Pain, Sleep and Anxiety Medications; until their outpatient Physician has advised to do so again. Also recommended to not to take more than prescribed Pain, Sleep and Anxiety Medications.  ?Consultants: pulm,med onc ?Procedures performed: none  ?Disposition: Home ?Diet recommendation:  ?Carb modified diet ?DISCHARGE MEDICATION: ?Allergies as of 09/13/2021   ? ?   Reactions  ? Amlodipine Shortness Of Breath  ? Swelling - pcp stopped   ? Codeine Itching  ? Promethazine Hcl Anxiety, Other (See Comments)  ? Pt and husband state severe hallucinations and paranoia from [place]  ? Ace Inhibitors Cough  ? Adhesive [tape] Other (See Comments)  ? Skin tearing  ? Hydralazine   ? States weakness and oxygen level dropped.  She stopped the medication  ? Levofloxacin Itching, Other (See Comments)  ? Patient unsure of exact reaction  ? ?  ?Med Rec must be completed prior to using this [place]*** ? ? ? ? ? ?  ?Durable Medical Equipment  ?(From admission, onward)  ?  ? ? ?  ? ?  Start     Ordered  ? 09/13/21 1439  For home use only DME Hospital bed  Once       ?Question Answer Comment  ?Length of Need 6 Months   ?The above medical condition requires: Patient requires the ability to reposition frequently   ?Bed type Semi-electric   ?  ? 09/13/21 1438  ? ?  ?  ? ?  ? ? ?Discharge Exam: ?Filed Weights  ? 09/10/21 0502 09/11/21  0500 09/12/21 0500  ?Weight: 82.5 kg 83.9 kg 85.9 kg  ? ?HEENT:  [place]/AT, No thrush, no icterus ?CV:  RRR, no rub, no S3, no S4 ?Lung:  bibasilar crackles. No wheeze ?Abd:  soft/+BS, NT ?Ext:  trace LEedema, no lymphangitis, no synovitis, no rash ? ? ?Condition at discharge: stable ? ?The results of significant diagnostics from this hospitalization (including imaging, microbiology, ancillary and laboratory) are listed below for reference.  ? ?Imaging Studies: ?DG Chest 2 View ? ?Result Date: 08/28/2021 ?CLINICAL DATA:  Hypoxia, dyspnea EXAM: CHEST - 2 VIEW  COMPARISON:  10/05/2016 chest radiograph. FINDINGS: Intact sternotomy wires. Aortic valve prosthesis in place. Cholecystectomy clips are seen in the right upper quadrant of the abdomen. Stable cardiomediastinal

## 2021-09-13 NOTE — Progress Notes (Addendum)
?  ?       ?PROGRESS NOTE ? ?Kathryn Morgan [id number] DOB: [birth year] DOA: 09/05/2021 ?PCP: Pablo Lawrence, NP ? ?Brief History:  ?81 year old female former smoker with oxygen dependent COPD (3L), secondary cardiomyopathy, history of bioprosthetic aortic valve replacement, type 2 diabetes mellitus, history of necrotizing pancreatitis, status post partial pancreatectomy, osteoporosis, rheumatic fever, GERD, dyslipidemia, CKD presented to the emergency department with chest pain and shortness of breath symptoms.  She has had a difficult time for the past several months with an unexplained iron deficiency anemia.  She has been seen by hematology and has received an iron infusion and was due to have another iron infusion later in the week.  She is due to establish care with Dr. Delton Coombes 09/27/2021.  She reports unexplained weight loss. She has been dealing with a chronic dry mostly nonproductive cough for the past several weeks.  She saw her PCP and they felt that it could be an underlying pneumonia versus recurrent pulmonary edema.  She was treated with a short course of antibiotics recently.  She had no significant improvement or change from treatment.  She reports that she has had increasing malaise and generalized weakness now requiring assistance to get OOB.  She reports having to sit up in a recliner for the past several weeks to sleep due to orthopnea symptoms.  She has been taking her oral Lasix and Entresto regularly.  Of note she reports that she has not had a colonoscopy or upper endoscopy screening done. ? ?Pt was seen in ED and her CXR was concerning for a lung mass.  She was sent for CT chest. It showed bulky bilateral hilar, infrahilar, subcarinal, paratracheal adenopathy in the chest compatible with malignancy. Possibilities could include lymphoproliferative disease or lung cancer.  Dr. Delton Coombes was consulted and said he would see patient in hospital.  CT also showed findings that suggest  underlying lung infection and edema.  She was started on IV antibiotics for pneumonia and admission was requested for further management.   ? ?09/12/21:  pt had some sob this am.  I personally obtained VS:  T98.1 (oral)--114-22-161/76--91-92% on 4L.  Slept well overnight.  Denies f/,c cp, n/v/d. Hemoptysis.  Complains of trouble bring up sputum. ? ?09/13/21:  pt sleeping upon my eval.  States overall sob is improving.  CXR shows bilateral pleural effusions.  Stable at 4L.  Serum creatinine continues to improve  ? ? ?Assessment and Plan: ?Acute on chronic respiratory failure with hypoxia ([place]) ?-- pt at baseline is on 3L/min supplemental oxygen at home ?-- currently on 5L ?-- multifactorial including COPD, PNA ?-- CT chest--bulky hilar LN, bilateral LL consolidation with question RLL mass; sm bilateral pleural effusions ?-check D-dimer 1.92 ?4/5 CTA chest--no PE; bulky mediastinal LN, bilateral LL consolidation, 5 cm LLL mass; lytic lesion to R-6th rib ?-add Yupelri and pulmicort ?-hypertonic saline if still difficulty bringing up sputum ?4/8--repeat CXR--personally reviewed--bilateral pleural effusions ? ?Lobar pneumonia ([place]) ?Continue ceftriaxone and azithro ?PCT 0.81>>1.57 ? ?Hilar lymphadenopathy ?--follow up with oncology recommendations ?--pulmonary consult appreciated>>PET, then EBUS ?--appreciate med onc consult>>same plan  ? ?AKI (acute kidney injury) ([place]) ?Due to contrast nephropathy ?Baseline creatinine 0.6-0.9 ?Serum creatinine peaking 1.69 ?4/8--serum creatinine down to 1.40 ?4/9--overall improving to 1.23 ? ?COPD (chronic obstructive pulmonary disease) ([place]) ?--resume home bronchodilators (or hospital substitutions) ?--added Yulperi, pulmicort, xopenex ? ? ?Essential hypertension, benign ?--resumed coreg ? ?DM (diabetes mellitus), type 2 ([place]) ?-- reduced dose semglee ?-- novolog sliding scale ?4/4 A1C--7.7 ?CBGs  elevated due to steroids ? ?Hyperkalemia ?D/c ARB ?-improved with lokelma x 2 ?-am  BMP ? ?Hypercalcemia of malignancy ?Corrected calcium 11.1 ?-continue IVF ?-intact PTH ?-PTHrp ?-25-vit D--38.25 ? ?Malnutrition of moderate degree ?Started on nutritional supplements ? ?Iron deficiency anemia ?--Pt has been receiving IV iron infusions and Hg up to 10.  ?-- reports never had colon cancer screening  ?-- Dr. Delton Coombes consult appreciated>>no infusion needed at this time ?--iron sat 16%, ferritin 637 ? ?Abnormal weight loss ?--concerning for malignancy ?--working up as noted  ? ?S/P aortic valve replacement ?--bioprosthetic valve 2013 ?--04/06/21 Echo EF 55-60%, normal valve function ? ?Secondary cardiomyopathy ([place]) ?--Pt has been well managed medically with coreg, entresto, lasix ?--04/06/21  Echo with EF 55-60% with grade 1 DD and replaced aortic valve (October 2022) ?--pulm recommends d/c entresto ? ?Mixed hyperlipidemia ?--resume home statin therapy  ? ? ? ?Status is: Inpatient ?Remains inpatient appropriate because: severity of illness requiring IV abx and IVF ?  ?  ?  ?Family Communication:   grand daughter updated at bedside 4/9 ?  ?Consultants:  pulm, palliative ?  ?Code Status:  FULL ?  ?DVT Prophylaxis:  [place] Lovenox ?  ?  ?Procedures: ?As Listed in Progress Note Above ?  ?Antibiotics: ?Ceftriaxone 4/4>>4/8 ?Azithro 4/4>>4/8 ? ? ? ? ? ? ? ? ? ?Subjective: ?Patient denies fevers, chills, headache, chest pain, dyspnea, nausea, vomiting, diarrhea, abdominal pain, dysuria ? ? ?Objective: ?Vitals:  ? 09/13/21 0831 09/13/21 0837 09/13/21 1226 09/13/21 1331  ?BP:   (!) 145/68   ?Pulse:   96   ?Resp:   18   ?Temp:   98.4 ?F (36.9 ?C)   ?TempSrc:      ?SpO2: 100% 100% 95% 93%  ?Weight:      ?Height:      ? ? ?Intake/Output Summary (Last 24 hours) at 09/13/2021 1450 ?Last data filed at 09/13/2021 1300 ?Gross per 24 hour  ?Intake 520 ml  ?Output 600 ml  ?Net -80 ml  ? ?Weight change:  ?Exam: ? ?General:  Pt is alert, follows commands appropriately, not in acute distress ?HEENT: No icterus, No thrush, No  neck mass, [place]/AT ?Cardiovascular: RRR, S1/S2, no rubs, no gallops ?Respiratory: diminished BS at bases.  Bibasilar crackles. No wheeze ?Abdomen: Soft/+BS, non tender, non distended, no guarding ?Extremities: trace LE edema, No lymphangitis, No petechiae, No rashes, no synovitis ? ? ?Data Reviewed: ?I have personally reviewed following labs and imaging studies ?Basic Metabolic Panel: ?Recent Labs  ?Lab 09/09/21 ?[id number] 09/10/21 ?[id number] 09/11/21 ?[id number] 09/11/21 ?1402 09/12/21 ?[id number] 09/13/21 ?[id number]  ?NA 133* 132* 131* 130* 134* 136  ?K 4.9 5.5* 5.7* 5.0 5.0 4.8  ?CL 85* 87* 87* 87* 90* 94*  ?CO2 39* 35* 34* 34* 33* 35*  ?GLUCOSE 183* 289* 228* 202* 175* 179*  ?BUN 28* 36* 48* 53* 56* 58*  ?CREATININE 0.88 1.29* 1.62* 1.68* 1.40* 1.23*  ?CALCIUM 10.2 10.0 9.8 9.7 10.1 9.7  ?MG 1.6* 2.2 2.2  --  2.3  --   ?PHOS  --   --   --   --   --  4.0  ? ?Liver Function Tests: ?Recent Labs  ?Lab 09/10/2021 ?0908 09/09/21 ?[id number] 09/10/21 ?[id number] 09/11/21 ?[id number] 09/12/21 ?0440  ?AST 17 14* 13* 13* 14*  ?ALT 18 13 13 13 13   ?ALKPHOS 70 62 86 79 76  ?BILITOT 0.5 0.4 0.7 0.5 0.4  ?PROT 7.1 6.5 6.3* 6.1* 6.2*  ?ALBUMIN 3.0* 2.6* 2.6* 2.5* 2.4*  ? ?No results for  input(s): LIPASE, AMYLASE in the last 168 hours. ?No results for input(s): AMMONIA in the last 168 hours. ?Coagulation Profile: ?No results for input(s): INR, PROTIME in the last 168 hours. ?CBC: ?Recent Labs  ?Lab 09/27/2021 ?0908 09/09/21 ?[id number] 09/10/21 ?[id number] 09/11/21 ?[id number]  ?WBC 12.4* 12.9* 11.1* 17.1*  ?NEUTROABS  --  10.5* 10.1* 15.3*  ?HGB 10.7* 9.8* 9.7* 9.5*  ?HCT 34.3* 33.3* 33.4* 31.9*  ?MCV 82.5 86.3 86.3 85.3  ?PLT 396 378 377 367  ? ?Cardiac Enzymes: ?No results for input(s): CKTOTAL, CKMB, CKMBINDEX, TROPONINI in the last 168 hours. ?BNP: ?Invalid input(s): POCBNP ?CBG: ?Recent Labs  ?Lab 09/12/21 ?1615 09/12/21 ?2115 09/13/21 ?0319 09/13/21 ?[id number] 09/13/21 ?1107  ?GLUCAP 191* 182* 189* 180* 230*  ? ?HbA1C: ?No results for input(s): HGBA1C in the last 72 hours. ?Urine analysis: ?    ?Component Value Date/Time  ? COLORURINE YELLOW 12/09/2013 0835  ? APPEARANCEUR HAZY (A) 12/09/2013 0835  ? LABSPEC 1.010 12/09/2013 0835  ? PHURINE 5.5 12/09/2013 0835  ? GLUCOSEU NEGATIVE 12/09/2013 0835  ? HGBUR NEGATI

## 2021-09-14 DIAGNOSIS — R590 Localized enlarged lymph nodes: Secondary | ICD-10-CM | POA: Diagnosis not present

## 2021-09-14 DIAGNOSIS — Z515 Encounter for palliative care: Secondary | ICD-10-CM | POA: Diagnosis not present

## 2021-09-14 DIAGNOSIS — Z7189 Other specified counseling: Secondary | ICD-10-CM | POA: Diagnosis not present

## 2021-09-14 DIAGNOSIS — J9621 Acute and chronic respiratory failure with hypoxia: Secondary | ICD-10-CM | POA: Diagnosis not present

## 2021-09-14 DIAGNOSIS — N179 Acute kidney failure, unspecified: Secondary | ICD-10-CM | POA: Diagnosis not present

## 2021-09-14 LAB — BASIC METABOLIC PANEL
Anion gap: 6 (ref 5–15)
BUN: 54 mg/dL — ABNORMAL HIGH (ref 8–23)
CO2: 36 mmol/L — ABNORMAL HIGH (ref 22–32)
Calcium: 10 mg/dL (ref 8.9–10.3)
Chloride: 95 mmol/L — ABNORMAL LOW (ref 98–111)
Creatinine, Ser: 1.01 mg/dL — ABNORMAL HIGH (ref 0.44–1.00)
GFR, Estimated: 57 mL/min — ABNORMAL LOW (ref >60)
Glucose, Bld: 217 mg/dL — ABNORMAL HIGH (ref 70–99)
Potassium: 4.9 mmol/L (ref 3.5–5.1)
Sodium: 137 mmol/L (ref 135–145)

## 2021-09-14 LAB — GLUCOSE, CAPILLARY
Glucose-Capillary: 212 mg/dL — ABNORMAL HIGH (ref 70–99)
Glucose-Capillary: 212 mg/dL — ABNORMAL HIGH (ref 70–99)
Glucose-Capillary: 195 mg/dL — ABNORMAL HIGH (ref 70–99)
Glucose-Capillary: 163 mg/dL — ABNORMAL HIGH (ref 70–99)
Glucose-Capillary: 216 mg/dL — ABNORMAL HIGH (ref 70–99)

## 2021-09-14 LAB — CALCITRIOL (1,25 DI-OH VIT D): Vit D, 1,25-Dihydroxy: 24 pg/mL — ABNORMAL LOW (ref 24.8–81.5)

## 2021-09-14 LAB — PARATHYROID HORMONE, INTACT (NO CA): PTH: 30 pg/mL (ref 15–65)

## 2021-09-14 MED ORDER — BISACODYL 10 MG RE SUPP
10.0000 mg | Freq: Every day | RECTAL | Status: DC | PRN
  Administered 2021-09-15: 10 mg via RECTAL
  Filled 2021-09-14: qty 1

## 2021-09-14 MED ORDER — SENNOSIDES-DOCUSATE SODIUM 8.6-50 MG PO TABS
2.0000 | ORAL_TABLET | Freq: Two times a day (BID) | ORAL | Status: DC
  Administered 2021-09-14 – 2021-09-15 (×3): 2 via ORAL
  Filled 2021-09-14 (×3): qty 2

## 2021-09-14 MED ORDER — MINERAL OIL RE ENEM: 1.0000 | ENEMA | Freq: Every day | RECTAL | Status: DC | PRN

## 2021-09-14 MED ORDER — FUROSEMIDE 10 MG/ML IJ SOLN
40.0000 mg | Freq: Once | INTRAMUSCULAR | Status: AC
  Administered 2021-09-14: 40 mg via INTRAVENOUS
  Filled 2021-09-14: qty 4

## 2021-09-14 MED ORDER — LEVALBUTEROL HCL 0.63 MG/3ML IN NEBU
0.6300 mg | INHALATION_SOLUTION | Freq: Two times a day (BID) | RESPIRATORY_TRACT | Status: DC
  Administered 2021-09-14: 0.63 mg via RESPIRATORY_TRACT
  Filled 2021-09-14 (×3): qty 3

## 2021-09-14 NOTE — Plan of Care (Signed)
  Problem: Skin Integrity: Goal: Risk for impaired skin integrity will decrease Outcome: Progressing   Problem: Safety: Goal: Ability to remain free from injury will improve Outcome: Progressing   Problem: Pain Managment: Goal: General experience of comfort will improve Outcome: Progressing   Problem: Coping: Goal: Level of anxiety will decrease Outcome: Progressing   Problem: Nutrition: Goal: Adequate nutrition will be maintained Outcome: Progressing   

## 2021-09-14 NOTE — Progress Notes (Signed)
?  ?       ?PROGRESS NOTE ? ?Kathryn Morgan [id number] DOB: 07-Dec-1940 DOA: 09/26/2021 ?PCP: Pablo Lawrence, NP ? ?Brief History:  ?81 year old female former smoker with oxygen dependent COPD (3L), secondary cardiomyopathy, history of bioprosthetic aortic valve replacement, type 2 diabetes mellitus, history of necrotizing pancreatitis, status post partial pancreatectomy, osteoporosis, rheumatic fever, GERD, dyslipidemia, CKD presented to the emergency department with chest pain and shortness of breath symptoms.  She has had a difficult time for the past several months with an unexplained iron deficiency anemia.  She has been seen by hematology and has received an iron infusion and was due to have another iron infusion later in the week.  She is due to establish care with Dr. Delton Coombes 10/01/2021.  She reports unexplained weight loss. She has been dealing with a chronic dry mostly nonproductive cough for the past several weeks.  She saw her PCP and they felt that it could be an underlying pneumonia versus recurrent pulmonary edema.  She was treated with a short course of antibiotics recently.  She had no significant improvement or change from treatment.  She reports that she has had increasing malaise and generalized weakness now requiring assistance to get OOB.  She reports having to sit up in a recliner for the past several weeks to sleep due to orthopnea symptoms.  She has been taking her oral Lasix and Entresto regularly.  Of note she reports that she has not had a colonoscopy or upper endoscopy screening done. ? ?Pt was seen in ED and her CXR was concerning for a lung mass.  She was sent for CT chest. It showed bulky bilateral hilar, infrahilar, subcarinal, paratracheal adenopathy in the chest compatible with malignancy. Possibilities could include lymphoproliferative disease or lung cancer.  Dr. Delton Coombes was consulted and said he would see patient in hospital.  CT also showed findings that suggest  underlying lung infection and edema.  She was started on IV antibiotics for pneumonia and admission was requested for further management.   ? ?09/12/21:  pt had some sob this am.  I personally obtained VS:  T98.1 (oral)--114-22-161/76--91-92% on 4L.  Slept well overnight.  Denies f/,c cp, n/v/d. Hemoptysis.  Complains of trouble bring up sputum. ? ?09/13/21:  pt sleeping upon my eval.  States overall sob is improving.  CXR shows bilateral pleural effusions.  Stable at 4L.  Serum creatinine continues to improve ? ?09/14/21:  more sob;  up to 6L.  Give Lasix 40 IV x 1 in light of 09/12/21 CXR findings  ? ? ? ?Assessment and Plan: ?Acute on chronic respiratory failure with hypoxia ([place]) ?-- pt at baseline is on 3L/min supplemental oxygen at home ?-- currently on 5L ?-- multifactorial including COPD, PNA and pleural effusion ?-- CT chest--bulky hilar LN, bilateral LL consolidation with question RLL mass; sm bilateral pleural effusions ?-check D-dimer 1.92 ?4/5 CTA chest--no PE; bulky mediastinal LN, bilateral LL consolidation, 5 cm LLL mass; lytic lesion to R-6th rib ?-add Yupelri and pulmicort ?-hypertonic saline if still difficulty bringing up sputum ?4/8--repeat CXR--personally reviewed--bilateral pleural effusions ?09/14/21--give lasix IV ? ?Lobar pneumonia ([place]) ?Continue ceftriaxone and azithro ?PCT 0.81>>1.57 ?Finished 7 days abx ? ?Hilar lymphadenopathy ?--follow up with oncology recommendations ?--pulmonary consult appreciated>>PET, then EBUS ?--appreciate med onc consult>>same plan  ? ?AKI (acute kidney injury) ([place]) ?Due to contrast nephropathy ?Baseline creatinine 0.6-0.9 ?Serum creatinine peaking 1.69 ?4/8--serum creatinine down to 1.40 ?4/10--overall improving to 1.01 ? ?COPD (chronic obstructive pulmonary disease) ([place]) ?--resume home  bronchodilators (or hospital substitutions) ?--added Yulperi, pulmicort, xopenex ? ? ?Essential hypertension, benign ?--resumed coreg ? ?DM (diabetes mellitus), type 2 ([facility]) ?--  reduced dose semglee ?-- novolog sliding scale ?4/4 A1C--7.7 ?CBGs elevated due to steroids ? ?Hyperkalemia ?D/c ARB ?-improved with lokelma x 2 ?-am BMP ? ?Hypercalcemia of malignancy ?Corrected calcium 11.1 ?-given IVF initially ?-intact PTH--30 ?-PTHrp--pending at time of dc ?-25-vit D--38.25 ?-calcitriol level - 24.0 ? ?Malnutrition of moderate degree ?Started on nutritional supplements ? ?Iron deficiency anemia ?--Pt has been receiving IV iron infusions and Hg up to 10.  ?-- reports never had colon cancer screening  ?-- Dr. Ellin Saba consult appreciated>>no infusion needed at this time ?--iron sat 16%, ferritin 637 ? ?Abnormal weight loss ?--concerning for malignancy ?--working up as noted  ? ?S/P aortic valve replacement ?--bioprosthetic valve 2013 ?--04/06/21 Echo EF 55-60%, normal valve function ? ?Secondary cardiomyopathy ([facility]) ?--Pt has been well managed medically with coreg, entresto, lasix ?--04/06/21  Echo with EF 55-60% with grade 1 DD and replaced aortic valve (October 2022) ?--pulm recommends d/c entresto ? ?Mixed hyperlipidemia ?--resume home statin therapy  ? ? ?Status is: Inpatient ?Remains inpatient appropriate because: severity of illness requiring IV abx and IVF ?  ?  ?  ?Family Communication:   grand daughter updated at bedside 4/9 ?  ?Consultants:  pulm, palliative ?  ?Code Status:  FULL ?  ?DVT Prophylaxis:  [place] Lovenox ?  ?  ?Procedures: ?As Listed in Progress Note Above ?  ?Antibiotics: ?Ceftriaxone 4/4>>4/8 ?Azithro 4/4>>4/8 ?  ? ? ? ? ? ? ? ?Subjective: ? ?Patient complains of sob.  Denies cp, n/v/d, abd pain ?Objective: ?Vitals:  ? 09/14/21 0500 09/14/21 0518 09/14/21 0736 09/14/21 1516  ?BP:  140/78  133/82  ?Pulse:  100  93  ?Resp:  20  18  ?Temp:  (!) 97.5 ?F (36.4 ?C)  97.9 ?F (36.6 ?C)  ?TempSrc:  Oral  Oral  ?SpO2:  94% 95% 96%  ?Weight: 86.1 kg     ?Height:      ? ? ?Intake/Output Summary (Last 24 hours) at 09/14/2021 1753 ?Last data filed at 09/14/2021 1300 ?Gross per 24 hour   ?Intake 600 ml  ?Output 600 ml  ?Net 0 ml  ? ?Weight change:  ?Exam: ? ?General:  Pt is alert, follows commands appropriately, not in acute distress ?HEENT: No icterus, No thrush, No neck mass, [place]/AT ?Cardiovascular: RRR, S1/S2, no rubs, no gallops ?Respiratory: bibasilar crackles.  Diminished BS ?Abdomen: Soft/+BS, non tender, non distended, no guarding ?Extremities: No edema, No lymphangitis, No petechiae, No rashes, no synovitis ? ? ?Data Reviewed: ?I have personally reviewed following labs and imaging studies ?Basic Metabolic Panel: ?Recent Labs  ?Lab 09/09/21 ?2518 09/10/21 ?9842 09/11/21 ?1031 09/11/21 ?1402 09/12/21 ?2811 09/13/21 ?8867 09/14/21 ?7373  ?NA 133* 132* 131* 130* 134* 136 137  ?K 4.9 5.5* 5.7* 5.0 5.0 4.8 4.9  ?CL 85* 87* 87* 87* 90* 94* 95*  ?CO2 39* 35* 34* 34* 33* 35* 36*  ?GLUCOSE 183* 289* 228* 202* 175* 179* 217*  ?BUN 28* 36* 48* 53* 56* 58* 54*  ?CREATININE 0.88 1.29* 1.62* 1.68* 1.40* 1.23* 1.01*  ?CALCIUM 10.2 10.0 9.8 9.7 10.1 9.7 10.0  ?MG 1.6* 2.2 2.2  --  2.3  --   --   ?PHOS  --   --   --   --   --  4.0  --   ? ?Liver Function Tests: ?Recent Labs  ?Lab 2021/09/25 ?0908 09/09/21 ?6681 09/10/21 ?5947  09/11/21 ?[id number] 09/12/21 ?0440  ?AST 17 14* 13* 13* 14*  ?ALT 18 13 13 13 13   ?ALKPHOS 70 62 86 79 76  ?BILITOT 0.5 0.4 0.7 0.5 0.4  ?PROT 7.1 6.5 6.3* 6.1* 6.2*  ?ALBUMIN 3.0* 2.6* 2.6* 2.5* 2.4*  ? ?No results for input(s): LIPASE, AMYLASE in the last 168 hours. ?No results for input(s): AMMONIA in the last 168 hours. ?Coagulation Profile: ?No results for input(s): INR, PROTIME in the last 168 hours. ?CBC: ?Recent Labs  ?Lab 09/24/2021 ?0908 09/09/21 ?[id number] 09/10/21 ?[id number] 09/11/21 ?[id number]  ?WBC 12.4* 12.9* 11.1* 17.1*  ?NEUTROABS  --  10.5* 10.1* 15.3*  ?HGB 10.7* 9.8* 9.7* 9.5*  ?HCT 34.3* 33.3* 33.4* 31.9*  ?MCV 82.5 86.3 86.3 85.3  ?PLT 396 378 377 367  ? ?Cardiac Enzymes: ?No results for input(s): CKTOTAL, CKMB, CKMBINDEX, TROPONINI in the last 168 hours. ?BNP: ?Invalid input(s):  POCBNP ?CBG: ?Recent Labs  ?Lab 09/13/21 ?2043 09/14/21 ?0421 09/14/21 ?[id number] 09/14/21 ?1142 09/14/21 ?[id number]  ?GLUCAP 205* 212* 212* 195* 163*  ? ?HbA1C: ?No results for input(s): HGBA1C in the last 72 hours. ?Urine analysis:

## 2021-09-14 NOTE — Progress Notes (Addendum)
Palliative: ?Kathryn Morgan is lying quietly in bed in a dark room.  She appears acutely/chronically ill and quite frail.  When I enter the room, she states that she does not feel like talking today.  Her daughter, Kathryn Morgan, is at bedside.  I share that I will catch up with her daughter. ? ?Kathryn Morgan shares that Kathryn Morgan has continued to get weaker during her hospital stay.  They were considering discharge, but she is clearly not ready.  I shared that transition of care team is working for needs upon discharge.  Kathryn Morgan asks about another chest x-ray to see if the pneumonia is improving.  I share that although pneumonia can be seen fairly quickly, it can take several weeks for resolution to be seen on x-ray.  We talk about postobstructive pneumonia, and the way to treat is to shrink what ever is causing obstruction. ? ?We talk about further testing on an outpatient basis with PET scan and tissue sampling.  Kathryn Morgan responds that she is too weak for any further testing at this point.  I share with Kathryn Morgan that we will continue to support her at her wishes. ? ?Kathryn Morgan and I talk in the hallway.  We talk about Kathryn Morgan's acute declines over the past 1 month in particular.  I share a diagram of the chronic illness pathway, what is normal and expected.  I share the changes that occur when people have cancer.  We briefly talk about prognosis.  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  We talk about a few "what if's and maybe's".  I share that it is important to discuss what Kathryn Morgan wants this time to look like and feel like.  Does she want to spend time in her home surrounded by loved ones or does she want to seek further testing and treatment options. ? ?Kathryn Morgan states that Kathryn Morgan and her husband had CODE STATUS discussions this morning.  She shares that Kathryn Morgan states that she would want life support if she could be guaranteed that she would be able to be extubated.   Kathryn Morgan shares that she discussed with her mother the likelihood that she would not be able to get off the ventilator with all of her health issues.  I share that I would agree, it would be unlikely that Kathryn Morgan would improve enough to be extubated.  ? ?Kathryn Morgan and I talk about the benefits of at home hospice care for comfort and dignity at end-of-life.  We talk about symptom management with morphine by mouth.  Kathryn Morgan is asking appropriate questions. ? ?We also talk about bowel regimen, orders adjusted, discussed with bedside nursing staff. ? ?We plan for family meeting at bedside tomorrow at 9 AM with Kathryn Morgan, Mr. Chriswell, and [place]. ? ?Conference with attending, oncology, bedside nursing staff, transition of care team related to patient condition, needs, goals of care, disposition.   ? ?Plan:    At this point continue to treat the treatable, time for outcomes.  Considering CODE STATUS. ?Family meeting at bedside 4/11 0900. ? ?50 minutes  ?Lillia Carmel, NP ?Palliative medicine team ?Team phone [phone] ?Greater than 50% of this time was spent counseling and coordinating care related to the above assessment and plan.    ?

## 2021-09-14 NOTE — Care Management Important Message (Signed)
Important Message ? ?Patient Details  ?Name: Kathryn Morgan ?MRN: [id number] ?Date of Birth: 08-13-1940 ? ? ?Medicare Important Message Given:  Yes ? ?Reviewed Medicare IM via room phone [phone]) with Lanell Matar, daughter.  Will review copy of Medicare IM sent securely to [email or web address].   ? ? ?[name] ?09/14/2021, 10:35 AM ?

## 2021-09-14 NOTE — TOC Initial Note (Addendum)
Transition of Care (TOC) - Initial/Assessment Note  ? ? ?Patient Details  ?Name: Kathryn Morgan ?MRN: [id number] ?Date of Birth: [birth year] ? ?Transition of Care (TOC) CM/SW Contact:    ?Villa Herb, LCSWA ?Phone Number: ?09/14/2021, 11:35 AM ? ?Clinical Narrative:                 ?CSW noted that PT recommended SNF for pt 4/6. CSW reached out to pts daughter to speak about SNF recommendation. Pts daughter states they are not interested in SNF at this time. They are agreeable to [facility] services at D/C. CSW confirmed [facility] is working on getting hospital bed delivered to pts home.  ? ?CSW spoke to [place] with [facility] who accepts pts referral for [facility] PT and RN services. CSW requested that MD place [facility] orders. TOC to follow.  ? ?Expected Discharge Plan: Home w Home Health Services ?Barriers to Discharge: Continued Medical Work up ? ? ?Patient Goals and CMS Choice ?Patient states their goals for this hospitalization and ongoing recovery are:: Home with HH ?CMS Medicare.gov Compare Post Acute Care list provided to:: Patient ?Choice offered to / list presented to : Patient ? ?Expected Discharge Plan and Services ?Expected Discharge Plan: Home w Home Health Services ?In-house Referral: Clinical Social Work ?Discharge Planning Services: CM Consult ?Post Acute Care Choice: Home Health ?Living arrangements for the past 2 months: Single Family Home ?                ?  ?  ?  ?  ?  ?  ?  ?  ?  ?  ? ?Prior Living Arrangements/Services ?Living arrangements for the past 2 months: Single Family Home ?Lives with:: Adult Children, Self ?Patient language and need for interpreter reviewed:: Yes ?Do you feel safe going back to the place where you live?: Yes      ?Need for Family Participation in Patient Care: Yes (Comment) ?Care giver support system in place?: Yes (comment) ?Current home services: DME ?Criminal Activity/Legal Involvement Pertinent to Current Situation/Hospitalization: No - Comment as needed ? ?Activities of Daily  Living ?Home Assistive Devices/Equipment: Dentures (specify type), Eyeglasses, Wheelchair, Walker (specify type), Bedside commode/3-in-1, Oxygen ?ADL Screening (condition at time of admission) ?Patient's cognitive ability adequate to safely complete daily activities?: Yes ?Is the patient deaf or have difficulty hearing?: No ?Does the patient have difficulty seeing, even when wearing glasses/contacts?: No ?Does the patient have difficulty concentrating, remembering, or making decisions?: No ?Patient able to express need for assistance with ADLs?: Yes ?Does the patient have difficulty dressing or bathing?: Yes ?Independently performs ADLs?: No ?Communication: Independent ?Dressing (OT): Needs assistance ?Is this a change from baseline?: Change from baseline, expected to last <3days ?Grooming: Needs assistance ?Is this a change from baseline?: Change from baseline, expected to last <3 days ?Feeding: Independent ?Bathing: Needs assistance ?Is this a change from baseline?: Change from baseline, expected to last <3 days ?Toileting: Dependent ?Is this a change from baseline?: Change from baseline, expected to last <3 days ?In/Out Bed: Dependent ?Is this a change from baseline?: Change from baseline, expected to last <3 days ?Walks in Home: Dependent ?Is this a change from baseline?: Change from baseline, expected to last <3 days ?Does the patient have difficulty walking or climbing stairs?: Yes ?Weakness of Legs: Both ?Weakness of Arms/Hands: Both ? ?Permission Sought/Granted ?  ?  ?   ?   ?   ?   ? ?Emotional Assessment ?Appearance:: Appears stated age ?Attitude/Demeanor/Rapport: Engaged ?Affect (typically observed):  Accepting ?  ?Alcohol / Substance Use: Not Applicable ?Psych Involvement: No (comment) ? ?Admission diagnosis:  CAP (community acquired pneumonia) [J18.9] ?Dyspnea, unspecified type [R06.00] ?Patient Active Problem List  ? Diagnosis Date Noted  ? AKI (acute kidney injury) (HCC) 09/11/2021  ? Malnutrition of  moderate degree 09/10/2021  ? Hypercalcemia of malignancy 09/10/2021  ? Hyperkalemia 09/10/2021  ? Lobar pneumonia (HCC) 09/09/2021  ? Abnormal weight loss 2021-09-23  ? Hilar lymphadenopathy 09/23/21  ? Acute on chronic respiratory failure with hypoxia (HCC) 2021/09/23  ? Iron deficiency anemia September 23, 2021  ? Salivary gland infections 11/25/2014  ? Salivary gland abscess 11/25/2014  ? Type 2 diabetes mellitus (HCC) 11/25/2014  ? Chronic kidney disease 11/25/2014  ? RBBB 07/31/2012  ? Essential hypertension, benign 01/07/2012  ? S/P aortic valve replacement 08/27/2011  ? AS (aortic stenosis) 08/23/2011  ? Obesity (BMI 30-39.9) 08/10/2011  ? Mixed hyperlipidemia 07/06/2011  ? 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 DM (diabetes mellitus), type 2 (HCC) 05/30/2011  ? COPD (chronic obstructive pulmonary disease) (HCC) 05/30/2011  ? ?PCP:  Roe Rutherford, NP ?Pharmacy:   ?[facility]) - [place], [place] - [address] [place] ?[address] [place] ?[address] ?[place] [address] ?Phone: [phone] Fax: [phone] ? ?[facility] - [place], [place] - [address] ?[address] ?[place] [address] ?Phone: [phone] Fax: [phone] ? ? ? ? ?Social Determinants of Health (SDOH) Interventions ?  ? ?Readmission Risk Interventions ?   ? View : No data to display.  ?  ?  ?  ? ? ? ?

## 2021-09-15 ENCOUNTER — Other Ambulatory Visit (HOSPITAL_COMMUNITY): Payer: Medicare Other

## 2021-09-15 ENCOUNTER — Inpatient Hospital Stay (HOSPITAL_COMMUNITY): Payer: Medicare Other

## 2021-09-15 DIAGNOSIS — E8779 Other fluid overload: Secondary | ICD-10-CM

## 2021-09-15 DIAGNOSIS — Z515 Encounter for palliative care: Secondary | ICD-10-CM | POA: Diagnosis not present

## 2021-09-15 DIAGNOSIS — R590 Localized enlarged lymph nodes: Secondary | ICD-10-CM | POA: Diagnosis not present

## 2021-09-15 DIAGNOSIS — Z7189 Other specified counseling: Secondary | ICD-10-CM | POA: Diagnosis not present

## 2021-09-15 DIAGNOSIS — N179 Acute kidney failure, unspecified: Secondary | ICD-10-CM | POA: Diagnosis not present

## 2021-09-15 DIAGNOSIS — J9621 Acute and chronic respiratory failure with hypoxia: Secondary | ICD-10-CM | POA: Diagnosis not present

## 2021-09-15 DIAGNOSIS — E8770 Fluid overload, unspecified: Secondary | ICD-10-CM

## 2021-09-15 LAB — BASIC METABOLIC PANEL
Anion gap: 8 (ref 5–15)
BUN: 50 mg/dL — ABNORMAL HIGH (ref 8–23)
CO2: 37 mmol/L — ABNORMAL HIGH (ref 22–32)
Calcium: 10.2 mg/dL (ref 8.9–10.3)
Chloride: 95 mmol/L — ABNORMAL LOW (ref 98–111)
Creatinine, Ser: 0.84 mg/dL (ref 0.44–1.00)
GFR, Estimated: >60 mL/min (ref >60)
Glucose, Bld: 114 mg/dL — ABNORMAL HIGH (ref 70–99)
Potassium: 4.6 mmol/L (ref 3.5–5.1)
Sodium: 140 mmol/L (ref 135–145)

## 2021-09-15 LAB — PROCALCITONIN: Procalcitonin: 1.83 ng/mL

## 2021-09-15 LAB — GLUCOSE, CAPILLARY
Glucose-Capillary: 127 mg/dL — ABNORMAL HIGH (ref 70–99)
Glucose-Capillary: 103 mg/dL — ABNORMAL HIGH (ref 70–99)
Glucose-Capillary: 90 mg/dL (ref 70–99)
Glucose-Capillary: 92 mg/dL (ref 70–99)

## 2021-09-15 LAB — BRAIN NATRIURETIC PEPTIDE: B Natriuretic Peptide: 184 pg/mL — ABNORMAL HIGH (ref 0.0–100.0)

## 2021-09-15 MED ORDER — CHLORHEXIDINE GLUCONATE CLOTH 2 % EX PADS
6.0000 | MEDICATED_PAD | Freq: Every day | CUTANEOUS | Status: DC
  Administered 2021-09-15: 6 via TOPICAL

## 2021-09-15 MED ORDER — FUROSEMIDE 10 MG/ML IJ SOLN
40.0000 mg | Freq: Two times a day (BID) | INTRAMUSCULAR | Status: DC
  Administered 2021-09-15 (×2): 40 mg via INTRAVENOUS
  Filled 2021-09-15 (×2): qty 4

## 2021-09-15 MED ORDER — LORAZEPAM 1 MG PO TABS
1.0000 mg | ORAL_TABLET | Freq: Four times a day (QID) | ORAL | Status: DC | PRN
  Administered 2021-09-15: 2 mg via ORAL
  Filled 2021-09-15: qty 2

## 2021-09-15 MED ORDER — MORPHINE SULFATE (CONCENTRATE) 10 MG/0.5ML PO SOLN
2.5000 mg | ORAL | Status: DC | PRN
  Administered 2021-09-15: 4 mg via ORAL
  Filled 2021-09-15 (×2): qty 0.5

## 2021-09-15 NOTE — TOC Progression Note (Signed)
Transition of Care (TOC) - Progression Note  ? ? ?Patient Details  ?Name: Kathryn Morgan ?MRN: [id number] ?Date of Birth: April 18, 1941 ? ?Transition of Care (TOC) CM/SW Contact  ?Iona Beard, LCSWA ?Phone Number: ?09/15/2021, 11:49 AM ? ?Clinical Narrative:    ?CSW updated that palliative has met with the family and now family wishes for pt to return home with hospice services through [facility]. CSW reached out to [facility] at hospice to give referral. Marchia Meiers states they will reach out to family to and get referral process started. TOC to follow.  ? ?Expected Discharge Plan: [place] ?Barriers to Discharge: Continued Medical Work up ? ?Expected Discharge Plan and Services ?Expected Discharge Plan: [place] ?In-house Referral: Clinical Social Work ?Discharge Planning Services: CM Consult ?Post Acute Care Choice: Home Health ?Living arrangements for the past 2 months: [place] ?                ?  ?  ?  ?  ?  ?  ?  ?  ?  ?  ? ? ?Social Determinants of Health (SDOH) Interventions ?  ? ?Readmission Risk Interventions ?   ? View : No data to display.  ?  ?  ?  ? ? ?

## 2021-09-15 NOTE — Assessment & Plan Note (Addendum)
4/8 personally reviewed CXR--vascular congestion, pleural effusions ?09/15/21--increase lasix to bid ?09/15/21-repeat CXR--personally reviewed>>vascular congestion.  Bibasilar opacities ?

## 2021-09-15 NOTE — Assessment & Plan Note (Addendum)
Changed to DNR toay ?Palliative medicine following ?Plan to d/c home with hospice if stable ?

## 2021-09-15 NOTE — Progress Notes (Signed)
Palliative: ?Kathryn Morgan is lying quietly in bed.  She appears acutely/chronically ill and frail, pale.  She will open her eyes, but not make eye contact.  She can make her basic needs known.  Her husband of 53 years, Aaralynn Shepheard, is at bedside along with daughter, Augusto Gamble. ? ?We talk in detail about Kathryn Morgan's acute health concerns and declines.  We talked about the chronic illness pathway, what is normal and expected.  I share a diagram of the chronic illness pathway and also the difference for people who have cancer.  We talk about a few "what if's and maybe's".   ? ?We talked about the benefits of at home hospice care for comfort and dignity.  We talk about what is and is not provided with at home hospice.  Family states that they received and hospital bed yesterday.  Mr. Mcshea shares that they have experience with hospice care at home and hospice care in a facility.  Provider choice offered, they choose hosp[facility] of [facility]. ? ?We talked about symptom management and expectations.  We talk about watching Kathryn Morgan for indications that she would need medicines for pain, anxiety, breathlessness.  I shared that most people would rather be mostly asleep and pain-free, Kathryn Morgan immediately states, "yes". ? ?We talk about unburdening Kathryn Morgan for medications that are not changing what is happening.  Patient and family are agreeable to stop medications such as multivitamins and cholesterol meds.  At this point they would like to continue with blood sugar checks. ? ?Multiple in-depth conversations about goals of care, end-of-life care, choices were shared and discussed that were not noted in this progress note. ? ?Conference with attending, bedside nursing staff, transition of care team related to patient condition, needs, goals of care, disposition. ?DNR/goldenrod form completed and placed on chart. ? ?Plan:  Home with the benefits of hospice of [facility] for initially "treat the  treatable" care anticipate transitioning to comfort care within the next days to weeks. ?Prognosis: 3 to 4 weeks or less would be anticipated based on chronic illness burden, metastatic cancer burden, decreased functional status x1 month, frailty, poor by mouth intake. ? ?60 minutes  ?Lillia Carmel, NP ?Palliative medicine team ?Team phone [phone] ?Greater than 50% of this time was spent counseling and coordinating care related to the above assessment and plan. ?

## 2021-09-15 NOTE — Progress Notes (Signed)
?  ?       ?PROGRESS NOTE ? ?DIARY LATNER [id number] DOB: February 10, 1941 DOA: 09/28/2021 ?PCP: Pablo Lawrence, NP ? ?Brief History:  ?81 year old female former smoker with oxygen dependent COPD (3L), secondary cardiomyopathy, history of bioprosthetic aortic valve replacement, type 2 diabetes mellitus, history of necrotizing pancreatitis, status post partial pancreatectomy, osteoporosis, rheumatic fever, GERD, dyslipidemia, CKD presented to the emergency department with chest pain and shortness of breath symptoms.  She has had a difficult time for the past several months with an unexplained iron deficiency anemia.  She has been seen by hematology and has received an iron infusion and was due to have another iron infusion later in the week.  She is due to establish care with Dr. Delton Coombes 09/15/2021.  She reports unexplained weight loss. She has been dealing with a chronic dry mostly nonproductive cough for the past several weeks.  She saw her PCP and they felt that it could be an underlying pneumonia versus recurrent pulmonary edema.  She was treated with a short course of antibiotics recently.  She had no significant improvement or change from treatment.  She reports that she has had increasing malaise and generalized weakness now requiring assistance to get OOB.  She reports having to sit up in a recliner for the past several weeks to sleep due to orthopnea symptoms.  She has been taking her oral Lasix and Entresto regularly.  Of note she reports that she has not had a colonoscopy or upper endoscopy screening done. ? ?Pt was seen in ED and her CXR was concerning for a lung mass.  She was sent for CT chest. It showed bulky bilateral hilar, infrahilar, subcarinal, paratracheal adenopathy in the chest compatible with malignancy. Possibilities could include lymphoproliferative disease or lung cancer.  Dr. Delton Coombes was consulted and said he would see patient in hospital.  CT also showed findings that suggest  underlying lung infection and edema.  She was started on IV antibiotics for pneumonia and admission was requested for further management.   ? ?09/12/21:  pt had some sob this am.  I personally obtained VS:  T98.1 (oral)--114-22-161/76--91-92% on 4L.  Slept well overnight.  Denies f/,c cp, n/v/d. Hemoptysis.  Complains of trouble bring up sputum. ? ?09/13/21:  pt sleeping upon my eval.  States overall sob is improving.  CXR shows bilateral pleural effusions.  Stable at 4L.  Serum creatinine continues to improve ? ?09/14/21:  more sob;  up to 6L.  Give Lasix 40 IV x 1 in light of 09/12/21 CXR findings ? ?09/15/21  continues to have sob on 6L.  Repeat CXR shows vascular congestion and LL consolidations.  Increase lasix to bid.  Palliative discussed with family>>DNR.  Plan d/c home with hospice 4/12 if relatively stable.  ? ? ? ?Assessment and Plan: ?Acute on chronic respiratory failure with hypoxia ([place]) ?-- pt at baseline is on 3L/min supplemental oxygen at home ?-- currently on 6L ?-- multifactorial including COPD, PNA and pleural effusions/fluid overload ?-- CT chest--bulky hilar LN, bilateral LL consolidation with question RLL mass; sm bilateral pleural effusions ?-check D-dimer 1.92 ?4/5 CTA chest--no PE; bulky mediastinal LN, bilateral LL consolidation, 5 cm LLL mass; lytic lesion to R-6th rib ?-add Yupelri and pulmicort ?-hypertonic saline if still difficulty bringing up sputum ?4/8--repeat CXR--personally reviewed--bilateral pleural effusions; vascular congestion ?09/14/21--started lasix IV ?09/15/21--repeat CXR, increase lasix to 40 mg IV bid ? ?Lobar pneumonia ([place]) ?Continue ceftriaxone and azithro ?PCT 0.81>>1.57 ?Finished 7 days abx ? ?Hilar lymphadenopathy ?--follow  up with oncology recommendations ?--pulmonary consult appreciated>>PET, then EBUS ?--appreciate med onc consult>>same plan  ? ?Fluid overload ?4/8 personally reviewed CXR--vascular congestion, pleural effusions ?09/15/21--increase lasix to  bid ?09/15/21-repeat CXR--personally reviewed>>vascular congestion.  Bibasilar opacities ? ?COPD (chronic obstructive pulmonary disease) ([place]) ?--resume home bronchodilators (or hospital substitutions) ?--added Yulperi, pulmicort, xopenex ? ? ?Essential hypertension, benign ?--resumed coreg ? ?DM (diabetes mellitus), type 2 ([place]) ?-- reduced dose semglee ?-- novolog sliding scale ?4/4 A1C--7.7 ?CBGs elevated due to steroids ? ?AKI (acute kidney injury) ([place]) ?Due to contrast nephropathy ?Baseline creatinine 0.6-0.9 ?Serum creatinine peaking 1.69 ?4/8--serum creatinine down to 1.40 ?4/10--overall improving to 1.01 ? ?Goals of care, counseling/discussion ?Changed to DNR toay ?Palliative medicine following ?Plan to d/c home with hospice if stable ? ?Hyperkalemia ?D/c ARB ?-improved with lokelma x 2 and lasix ?-am BMP ? ?Hypercalcemia of malignancy ?Corrected calcium 11.1 ?-given IVF initially ?-intact PTH--30 ?-PTHrp--pending ?-25-vit D--38.25 ?-calcitriol level - 24.0 ? ?Malnutrition of moderate degree ?Started on nutritional supplements ? ?Iron deficiency anemia ?--Pt has been receiving IV iron infusions and Hg up to 10.  ?-- reports never had colon cancer screening  ?-- Dr. Delton Coombes consult appreciated>>no infusion needed at this time ?--iron sat 16%, ferritin 637 ? ?Abnormal weight loss ?--concerning for malignancy ?--working up as noted  ? ?S/P aortic valve replacement ?--bioprosthetic valve 2013 ?--04/06/21 Echo EF 55-60%, normal valve function ? ?Secondary cardiomyopathy ([place]) ?--Pt has been well managed medically with coreg, entresto, lasix ?--04/06/21  Echo with EF 55-60% with grade 1 DD and replaced aortic valve (October 2022) ?--pulm recommends d/c entresto ? ?Mixed hyperlipidemia ?--resume home statin therapy  ? ? ?Status is: Inpatient ?Remains inpatient appropriate because: severity of illness requiring IV abx and IVF.  Also increased oxygen demands ?  ? ? ?Family Communication:   spouse and DTR at  bedside 4/11 ? ?Consultants:  palliative, pulm ? ?Code Status:  DNR ? ?DVT Prophylaxis:   [place] Lovenox ? ? ?Procedures: ?As Listed in Progress Note Above ? ?Antibiotics: ?Ceftriaxone 4/4>>4/8 ?Azithro 4/4>>4/11 ?Cefdinir 4/9>>4/11 ? ? ? ? ?Subjective: ?Pt complains of sob and cough.  Denies f/c, n/v/d, abd pain.  Has unchanged substernal chest pressure, unchanged ? ?Objective: ?Vitals:  ? 09/14/21 1516 09/14/21 1957 09/14/21 2001 09/15/21 0510  ?BP: 133/82 (!) 151/72  (!) 141/61  ?Pulse: 93 (!) 109  (!) 103  ?Resp: 18 18  17   ?Temp: 97.9 ?F (36.6 ?C) 98.9 ?F (37.2 ?C)  97.6 ?F (36.4 ?C)  ?TempSrc: Oral   Oral  ?SpO2: 96% 95% 93% 95%  ?Weight:    77 kg  ?Height:      ? ? ?Intake/Output Summary (Last 24 hours) at 09/15/2021 1200 ?Last data filed at 09/15/2021 0500 ?Gross per 24 hour  ?Intake 480 ml  ?Output 1500 ml  ?Net -1020 ml  ? ?Weight change: -9.1 kg ?Exam: ? ?General:  Pt is alert, follows commands appropriately, not in acute distress ?HEENT: No icterus, No thrush, No neck mass, [place]/AT ?Cardiovascular: RRR, S1/S2, no rubs, no gallops ?Respiratory: bibasilar crackles. No wheeze.  Diiminshed BS ?Abdomen: Soft/+BS, non tender, non distended, no guarding ?Extremities: No edema, No lymphangitis, No petechiae, No rashes, no synovitis ? ? ?Data Reviewed: ?I have personally reviewed following labs and imaging studies ?Basic Metabolic Panel: ?Recent Labs  ?Lab 09/09/21 ?[id number] 09/10/21 ?[id number] 09/11/21 ?[id number] 09/11/21 ?1402 09/12/21 ?[id number] 09/13/21 ?[id number] 09/14/21 ?[id number] 09/15/21 ?[id number]  ?NA 133* 132* 131* 130* 134* 136 137 140  ?K 4.9 5.5* 5.7* 5.0 5.0 4.8 4.9  4.6  ?CL 85* 87* 87* 87* 90* 94* 95* 95*  ?CO2 39* 35* 34* 34* 33* 35* 36* 37*  ?GLUCOSE 183* 289* 228* 202* 175* 179* 217* 114*  ?BUN 28* 36* 48* 53* 56* 58* 54* 50*  ?CREATININE 0.88 1.29* 1.62* 1.68* 1.40* 1.23* 1.01* 0.84  ?CALCIUM 10.2 10.0 9.8 9.7 10.1 9.7 10.0 10.2  ?MG 1.6* 2.2 2.2  --  2.3  --   --   --   ?PHOS  --   --   --   --   --  4.0  --   --   ? ?Liver Function  Tests: ?Recent Labs  ?Lab 09/09/21 ?[id number] 09/10/21 ?[id number] 09/11/21 ?[id number] 09/12/21 ?0440  ?AST 14* 13* 13* 14*  ?ALT 13 13 13 13   ?ALKPHOS 62 86 79 76  ?BILITOT 0.4 0.7 0.5 0.4  ?PROT 6.5 6.3* 6.1* 6.2*  ?ALBUMIN 2.6* 2.6* 2.5* 2.4

## 2021-09-18 LAB — PTH-RELATED PEPTIDE: PTH-related peptide: <2 pmol/L

## 2021-10-05 NOTE — Plan of Care (Signed)
?  Problem: Activity: ?Goal: Ability to tolerate increased activity will improve ?Outcome: Not Progressing ?  ?Problem: Clinical Measurements: ?Goal: Ability to maintain a body temperature in the normal range will improve ?Outcome: Progressing ?  ?Problem: Respiratory: ?Goal: Ability to maintain adequate ventilation will improve ?Outcome: Progressing ?Goal: Ability to maintain a clear airway will improve ?Outcome: Progressing ?  ?Problem: Education: ?Goal: Knowledge of General Education information will improve ?Description: Including pain rating scale, medication(s)/side effects and non-pharmacologic comfort measures ?Outcome: Not Progressing ?  ?Problem: Health Behavior/Discharge Planning: ?Goal: Ability to manage health-related needs will improve ?Outcome: Not Progressing ?  ?Problem: Clinical Measurements: ?Goal: Ability to maintain clinical measurements within normal limits will improve ?Outcome: Progressing ?Goal: Will remain free from infection ?Outcome: Progressing ?Goal: Diagnostic test results will improve ?Outcome: Progressing ?Goal: Respiratory complications will improve ?Outcome: Progressing ?Goal: Cardiovascular complication will be avoided ?Outcome: Progressing ?  ?Problem: Activity: ?Goal: Risk for activity intolerance will decrease ?Outcome: Not Progressing ?  ?Problem: Nutrition: ?Goal: Adequate nutrition will be maintained ?Outcome: Not Progressing ?  ?Problem: Pain Managment: ?Goal: General experience of comfort will improve ?Outcome: Progressing ?  ?Problem: Safety: ?Goal: Ability to remain free from injury will improve ?Outcome: Progressing ?  ?Problem: Skin Integrity: ?Goal: Risk for impaired skin integrity will decrease ?Outcome: Progressing ?  ?

## 2021-10-05 NOTE — Progress Notes (Signed)
She had a restful night with no s/s of pain or distress. Pt passed around 6 am in her sleep with her spouse at bedside. MD on call was notified ?

## 2021-10-05 NOTE — Progress Notes (Signed)
Pt refused her medications during this shift. Md on call was notified  ?

## 2021-10-05 NOTE — Death Summary Note (Addendum)
? ?DEATH SUMMARY  ? ?Patient Details  ?Name: Kathryn Morgan ?MRN: [id number] ?DOB: 1941/03/26 ?GYB:WLSLHT, Toni Amend, NP ?Admission/Discharge Information  ? ?Admit Date:  15-Sep-2021  ?Date of Death: Date of Death: 09/23/21  ?Time of Death: Time of Death: 0600  ?Length of Stay: 8  ? ?Principle Cause of death: Acute on chronic respiratory failure with hypoxia ? ?Hospital Diagnoses: ?Active Problems: ?  Acute on chronic respiratory failure with hypoxia (HCC) ?  Lobar pneumonia (HCC) ?  Hilar lymphadenopathy ?  Fluid overload ?  COPD (chronic obstructive pulmonary disease) (HCC) ?  Essential hypertension, benign ?  DM (diabetes mellitus), type 2 (HCC) ?  AKI (acute kidney injury) (HCC) ?  Mixed hyperlipidemia ?  Secondary cardiomyopathy (HCC) ?  S/P aortic valve replacement ?  Abnormal weight loss ?  Iron deficiency anemia ?  Malnutrition of moderate degree ?  Hypercalcemia of malignancy ?  Hyperkalemia ?  Goals of care, counseling/discussion ?  Acute on chronic diastolic congestive heart failure ?  Hypomagnesemia ? ?Hospital Course: ?81 year old female with a prior history of tobacco use, COPD on 3 L of oxygen, diabetes, bioprosthetic aortic valve replacement, presents to the emergency room with weight loss, iron deficiency anemia, and dry nonproductive cough for several weeks prior to admission.  Chest x-ray revealed possible lung mass which was also confirmed on CT chest.  She was also noted to have some evidence of volume overload.  Patient was started on intravenous Lasix with some improvement of respiratory status.  She was seen by pulmonology as well as oncology with plans for outpatient PET/CT followed by bronchoscopy for biopsy.  She was seen by palliative care who discussed goals of care.  Family/patient agreed to DNR.  Upon further conversations, the family did eventually decide to discharge home with hospice services.  Unfortunately, her overall condition declined while she was in the hospital.  The patient  ended up passing away at the hospital, her husband was at the bedside. ? ? ? ? ? ?  ? ? ?Procedures:  ? ?Consultations: Oncology, pulmonology, palliative care ? ?The results of significant diagnostics from this hospitalization (including imaging, microbiology, ancillary and laboratory) are listed below for reference.  ? ?Significant Diagnostic Studies: ?DG Chest 2 View ? ?Result Date: 08/28/2021 ?CLINICAL DATA:  Hypoxia, dyspnea EXAM: CHEST - 2 VIEW COMPARISON:  10/05/2016 chest radiograph. FINDINGS: Intact sternotomy wires. Aortic valve prosthesis in place. Cholecystectomy clips are seen in the right upper quadrant of the abdomen. Stable cardiomediastinal silhouette with mild cardiomegaly. No pneumothorax. Small bilateral pleural effusions. Hazy patchy bilateral mid to lower lung opacities. IMPRESSION: 1. Stable mild cardiomegaly. Nonspecific hazy patchy bilateral mid to lower lung opacities, differential includes acute pulmonary edema versus aspiration or pneumonia. Follow-up chest radiographs advised. 2. Small bilateral pleural effusions. Electronically Signed   By: Delbert Phenix M.D.   On: 08/28/2021 14:08  ? ?CT Chest W Contrast ? ?Result Date: September 15, 2021 ?CLINICAL DATA:  Chest pain, congestive heart failure possible pneumonia. * Tracking Code: BO * EXAM: CT CHEST WITH CONTRAST TECHNIQUE: Multidetector CT imaging of the chest was performed during intravenous contrast administration. RADIATION DOSE REDUCTION: This exam was performed according to the departmental dose-optimization program which includes automated exposure control, adjustment of the mA and/or kV according to patient size and/or use of iterative reconstruction technique. CONTRAST:  45mL OMNIPAQUE IOHEXOL 300 MG/ML  SOLN COMPARISON:  2021-09-15 radiographs and prior CT chest from 07/31/2012 FINDINGS: Cardiovascular: Heterogeneity in some of the pulmonary arterial branches, example the left upper  lobe pulmonary arterial branch on image 44 series 2. This  could be incidental but pulmonary embolus is not excluded on today's non-angiogram CT. Mild cardiomegaly. Aortic valve prosthesis. Coronary, aortic arch, and branch vessel atherosclerotic vascular disease. Mediastinum/Nodes: Pathologic right hilar adenopathy, 2.4 cm in short axis on image 57 series 2. Pathologic subcarinal adenopathy, 3.0 cm in short axis on image 580 Reese 2. Pathologic right infrahilar adenopathy at about 1.7 cm in short axis on image 72 series 2. Pathologic left hilar adenopathy, 2.3 cm in short axis on image 63 series 2. Right paratracheal node mildly prominent at 1.1 cm in short axis on image 38 series 2. A lymph node anterior to the carina measures 1.8 cm in short axis on image 9 series 2. Suspected left infrahilar adenopathy. Conglomerate adenopathy surrounds the carina, right mainstem bronchus, the right upper lobe bronchus and the bronchus intermedius. Lungs/Pleura: Substantial consolidation of the vast majority of the lower lobes. A lower lobe mask can thus not be excluded on either side although I do not see definite central airway occlusion. In particular there is some hypodensity in the superior segment right lower lobe which could be cavitary pneumonia or possibly a centrally necrotic mass. Small bilateral pleural effusions are present, nonspecific for malignant involvement. Mild airway thickening noted centrally. There is some scattered nodularity including a 1.1 by 0.9 cm nodule anteriorly in the right lower lobe on image 65 series 4, a 3 mm right upper lobe nodule on image 36 series 4, a 6 by 3 mm left upper lobe nodule laterally on image 24 series 4, several other similar small scattered nodules. Upper Abdomen: Nonspecific hypodense lesions in the liver and spleen. Index liver lesion in the dome of the liver 1.5 by 1.4 cm on image 88 series 4, index splenic lesion 1.4 by 1.1 cm on image 119 series 2. Nonspecific left adrenal 2.5 by 1.4 cm on image 130 series 2. Nonspecific right  adrenal mass 0.6 by 1.1 cm on image 131 series 2. Upper abdominal left periaortic lymph node 0 9 cm in short axis on image 140 series 2. Dense atherosclerotic calcification of the abdominal aorta with plaque in the celiac trunk and SMA as well. Musculoskeletal: Prior median sternotomy. Thoracic spondylosis and kyphosis. IMPRESSION: 1. Bulky bilateral hilar, infrahilar, subcarinal, paratracheal adenopathy in the chest compatible with malignancy. Possibilities could include lymphoproliferative disease or lung cancer. Sarcoidosis is a less likely differential diagnostic consideration. 2. Consolidation of most of both lower lobes. Some of this may be a drowned lung appearance or pneumonia, lower lobe mass is not excluded particularly on the right side where there is some hypodensity in the superior segment which could reflect a centrally necrotic mass. 3. Hypodense hepatic and splenic lesions may reflect malignancy but are technically nonspecific. There are also small masses of both adrenal glands and a borderline enlarged periaortic lymph node in the upper abdomen. 4. Small bilateral pleural effusions. 5. Cardiomegaly with coronary, aortic, and branch vessel atherosclerosis as well as substantial systemic atherosclerosis including involvement of the celiac and SMA. Prosthetic aortic valve noted. 6. There is enough heterogeneity in the pulmonary arteries to where pulmonary embolus is considered indeterminate on today's exam. Today's exam was not performed as a CT angiogram. Electronically Signed   By: Gaylyn Rong M.D.   On: Sep 20, 2021 11:29  ? ?CT Angio Chest Pulmonary Embolism (PE) W or WO Contrast ? ?Result Date: 09/09/2021 ?CLINICAL DATA:  Chest pain and positive D-dimer. EXAM: CT ANGIOGRAPHY CHEST WITH CONTRAST TECHNIQUE:  Multidetector CT imaging of the chest was performed using the standard protocol during bolus administration of intravenous contrast. Multiplanar CT image reconstructions and MIPs were obtained  to evaluate the vascular anatomy. RADIATION DOSE REDUCTION: This exam was performed according to the departmental dose-optimization program which includes automated exposure control, adjustment of the mA

## 2021-10-05 DEATH — deceased

## 2021-10-15 ENCOUNTER — Ambulatory Visit (INDEPENDENT_AMBULATORY_CARE_PROVIDER_SITE_OTHER): Payer: Medicare Other | Admitting: Gastroenterology

## 2021-12-03 ENCOUNTER — Ambulatory Visit: Payer: Medicare Other | Admitting: Cardiology
